# Patient Record
Sex: Male | Born: 1955 | Race: White | Hispanic: No | Marital: Married | Smoking: Never smoker
Health system: Southern US, Community
[De-identification: ages and names within clinical notes are randomized; demographics above are authoritative.]

## PROBLEM LIST (undated history)

## (undated) DIAGNOSIS — R413 Other amnesia: Secondary | ICD-10-CM

## (undated) DIAGNOSIS — K219 Gastro-esophageal reflux disease without esophagitis: Secondary | ICD-10-CM

## (undated) DIAGNOSIS — Z86010 Personal history of colonic polyps: Secondary | ICD-10-CM

## (undated) DIAGNOSIS — I2510 Atherosclerotic heart disease of native coronary artery without angina pectoris: Secondary | ICD-10-CM

## (undated) DIAGNOSIS — E785 Hyperlipidemia, unspecified: Secondary | ICD-10-CM

## (undated) DIAGNOSIS — I10 Essential (primary) hypertension: Secondary | ICD-10-CM

## (undated) DIAGNOSIS — K222 Esophageal obstruction: Secondary | ICD-10-CM

## (undated) DIAGNOSIS — E039 Hypothyroidism, unspecified: Secondary | ICD-10-CM

## (undated) DIAGNOSIS — K5732 Diverticulitis of large intestine without perforation or abscess without bleeding: Secondary | ICD-10-CM

## (undated) DIAGNOSIS — Z86718 Personal history of other venous thrombosis and embolism: Secondary | ICD-10-CM

## (undated) DIAGNOSIS — K4091 Unilateral inguinal hernia, without obstruction or gangrene, recurrent: Secondary | ICD-10-CM

## (undated) DIAGNOSIS — F411 Generalized anxiety disorder: Secondary | ICD-10-CM

## (undated) HISTORY — DX: Esophageal obstruction: K22.2

## (undated) HISTORY — PX: UPPER GASTROINTESTINAL ENDOSCOPY: SHX188

## (undated) HISTORY — PX: POLYPECTOMY: SHX149

## (undated) HISTORY — PX: COLONOSCOPY: SHX174

## (undated) HISTORY — DX: Personal history of other venous thrombosis and embolism: Z86.718

## (undated) HISTORY — PX: HERNIA REPAIR: SHX51

## (undated) HISTORY — DX: Other amnesia: R41.3

## (undated) HISTORY — DX: Hyperlipidemia, unspecified: E78.5

## (undated) HISTORY — DX: Gastro-esophageal reflux disease without esophagitis: K21.9

## (undated) HISTORY — DX: Hypothyroidism, unspecified: E03.9

## (undated) HISTORY — DX: Personal history of colonic polyps: Z86.010

## (undated) HISTORY — PX: PILONIDAL CYST EXCISION: SHX744

## (undated) HISTORY — DX: Diverticulitis of large intestine without perforation or abscess without bleeding: K57.32

## (undated) HISTORY — DX: Essential (primary) hypertension: I10

## (undated) HISTORY — PX: CORONARY ANGIOPLASTY WITH STENT PLACEMENT: SHX49

## (undated) HISTORY — DX: Generalized anxiety disorder: F41.1

## (undated) HISTORY — DX: Atherosclerotic heart disease of native coronary artery without angina pectoris: I25.10

---

## 2002-10-10 ENCOUNTER — Emergency Department (HOSPITAL_COMMUNITY): Admission: EM | Admit: 2002-10-10 | Discharge: 2002-10-10 | Payer: Self-pay | Admitting: Emergency Medicine

## 2003-02-08 HISTORY — PX: CORONARY ANGIOPLASTY WITH STENT PLACEMENT: SHX49

## 2003-10-03 ENCOUNTER — Emergency Department (HOSPITAL_COMMUNITY): Admission: EM | Admit: 2003-10-03 | Discharge: 2003-10-03 | Payer: Self-pay | Admitting: *Deleted

## 2003-10-21 ENCOUNTER — Inpatient Hospital Stay (HOSPITAL_COMMUNITY): Admission: EM | Admit: 2003-10-21 | Discharge: 2003-10-24 | Payer: Self-pay | Admitting: *Deleted

## 2003-10-26 ENCOUNTER — Observation Stay (HOSPITAL_COMMUNITY): Admission: EM | Admit: 2003-10-26 | Discharge: 2003-10-27 | Payer: Self-pay | Admitting: Emergency Medicine

## 2003-11-05 ENCOUNTER — Inpatient Hospital Stay (HOSPITAL_COMMUNITY): Admission: EM | Admit: 2003-11-05 | Discharge: 2003-11-08 | Payer: Self-pay | Admitting: Emergency Medicine

## 2003-11-27 ENCOUNTER — Encounter: Admission: RE | Admit: 2003-11-27 | Discharge: 2003-11-27 | Payer: Self-pay | Admitting: Internal Medicine

## 2003-11-28 ENCOUNTER — Ambulatory Visit (HOSPITAL_COMMUNITY): Admission: RE | Admit: 2003-11-28 | Discharge: 2003-11-28 | Payer: Self-pay | Admitting: Internal Medicine

## 2004-01-26 ENCOUNTER — Encounter: Admission: RE | Admit: 2004-01-26 | Discharge: 2004-01-26 | Payer: Self-pay | Admitting: Internal Medicine

## 2004-06-25 ENCOUNTER — Ambulatory Visit: Payer: Self-pay | Admitting: Internal Medicine

## 2004-07-02 ENCOUNTER — Ambulatory Visit: Payer: Self-pay

## 2004-09-23 ENCOUNTER — Ambulatory Visit: Payer: Self-pay | Admitting: Internal Medicine

## 2004-11-02 ENCOUNTER — Ambulatory Visit: Payer: Self-pay | Admitting: Internal Medicine

## 2004-11-09 ENCOUNTER — Ambulatory Visit: Payer: Self-pay | Admitting: Internal Medicine

## 2005-02-10 ENCOUNTER — Ambulatory Visit: Payer: Self-pay | Admitting: Internal Medicine

## 2005-06-08 ENCOUNTER — Ambulatory Visit: Payer: Self-pay | Admitting: Internal Medicine

## 2005-06-14 ENCOUNTER — Ambulatory Visit: Payer: Self-pay

## 2005-06-23 ENCOUNTER — Ambulatory Visit: Payer: Self-pay | Admitting: Internal Medicine

## 2005-07-25 ENCOUNTER — Ambulatory Visit: Payer: Self-pay | Admitting: Internal Medicine

## 2005-08-02 ENCOUNTER — Ambulatory Visit: Payer: Self-pay | Admitting: Internal Medicine

## 2005-09-07 ENCOUNTER — Ambulatory Visit: Payer: Self-pay | Admitting: Internal Medicine

## 2005-10-07 ENCOUNTER — Ambulatory Visit: Payer: Self-pay | Admitting: Internal Medicine

## 2005-10-13 ENCOUNTER — Ambulatory Visit: Payer: Self-pay | Admitting: Internal Medicine

## 2006-03-09 ENCOUNTER — Ambulatory Visit: Payer: Self-pay | Admitting: Internal Medicine

## 2006-06-14 ENCOUNTER — Ambulatory Visit: Payer: Self-pay | Admitting: Internal Medicine

## 2006-07-31 ENCOUNTER — Ambulatory Visit: Payer: Self-pay | Admitting: Internal Medicine

## 2006-08-02 ENCOUNTER — Encounter: Payer: Self-pay | Admitting: Internal Medicine

## 2006-08-02 DIAGNOSIS — K219 Gastro-esophageal reflux disease without esophagitis: Secondary | ICD-10-CM

## 2006-08-02 DIAGNOSIS — Z86718 Personal history of other venous thrombosis and embolism: Secondary | ICD-10-CM | POA: Insufficient documentation

## 2006-08-02 DIAGNOSIS — I2510 Atherosclerotic heart disease of native coronary artery without angina pectoris: Secondary | ICD-10-CM

## 2006-08-02 DIAGNOSIS — E785 Hyperlipidemia, unspecified: Secondary | ICD-10-CM

## 2006-08-02 DIAGNOSIS — E782 Mixed hyperlipidemia: Secondary | ICD-10-CM

## 2006-08-02 DIAGNOSIS — I25810 Atherosclerosis of coronary artery bypass graft(s) without angina pectoris: Secondary | ICD-10-CM

## 2006-08-02 HISTORY — DX: Gastro-esophageal reflux disease without esophagitis: K21.9

## 2006-08-02 HISTORY — DX: Personal history of other venous thrombosis and embolism: Z86.718

## 2006-08-02 HISTORY — DX: Atherosclerotic heart disease of native coronary artery without angina pectoris: I25.10

## 2006-08-02 HISTORY — DX: Hyperlipidemia, unspecified: E78.5

## 2006-09-11 ENCOUNTER — Encounter: Payer: Self-pay | Admitting: Internal Medicine

## 2006-11-01 ENCOUNTER — Ambulatory Visit: Payer: Self-pay | Admitting: Internal Medicine

## 2006-11-01 DIAGNOSIS — F411 Generalized anxiety disorder: Secondary | ICD-10-CM

## 2006-11-01 HISTORY — DX: Generalized anxiety disorder: F41.1

## 2006-12-01 ENCOUNTER — Ambulatory Visit: Payer: Self-pay | Admitting: Internal Medicine

## 2006-12-21 ENCOUNTER — Ambulatory Visit: Payer: Self-pay | Admitting: Cardiology

## 2006-12-21 ENCOUNTER — Observation Stay (HOSPITAL_COMMUNITY): Admission: EM | Admit: 2006-12-21 | Discharge: 2006-12-21 | Payer: Self-pay | Admitting: Emergency Medicine

## 2006-12-22 ENCOUNTER — Emergency Department (HOSPITAL_COMMUNITY): Admission: EM | Admit: 2006-12-22 | Discharge: 2006-12-23 | Payer: Self-pay | Admitting: Emergency Medicine

## 2006-12-27 ENCOUNTER — Ambulatory Visit: Payer: Self-pay

## 2007-01-05 ENCOUNTER — Ambulatory Visit: Payer: Self-pay | Admitting: Internal Medicine

## 2007-05-02 ENCOUNTER — Telehealth: Payer: Self-pay | Admitting: Internal Medicine

## 2007-05-03 ENCOUNTER — Telehealth: Payer: Self-pay | Admitting: Internal Medicine

## 2007-05-03 ENCOUNTER — Ambulatory Visit: Payer: Self-pay | Admitting: Internal Medicine

## 2007-12-03 ENCOUNTER — Telehealth: Payer: Self-pay | Admitting: Internal Medicine

## 2007-12-25 ENCOUNTER — Ambulatory Visit: Payer: Self-pay | Admitting: Internal Medicine

## 2007-12-25 DIAGNOSIS — F411 Generalized anxiety disorder: Secondary | ICD-10-CM

## 2007-12-25 HISTORY — DX: Generalized anxiety disorder: F41.1

## 2008-01-08 ENCOUNTER — Ambulatory Visit: Payer: Self-pay | Admitting: Internal Medicine

## 2008-08-05 ENCOUNTER — Ambulatory Visit: Payer: Self-pay | Admitting: Internal Medicine

## 2008-08-05 DIAGNOSIS — I10 Essential (primary) hypertension: Secondary | ICD-10-CM

## 2008-08-05 HISTORY — DX: Essential (primary) hypertension: I10

## 2009-03-04 ENCOUNTER — Ambulatory Visit: Payer: Self-pay | Admitting: Family Medicine

## 2009-03-11 ENCOUNTER — Observation Stay (HOSPITAL_COMMUNITY): Admission: EM | Admit: 2009-03-11 | Discharge: 2009-03-12 | Payer: Self-pay | Admitting: Emergency Medicine

## 2009-03-13 ENCOUNTER — Ambulatory Visit: Payer: Self-pay | Admitting: Internal Medicine

## 2009-03-13 DIAGNOSIS — M549 Dorsalgia, unspecified: Secondary | ICD-10-CM

## 2009-03-30 ENCOUNTER — Ambulatory Visit: Payer: Self-pay | Admitting: Internal Medicine

## 2009-05-12 ENCOUNTER — Telehealth: Payer: Self-pay | Admitting: Internal Medicine

## 2009-06-12 ENCOUNTER — Telehealth: Payer: Self-pay | Admitting: Internal Medicine

## 2009-07-13 ENCOUNTER — Telehealth: Payer: Self-pay | Admitting: Internal Medicine

## 2009-07-21 ENCOUNTER — Ambulatory Visit: Payer: Self-pay | Admitting: Family Medicine

## 2009-07-21 DIAGNOSIS — R413 Other amnesia: Secondary | ICD-10-CM

## 2009-07-21 HISTORY — DX: Other amnesia: R41.3

## 2009-07-21 LAB — CONVERTED CEMR LAB
ALT: 16 units/L (ref 0–53)
AST: 17 units/L (ref 0–37)
Albumin: 4.5 g/dL (ref 3.5–5.2)
Alkaline Phosphatase: 56 units/L (ref 39–117)
BUN: 22 mg/dL (ref 6–23)
Basophils Absolute: 0 10*3/uL (ref 0.0–0.1)
Basophils Relative: 0.2 % (ref 0.0–3.0)
Bilirubin, Direct: 0.1 mg/dL (ref 0.0–0.3)
CO2: 27 meq/L (ref 19–32)
Calcium: 9.8 mg/dL (ref 8.4–10.5)
Chloride: 107 meq/L (ref 96–112)
Creatinine, Ser: 1.4 mg/dL (ref 0.4–1.5)
Eosinophils Absolute: 0.1 10*3/uL (ref 0.0–0.7)
Eosinophils Relative: 0.9 % (ref 0.0–5.0)
GFR calc non Af Amer: 54.9 mL/min (ref >60)
Glucose, Bld: 76 mg/dL (ref 70–99)
HCT: 47.3 % (ref 39.0–52.0)
Hemoglobin: 16.3 g/dL (ref 13.0–17.0)
Lymphocytes Relative: 17.8 % (ref 12.0–46.0)
Lymphs Abs: 1.6 10*3/uL (ref 0.7–4.0)
MCHC: 34.5 g/dL (ref 30.0–36.0)
MCV: 93.1 fL (ref 78.0–100.0)
Monocytes Absolute: 0.6 10*3/uL (ref 0.1–1.0)
Monocytes Relative: 6.9 % (ref 3.0–12.0)
Neutro Abs: 6.8 10*3/uL (ref 1.4–7.7)
Neutrophils Relative %: 74.2 % (ref 43.0–77.0)
Phosphorus: 3.5 mg/dL (ref 2.3–4.6)
Platelets: 305 10*3/uL (ref 150.0–400.0)
Potassium: 4.6 meq/L (ref 3.5–5.1)
RBC: 5.08 M/uL (ref 4.22–5.81)
RDW: 13.8 % (ref 11.5–14.6)
Sed Rate: 10 mm/hr (ref 0–22)
Sodium: 145 meq/L (ref 135–145)
TSH: 1.18 microintl units/mL (ref 0.35–5.50)
Total Bilirubin: 0.4 mg/dL (ref 0.3–1.2)
Total Protein: 7.4 g/dL (ref 6.0–8.3)
WBC: 9.2 10*3/uL (ref 4.5–10.5)

## 2010-01-06 ENCOUNTER — Ambulatory Visit: Payer: Self-pay | Admitting: Family Medicine

## 2010-01-06 ENCOUNTER — Telehealth: Payer: Self-pay

## 2010-01-06 DIAGNOSIS — K5732 Diverticulitis of large intestine without perforation or abscess without bleeding: Secondary | ICD-10-CM

## 2010-01-06 HISTORY — DX: Diverticulitis of large intestine without perforation or abscess without bleeding: K57.32

## 2010-01-06 LAB — CONVERTED CEMR LAB
Bilirubin Urine: NEGATIVE
Blood in Urine, dipstick: NEGATIVE
Glucose, Urine, Semiquant: NEGATIVE
Nitrite: NEGATIVE
Specific Gravity, Urine: 1.015
Urobilinogen, UA: 0.2
WBC Urine, dipstick: NEGATIVE
pH: 6.5

## 2010-01-07 LAB — CONVERTED CEMR LAB
ALT: 20 units/L (ref 0–53)
AST: 18 units/L (ref 0–37)
Albumin: 4.3 g/dL (ref 3.5–5.2)
Alkaline Phosphatase: 52 units/L (ref 39–117)
Amylase: 21 units/L — ABNORMAL LOW (ref 27–131)
BUN: 18 mg/dL (ref 6–23)
Basophils Absolute: 0 10*3/uL (ref 0.0–0.1)
Basophils Relative: 0.3 % (ref 0.0–3.0)
Bilirubin, Direct: 0.1 mg/dL (ref 0.0–0.3)
CO2: 26 meq/L (ref 19–32)
Calcium: 9.7 mg/dL (ref 8.4–10.5)
Chloride: 106 meq/L (ref 96–112)
Creatinine, Ser: 1.3 mg/dL (ref 0.4–1.5)
Eosinophils Absolute: 0.1 10*3/uL (ref 0.0–0.7)
Eosinophils Relative: 0.7 % (ref 0.0–5.0)
GFR calc non Af Amer: 61.78 mL/min (ref >60)
Glucose, Bld: 75 mg/dL (ref 70–99)
HCT: 44.7 % (ref 39.0–52.0)
Hemoglobin: 15.7 g/dL (ref 13.0–17.0)
Lymphocytes Relative: 14.6 % (ref 12.0–46.0)
Lymphs Abs: 1.4 10*3/uL (ref 0.7–4.0)
MCHC: 35.1 g/dL (ref 30.0–36.0)
MCV: 92.1 fL (ref 78.0–100.0)
Monocytes Absolute: 0.7 10*3/uL (ref 0.1–1.0)
Monocytes Relative: 7.2 % (ref 3.0–12.0)
Neutro Abs: 7.3 10*3/uL (ref 1.4–7.7)
Neutrophils Relative %: 77.2 % — ABNORMAL HIGH (ref 43.0–77.0)
Platelets: 408 10*3/uL — ABNORMAL HIGH (ref 150.0–400.0)
Potassium: 5.3 meq/L — ABNORMAL HIGH (ref 3.5–5.1)
RBC: 4.86 M/uL (ref 4.22–5.81)
RDW: 13.7 % (ref 11.5–14.6)
Sodium: 143 meq/L (ref 135–145)
Total Bilirubin: 0.6 mg/dL (ref 0.3–1.2)
Total Protein: 7.5 g/dL (ref 6.0–8.3)
WBC: 9.4 10*3/uL (ref 4.5–10.5)

## 2010-02-09 ENCOUNTER — Encounter (INDEPENDENT_AMBULATORY_CARE_PROVIDER_SITE_OTHER): Payer: Self-pay | Admitting: *Deleted

## 2010-02-28 ENCOUNTER — Encounter: Payer: Self-pay | Admitting: Family Medicine

## 2010-03-09 NOTE — Assessment & Plan Note (Signed)
Summary: eph/per aft hrs sheet/jss  Medications Added SIMVASTATIN 40 MG  TABS (SIMVASTATIN) 1 tab once daily LISINOPRIL 10 MG TABS (LISINOPRIL) as needed pt monitors his own blood pressure ASPIRIN EC 325 MG TBEC (ASPIRIN) Take one tablet by mouth daily      Allergies Added:   Visit Type:  Follow-up Primary Provider:  Gordy Savers  MD  CC:  no complaints.  History of Present Illness: Patient is a 55 year old with a history of CAD (s/p catheterization in 2005 with DES to occluded RCA).  Myoview in 2008 without ischemia. The patient was last in clinic a couple years ago.  He was recently admitted to [facility] with chest pan. the patient said he had not been feeling well the day before admit.  He had helped his boss move a TV into his home.  He went to bed early after a light dinner.  About 1 AM  he was still fidgety.  Ge becam dizzy, shrot of breath.  Laid down on the floor.  Becam very dizzy  Developed L sided chest pressure.  His wife called EMS.  When they came he says his blood pressure was 60/. He was taken to the hospital.  ER records were not obtainable.  He was aditted.  Ruled out for MI. Pain in his chest was felt to be musculoskeletal.  He was d/cd home on lisinopril and on Zocor (he had stopped meds;  took lisinopril as needed for increased bp) Since d/c he denies chest pain.  Breathing is ok.  Current Medications (verified): 1)  Prilosec Otc 20 Mg  Tbec (Omeprazole Magnesium) .Marland Kitchen.. 1qd 2)  Lorazepam 0.5 Mg  Tabs (Lorazepam) .... One Every 6 Hours As Needed For Anxiety 3)  Simvastatin 40 Mg  Tabs (Simvastatin) .Marland Kitchen.. 1 Tab Once Daily 4)  Lisinopril 10 Mg Tabs (Lisinopril) .... As Needed Pt Monitors His Own Blood Pressure 5)  Aspirin Ec 325 Mg Tbec (Aspirin) .... Take One Tablet By Mouth Daily  Allergies (verified): 1)  Penicillin V Potassium (Penicillin V Potassium)  Past History:  Past Medical History: Last updated: 08/05/2008 Coronary artery disease DVT, hx  of Hyperlipidemia GERD Anxiety Hypertension  Family History: Last updated: November 04, 2006 father died age 87 colon cancer.  mother that her mid 20s of dementia.  Two half-brothers one sister in good health  Social History: Last updated: 12/25/2007 Married  Vital Signs:  Patient profile:   55 year old male Height:      70 inches Weight:      205 pounds BMI:     29.52 Pulse rate:   85 / minute BP sitting:   147 / 97  (left arm) Cuff size:   regular  Vitals Entered By: Burnett Kanaris, CNA (March 30, 2009 11:17 AM)  Physical Exam  Additional Exam:  patient is in NAD HEENT:  Normocephalic, atraumatic. EOMI, PERRLA.  Neck: JVP is normal. No thyromegaly. No bruits.  Lungs: clear to auscultation. No rales no wheezes.  Heart: Regular rate and rhythm. Normal S1, S2. No S3.   No significant murmurs. PMI not displaced.  Abdomen:  Supple, nontender. Normal bowel sounds. No masses. No hepatomegaly.  Extremities:   Good distal pulses throughout. No lower extremity edema.  Musculoskeletal :moving all extremities.  Neuro:   alert and oriented x3.    EKG  Procedure date:  03/30/2009  Findings:      NSR.  70 bpm.  Impression & Recommendations:  Problem # 1:  CORONARY ARTERY  DISEASE (ICD-414.00)  I do not think the recent episode of chest pain represents ischemia. I would continue on medical Rx.  Problem # 2:  HYPERLIPIDEMIA (ICD-272.4) Patient needs to be on a statin.  Continue simvistatin.  Check lipids in 8 wls. His updated medication list for this problem includes:    Simvastatin 40 Mg Tabs (Simvastatin) .Marland Kitchen... 1 tab once daily  Problem # 3:  HYPERTENSION (ICD-401.9) BP is labile   He takes lisinopril as needed.  Has had problems with hypotension at times. I would recomm low dose Toprol.  I have asked him to send in his bp readings in about one month.  Other Orders: EKG w/ Interpretation (93000)  Patient Instructions: 1)  Your physician recommends that you return for a  FASTING lipid profile: Schedule for Fasting Lipid and AST 272.0  in April 2011 2)  Your physician recommends that you schedule a follow-up appointment in: Nurse visit for BP check..same day as Lab work 3)  Your physician wants you to follow-up in:  December 2011 You will receive a reminder letter in the mail two months in advance. If you don't receive a letter, please call our office to schedule the follow-up appointment.

## 2010-03-09 NOTE — Assessment & Plan Note (Signed)
Summary: LOWER BACK PAIN/OUT OF HOSP THURS/RCD   Vital Signs:  Patient profile:   55 year old male Weight:      191 pounds Temp:     99.3 degrees F oral BP sitting:   120 / 80  (left arm) Cuff size:   regular CC: dc'd 2/3 from hosp. , lower back and (l) hip pain   CC:  dc'd 2/3 from hosp.  and lower back and (l) hip pain.  History of Present Illness: 55 year old patient who is seen today for follow-up after a hospital discharge.  Yesterday.  He was admitted for evaluation of atypical chest pain.  He is scheduled for cardiology follow-up next week.  He has had no recurrent chest pain.  He does have left lower back and buttock discomfort.  This has occurred following heavy lifting.  He is had no recurrent chest pain.  History of hypertension and dyslipidemia.  During the  hospitalization, his simvastatin was resumed.  he has a history of anxiety, depression, which has been stable.  hospital records reviewed  Allergies: 1)  Penicillin V Potassium (Penicillin V Potassium)  Past History:  Past Medical History: Reviewed history from 08/05/2008 and no changes required. Coronary artery disease DVT, hx of Hyperlipidemia GERD Anxiety Hypertension  Past Surgical History: Reviewed history from 08/05/2008 and no changes required. PTCA/stent 2007  Review of Systems       The patient complains of chest pain and difficulty walking.  The patient denies anorexia, fever, weight loss, weight gain, vision loss, decreased hearing, hoarseness, syncope, dyspnea on exertion, peripheral edema, prolonged cough, headaches, hemoptysis, abdominal pain, melena, hematochezia, severe indigestion/heartburn, hematuria, incontinence, genital sores, muscle weakness, suspicious skin lesions, transient blindness, depression, unusual weight change, abnormal bleeding, enlarged lymph nodes, angioedema, breast masses, and testicular masses.    Physical Exam  General:  Well-developed,well-nourished,in no acute  distress; alert,appropriate and cooperative throughout examination Head:  Normocephalic and atraumatic without obvious abnormalities. No apparent alopecia or balding. Eyes:  No corneal or conjunctival inflammation noted. EOMI. Perrla. Funduscopic exam benign, without hemorrhages, exudates or papilledema. Vision grossly normal. Mouth:  Oral mucosa and oropharynx without lesions or exudates.  Teeth in good repair. Neck:  No deformities, masses, or tenderness noted. Lungs:  Normal respiratory effort, chest expands symmetrically. Lungs are clear to auscultation, no crackles or wheezes. Heart:  Normal rate and regular rhythm. S1 and S2 normal without gallop, murmur, click, rub or other extra sounds. Abdomen:  Bowel sounds positive,abdomen soft and non-tender without masses, organomegaly or hernias noted. Msk:  straight leg testing negative no motor weakness reflexes intact Pulses:  R and L carotid,radial,femoral,dorsalis pedis and posterior tibial pulses are full and equal bilaterally Extremities:  No clubbing, cyanosis, edema, or deformity noted with normal full range of motion of all joints.     Impression & Recommendations:  Problem # 1:  BACK PAIN (ICD-724.5)  The following medications were removed from the medication list:    Aspirin 81 Mg Tbec (Aspirin) .Marland Kitchen... Take 1 tablet by mouth once a day His updated medication list for this problem includes:    Bufferin 325 Mg Tabs (Aspirin buf(cacarb-mgcarb-mgo)) ..... Qd  Problem # 2:  HYPERTENSION (ICD-401.9)  His updated medication list for this problem includes:    Lisinopril-hydrochlorothiazide 20-25 Mg Tabs (Lisinopril-hydrochlorothiazide) ..... One daily  Problem # 3:  HYPERLIPIDEMIA (ICD-272.4)  His updated medication list for this problem includes:    Simvastatin 40 Mg Tabs (Simvastatin) ..... One daily  Problem # 4:  CORONARY ARTERY  DISEASE (ICD-414.00)  The following medications were removed from the medication list:    Aspirin  81 Mg Tbec (Aspirin) .Marland Kitchen... Take 1 tablet by mouth once a day His updated medication list for this problem includes:    Lisinopril-hydrochlorothiazide 20-25 Mg Tabs (Lisinopril-hydrochlorothiazide) ..... One daily    Bufferin 325 Mg Tabs (Aspirin buf(cacarb-mgcarb-mgo)) ..... Qd follow-up cardiology as scheduled  Complete Medication List: 1)  Prilosec Otc 20 Mg Tbec (Omeprazole magnesium) .Marland Kitchen.. 1qd 2)  Lorazepam 0.5 Mg Tabs (Lorazepam) .... One every 6 hours as needed for anxiety 3)  Buspirone Hcl 15 Mg Tabs (Buspirone hcl) .... One twice daily 4)  Simvastatin 40 Mg Tabs (Simvastatin) .... One daily 5)  Lisinopril-hydrochlorothiazide 20-25 Mg Tabs (Lisinopril-hydrochlorothiazide) .... One daily 6)  Bufferin 325 Mg Tabs (Aspirin buf(cacarb-mgcarb-mgo)) .... Qd  Patient Instructions: 1)  You may move around but avoid painful motions. Apply  heat  to sore area for 20 minutes 3-4 times a day for 2-3 days. 2)  Please schedule a follow-up appointment in 3 months. 3)  Limit your Sodium (Salt).

## 2010-03-09 NOTE — Assessment & Plan Note (Signed)
Summary: n/v/d/ , chills , lower abd pain   KIK   Vital Signs:  Patient profile:   55 year old male Weight:      191 pounds BMI:     27.50 O2 Sat:      78 % Temp:     98.8 degrees F Pulse rate:   78 / minute BP sitting:   130 / 96  (left arm)  Vitals Entered By: Pura Spice, RN (January 06, 2010 10:37 AM) CC: no vomiting since monday c/o nausea headache achy neck and groin area constipation and flatus.    History of Present Illness: Here for 4 days of feeling bad with a variety of symptoms. He is very fatigued, and he has laid around his house and has been unable to work this week. He has been nauseated intermittently but has vomited only once.  He has felt chills but has not had a measurable fever. He has had mild generalized abdominal cramps but no pain per se. He has been mildly constipated, and has had no diarrhea at all. No urinary symtpoms. He had a couple of abcessed teeth and actually has 2 teeth extracted by Dr. Warren Danes a week ago. He took a 10 day course of Clindamycin, the last dose being 3 days ago.   Allergies: 1)  Penicillin V Potassium (Penicillin V Potassium)  Past History:  Past Medical History: Reviewed history from 08/05/2008 and no changes required. Coronary artery disease DVT, hx of Hyperlipidemia GERD Anxiety Hypertension  Past Surgical History: Reviewed history from 08/05/2008 and no changes required. PTCA/stent 2007  Review of Systems  The patient denies anorexia, fever, weight loss, weight gain, vision loss, decreased hearing, hoarseness, chest pain, syncope, dyspnea on exertion, peripheral edema, prolonged cough, headaches, hemoptysis, melena, hematochezia, severe indigestion/heartburn, hematuria, incontinence, genital sores, muscle weakness, suspicious skin lesions, transient blindness, difficulty walking, depression, unusual weight change, abnormal bleeding, enlarged lymph nodes, angioedema, breast masses, and testicular masses.    Physical  Exam  General:  Well-developed,well-nourished,in no acute distress; alert,appropriate and cooperative throughout examination Lungs:  Normal respiratory effort, chest expands symmetrically. Lungs are clear to auscultation, no crackles or wheezes. Heart:  Normal rate and regular rhythm. S1 and S2 normal without gallop, murmur, click, rub or other extra sounds. Abdomen:  soft, normal bowel sounds, no distention, no masses, no guarding, no rigidity, no rebound tenderness, no abdominal hernia, no inguinal hernia, no hepatomegaly, and no splenomegaly.  Mildly tender in the LLQ.    Impression & Recommendations:  Problem # 1:  DIVERTICULITIS OF COLON (ICD-562.11)  Orders: Venipuncture (10272) TLB-BMP (Basic Metabolic Panel-BMET) (80048-METABOL) TLB-CBC Platelet - w/Differential (85025-CBCD) TLB-Hepatic/Liver Function Pnl (80076-HEPATIC) TLB-Amylase (82150-AMYL) UA Dipstick w/o Micro (manual) (53664) Specimen Handling (99000) Gastroenterology Referral (GI)  Complete Medication List: 1)  Prilosec Otc 20 Mg Tbec (Omeprazole magnesium) .Marland Kitchen.. 1qd 2)  Lorazepam 0.5 Mg Tabs (Lorazepam) .... One every 6 hours as needed for anxiety 3)  Simvastatin 40 Mg Tabs (Simvastatin) .Marland Kitchen.. 1 tab once daily 4)  Lisinopril 10 Mg Tabs (Lisinopril) .... As needed pt monitors his own blood pressure 5)  Aspirin Ec 325 Mg Tbec (Aspirin) .... Take one tablet by mouth daily 6)  Ciprofloxacin Hcl 500 Mg Tabs (Ciprofloxacin hcl) .... Two times a day  Patient Instructions: 1)  This is consistent with either a viral gastroenteritis or a mild diverticulitis. He has never had a colonoscopy. We will cover him with Cipro and draw some labs. rest, drink fluids.  2)  Schedule a  colonoscopy/ sigmoidoscopy to help detect colon cancer.  Prescriptions: CIPROFLOXACIN HCL 500 MG TABS (CIPROFLOXACIN HCL) two times a day  #20 x 0   Entered and Authorized by:   Nelwyn Salisbury MD   Signed by:   Nelwyn Salisbury MD on 01/06/2010   Method used:    Electronically to        [facility]       [address]       [place], [place]  [address]       Ph: [phone]       Fax: [phone]   RxID:   [id number]    Orders Added: 1)  Venipuncture [84132] 2)  TLB-BMP (Basic Metabolic Panel-BMET) [80048-METABOL] 3)  TLB-CBC Platelet - w/Differential [85025-CBCD] 4)  TLB-Hepatic/Liver Function Pnl [80076-HEPATIC] 5)  TLB-Amylase [82150-AMYL] 6)  Est. Patient Level IV [44010] 7)  UA Dipstick w/o Micro (manual) [81002] 8)  Specimen Handling [99000] 9)  Gastroenterology Referral [GI]    Laboratory Results   Urine Tests  Date/Time Received: January 06, 2010 11:26 AM Date/Time Reported: 11:26 AM   Routine Urinalysis   Color: yellow Appearance: Clear Glucose: negative   (Normal Range: Negative) Bilirubin: negative   (Normal Range: Negative) Ketone: small (15)   (Normal Range: Negative) Spec. Gravity: 1.015   (Normal Range: 1.003-1.035) Blood: negative   (Normal Range: Negative) pH: 6.5   (Normal Range: 5.0-8.0) Protein: trace   (Normal Range: Negative) Urobilinogen: 0.2   (Normal Range: 0-1) Nitrite: negative   (Normal Range: Negative) Leukocyte Esterace: negative   (Normal Range: Negative)    Comments: Pura Spice, RN  January 06, 2010 11:27 AM
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
## 2010-03-09 NOTE — Assessment & Plan Note (Signed)
Summary: HIGH BP/RCD   Vital Signs:  Patient profile:   55 year old male Temp:     98.7 degrees F oral BP sitting:   144 / 98  (left arm) Cuff size:   regular  Vitals Entered By: Sid Falcon LPN (March 04, 2009 2:05 PM) CC: Walk-in, elevated BP   History of Present Illness: Acute visit. Patient has history of hypertension and CAD. Is on lisinopril HCTZ 20/25 but has not been taking very often recently. He has had concerns that current dose dropped his blood pressure too low with some dizziness in the past. Also not taking simvastatin because of concerns about adverse side effects. Denies any recent chest pains or dizziness. Has had intermittent mild headaches. Home blood pressures are ranging 160-170 systolic and 90-100 diastolic past few days.  Allergies: 1)  Penicillin V Potassium (Penicillin V Potassium)  Past History:  Past Medical History: Last updated: 08/05/2008 Coronary artery disease DVT, hx of Hyperlipidemia GERD Anxiety Hypertension  Past Surgical History: Last updated: 08/05/2008 PTCA/stent 2007  Review of Systems  The patient denies chest pain, syncope, dyspnea on exertion, peripheral edema, and prolonged cough.    Physical Exam  General:  Well-developed,well-nourished,in no acute distress; alert,appropriate and cooperative throughout examination Eyes:  No corneal or conjunctival inflammation noted. EOMI. Perrla. Funduscopic exam benign, without hemorrhages, exudates or papilledema. Vision grossly normal. Neck:  No deformities, masses, or tenderness noted. Lungs:  Normal respiratory effort, chest expands symmetrically. Lungs are clear to auscultation, no crackles or wheezes. Heart:  Normal rate and regular rhythm. S1 and S2 normal without gallop, murmur, click, rub or other extra sounds.   Impression & Recommendations:  Problem # 1:  HYPERTENSION (ICD-401.9) we have suggested that patient start back lisinopril HCTZ at one half tablet daily and take  this on regular daily basis with close followup with his primary physician within the next few weeks to reassess His updated medication list for this problem includes:    Lisinopril-hydrochlorothiazide 20-25 Mg Tabs (Lisinopril-hydrochlorothiazide) ..... One daily  Complete Medication List: 1)  Aspirin 81 Mg Tbec (Aspirin) .... Take 1 tablet by mouth once a day 2)  Prilosec Otc 20 Mg Tbec (Omeprazole magnesium) .Marland Kitchen.. 1qd 3)  Lorazepam 0.5 Mg Tabs (Lorazepam) .... One every 6 hours as needed for anxiety 4)  Buspirone Hcl 15 Mg Tabs (Buspirone hcl) .... One twice daily 5)  Simvastatin 40 Mg Tabs (Simvastatin) .... One daily 6)  Lisinopril-hydrochlorothiazide 20-25 Mg Tabs (Lisinopril-hydrochlorothiazide) .... One daily  Patient Instructions: 1)  Take lisinopril HCTZ 20/12.5 one half tablet daily and record home blood pressures. 2)  Schedule followup with your primary physician within the next 2-3 weeks 3)  Limit your Sodium(salt) .

## 2010-03-09 NOTE — Progress Notes (Signed)
Summary:  refill of Lorazepam   Phone Note Call from Patient Call back at Work Phone [phone]   Caller: Patient Summary of Call: Pt called and said that [facility] on [address] sent a refill req for Lorazepam 0.5mg . Please call in asap. Pt is completely out of med. Initial call taken by: Lucy Antigua,  May 12, 2009 10:17 AM    Prescriptions: LORAZEPAM 0.5 MG  TABS (LORAZEPAM) one every 6 hours as needed for anxiety  #120 x 1   Entered by:   Duard Brady LPN   Authorized by:   Gordy Savers  MD   Signed by:   Duard Brady LPN on 34/74/2595   Method used:   Historical   RxID:   [id number]  [facility]

## 2010-03-09 NOTE — Progress Notes (Signed)
Summary: refill lorazopam  Phone Note Refill Request Message from:  Fax from Pharmacy on July 13, 2009 12:22 PM  Refills Requested: Medication #1:  LORAZEPAM 0.5 MG  TABS one every 6 hours as needed for anxiety   Last Refilled: 06/10/2009 [facility] [phone]   Method Requested: Telephone to Pharmacy Initial call taken by: Duard Brady LPN,  July 13, 1608 12:22 PM    Prescriptions: LORAZEPAM 0.5 MG  TABS (LORAZEPAM) one every 6 hours as needed for anxiety  #120 x 1   Entered by:   Duard Brady LPN   Authorized by:   Gordy Savers  MD   Signed by:   Duard Brady LPN on 96/05/5407   Method used:   Historical   RxID:   [id number]  [facility] to [facility] KIK

## 2010-03-09 NOTE — Progress Notes (Signed)
Summary: no call no show 3 mos rov  no call no show  for 3 mos rov - called pt - he forgot he had appt - wants to r/s   - tranfer to scheduling KIK

## 2010-03-09 NOTE — Progress Notes (Signed)
Summary: lower abd pain  Phone Note Call from Patient Call back at [phone]   Caller: Patient Call For: Gordy Savers  MD Reason for Call: Talk to Nurse, Talk to Doctor Summary of Call: patient is calling complaining of NVD, chills, lower abdominal pain.  On set Sunday.  Patient also had dental work done before Thanksgiving.  Patient would like to be worked in today if possible. Initial call taken by: Kern Reap CMA Duncan Dull),  January 06, 2010 8:46 AM  Follow-up for Phone Call        spoke with pt after speaking with Dr. Frederica Kuster - needs to be seen - will have to be seen by another dr. - pt ok with that. appt made with Dr. Gracelyn Nurse Follow-up by: Duard Brady LPN,  January 06, 2010 8:59 AM

## 2010-03-11 NOTE — Letter (Signed)
Summary: Referral - not able to see patient  [facility]  [address] [place], [place] [address]   Phone: [phone]  Fax: [phone]    February 09, 2010   Tera Mater. Clent Ridges, M.D. [address] [place], [place] [address]    Re:   [name] DOB:  [birth year] MRN:   [id number]    Dear Dr. Clent Ridges:  Thank you for your kind referral of the above patient.  We have attempted to schedule the recommended procedure Screening Colonoscopy but have not been able to schedule because:   X  The patient was not available by phone and/or has not returned our calls.  ___ The patient declined to schedule the procedure at this time.  We appreciate the referral and hope that we will have the opportunity to treat this patient in the future.    Sincerely,    [facility] [phone]

## 2010-04-28 LAB — BASIC METABOLIC PANEL
BUN: 25 mg/dL — ABNORMAL HIGH (ref 6–23)
CO2: 27 mEq/L (ref 19–32)
Calcium: 8.8 mg/dL (ref 8.4–10.5)
Chloride: 98 mEq/L (ref 96–112)
Creatinine, Ser: 1.29 mg/dL (ref 0.4–1.5)
GFR calc Af Amer: >60 mL/min (ref >60)
GFR calc non Af Amer: 58 mL/min — ABNORMAL LOW (ref >60)
Glucose, Bld: 111 mg/dL — ABNORMAL HIGH (ref 70–99)
Potassium: 4.8 mEq/L (ref 3.5–5.1)
Sodium: 130 mEq/L — ABNORMAL LOW (ref 135–145)

## 2010-04-28 LAB — URINALYSIS, ROUTINE W REFLEX MICROSCOPIC
Bilirubin Urine: NEGATIVE
Glucose, UA: NEGATIVE mg/dL
Hgb urine dipstick: NEGATIVE
Ketones, ur: NEGATIVE mg/dL
Nitrite: NEGATIVE
Protein, ur: NEGATIVE mg/dL
Specific Gravity, Urine: 1.031 — ABNORMAL HIGH (ref 1.005–1.030)
Urobilinogen, UA: 0.2 mg/dL (ref 0.0–1.0)
pH: 5.5 (ref 5.0–8.0)

## 2010-04-28 LAB — DIFFERENTIAL
Basophils Absolute: 0 10*3/uL (ref 0.0–0.1)
Basophils Relative: 0 % (ref 0–1)
Eosinophils Absolute: 0.1 10*3/uL (ref 0.0–0.7)
Eosinophils Relative: 1 % (ref 0–5)
Lymphocytes Relative: 15 % (ref 12–46)
Lymphs Abs: 1.6 10*3/uL (ref 0.7–4.0)
Monocytes Absolute: 0.8 10*3/uL (ref 0.1–1.0)
Monocytes Relative: 8 % (ref 3–12)
Neutro Abs: 8.2 10*3/uL — ABNORMAL HIGH (ref 1.7–7.7)
Neutrophils Relative %: 75 % (ref 43–77)

## 2010-04-28 LAB — COMPREHENSIVE METABOLIC PANEL
ALT: 26 U/L (ref 0–53)
AST: 21 U/L (ref 0–37)
Albumin: 4.2 g/dL (ref 3.5–5.2)
Alkaline Phosphatase: 45 U/L (ref 39–117)
BUN: 30 mg/dL — ABNORMAL HIGH (ref 6–23)
CO2: 22 mEq/L (ref 19–32)
Calcium: 9 mg/dL (ref 8.4–10.5)
Chloride: 102 mEq/L (ref 96–112)
Creatinine, Ser: 1.26 mg/dL (ref 0.4–1.5)
GFR calc Af Amer: >60 mL/min (ref >60)
GFR calc non Af Amer: 59 mL/min — ABNORMAL LOW (ref >60)
Glucose, Bld: 110 mg/dL — ABNORMAL HIGH (ref 70–99)
Potassium: 3.6 mEq/L (ref 3.5–5.1)
Sodium: 133 mEq/L — ABNORMAL LOW (ref 135–145)
Total Bilirubin: 0.9 mg/dL (ref 0.3–1.2)
Total Protein: 6.8 g/dL (ref 6.0–8.3)

## 2010-04-28 LAB — CARDIAC PANEL(CRET KIN+CKTOT+MB+TROPI)
CK, MB: 1.5 ng/mL (ref 0.3–4.0)
CK, MB: 1.5 ng/mL (ref 0.3–4.0)
Relative Index: 0.9 (ref 0.0–2.5)
Relative Index: 1.5 (ref 0.0–2.5)
Total CK: 102 U/L (ref 7–232)
Total CK: 167 U/L (ref 7–232)
Troponin I: 0.04 ng/mL (ref 0.00–0.06)
Troponin I: 0.04 ng/mL (ref 0.00–0.06)

## 2010-04-28 LAB — LIPID PANEL
Cholesterol: 246 mg/dL — ABNORMAL HIGH (ref 0–200)
HDL: 40 mg/dL (ref >39)
LDL Cholesterol: 172 mg/dL — ABNORMAL HIGH (ref 0–99)
Total CHOL/HDL Ratio: 6.2 RATIO
Triglycerides: 172 mg/dL — ABNORMAL HIGH (ref <150)
VLDL: 34 mg/dL (ref 0–40)

## 2010-04-28 LAB — RAPID URINE DRUG SCREEN, HOSP PERFORMED
Amphetamines: NOT DETECTED
Barbiturates: NOT DETECTED
Benzodiazepines: NOT DETECTED
Cocaine: NOT DETECTED
Opiates: NOT DETECTED
Tetrahydrocannabinol: POSITIVE — AB

## 2010-04-28 LAB — CBC
HCT: 43.1 % (ref 39.0–52.0)
HCT: 43.8 % (ref 39.0–52.0)
Hemoglobin: 14.6 g/dL (ref 13.0–17.0)
Hemoglobin: 14.8 g/dL (ref 13.0–17.0)
MCHC: 33.4 g/dL (ref 30.0–36.0)
MCHC: 34.4 g/dL (ref 30.0–36.0)
MCV: 93.2 fL (ref 78.0–100.0)
MCV: 94.2 fL (ref 78.0–100.0)
Platelets: 238 10*3/uL (ref 150–400)
RBC: 4.58 MIL/uL (ref 4.22–5.81)
RBC: 4.7 MIL/uL (ref 4.22–5.81)
RDW: 13 % (ref 11.5–15.5)
WBC: 10.8 10*3/uL — ABNORMAL HIGH (ref 4.0–10.5)

## 2010-04-28 LAB — TSH: TSH: 0.96 u[IU]/mL (ref 0.350–4.500)

## 2010-04-28 LAB — CK TOTAL AND CKMB (NOT AT ARMC)
CK, MB: 1.5 ng/mL (ref 0.3–4.0)
Relative Index: 1.3 (ref 0.0–2.5)
Total CK: 117 U/L (ref 7–232)

## 2010-04-28 LAB — TROPONIN I: Troponin I: 0.02 ng/mL (ref 0.00–0.06)

## 2010-04-28 LAB — ETHANOL: Alcohol, Ethyl (B): <5 mg/dL (ref 0–10)

## 2010-04-29 ENCOUNTER — Ambulatory Visit (INDEPENDENT_AMBULATORY_CARE_PROVIDER_SITE_OTHER): Payer: BC Managed Care – PPO | Admitting: Internal Medicine

## 2010-04-29 ENCOUNTER — Encounter: Payer: Self-pay | Admitting: Internal Medicine

## 2010-04-29 DIAGNOSIS — E785 Hyperlipidemia, unspecified: Secondary | ICD-10-CM

## 2010-04-29 DIAGNOSIS — K625 Hemorrhage of anus and rectum: Secondary | ICD-10-CM

## 2010-04-29 DIAGNOSIS — I2510 Atherosclerotic heart disease of native coronary artery without angina pectoris: Secondary | ICD-10-CM

## 2010-04-29 DIAGNOSIS — I10 Essential (primary) hypertension: Secondary | ICD-10-CM

## 2010-04-29 MED ORDER — SIMVASTATIN 40 MG PO TABS: 40.0000 mg | ORAL_TABLET | Freq: Every day | ORAL | Status: DC

## 2010-04-29 MED ORDER — OMEPRAZOLE 20 MG PO CPDR: 20.0000 mg | DELAYED_RELEASE_CAPSULE | Freq: Every day | ORAL | Status: DC

## 2010-04-29 MED ORDER — LISINOPRIL 10 MG PO TABS: 10.0000 mg | ORAL_TABLET | Freq: Every day | ORAL | Status: DC

## 2010-04-29 NOTE — Progress Notes (Signed)
  Subjective:    Patient ID: [name], male    DOB: Feb 12, 1955, 55 y.o.   MRN: [id number]  HPI   55 year old patient presents with a three-day history of small volume bright red rectal bleeding. He has had no prior colonoscopy although his father did die of colon cancer in his 67s. He has treated hypertension as well as coronary artery disease and dyslipidemia area in apparently he has been off his simvastatin. He has not been seen for an office visit in some time. Denies any abdominal pain weight loss or change in his bowel habits   Review of Systems  Constitutional: Negative for fever, chills, appetite change and fatigue.  HENT: Negative for hearing loss, ear pain, congestion, sore throat, trouble swallowing, neck stiffness, dental problem, voice change and tinnitus.   Eyes: Negative for pain, discharge and visual disturbance.  Respiratory: Negative for cough, chest tightness, wheezing and stridor.   Cardiovascular: Negative for chest pain, palpitations and leg swelling.  Gastrointestinal: Positive for blood in stool and anal bleeding. Negative for nausea, vomiting, abdominal pain, diarrhea, constipation, abdominal distention and rectal pain.  Genitourinary: Negative for urgency, hematuria, flank pain, discharge, difficulty urinating and genital sores.  Musculoskeletal: Negative for myalgias, back pain, joint swelling, arthralgias and gait problem.  Skin: Negative for rash.  Neurological: Negative for dizziness, syncope, speech difficulty, weakness, numbness and headaches.  Hematological: Negative for adenopathy. Does not bruise/bleed easily.  Psychiatric/Behavioral: Negative for behavioral problems and dysphoric mood. The patient is not nervous/anxious.        Objective:   Physical Exam  Constitutional: He is oriented to person, place, and time. He appears well-developed and well-nourished. No distress.        Blood pressure 134/90  HENT:  Head: Normocephalic.  Right Ear:  External ear normal.  Left Ear: External ear normal.  Eyes: Conjunctivae and EOM are normal.  Neck: Normal range of motion.  Cardiovascular: Normal rate and normal heart sounds.   Pulmonary/Chest: Breath sounds normal.  Abdominal: Bowel sounds are normal.  Genitourinary:        Patient has some firm nodular lesions in the distal rectal and perianal area especially involving the posterior wall. Unclear whether these represented  Hemorrhoids polyps or other pathology  Musculoskeletal: Normal range of motion. He exhibits no edema and no tenderness.  Neurological: He is alert and oriented to person, place, and time.  Psychiatric: He has a normal mood and affect. His behavior is normal.          Assessment & Plan:   rectal bleeding perirectal nodular lesions unclear whether these represent hemorrhoids or other pathology. These seem more numerous and firmer than typical hemorrhoidal disease. We'll set up for diagnostic colonoscopy  Coronary disease and dyslipidemia. We'll refill all medications including simvastatin. We'll set up for a complete physical  Hypertension unclear control we'll continue present regimen and recheck in 3 months May need up titration of his medications

## 2010-04-29 NOTE — Patient Instructions (Signed)
GI consultation as scheduled   Annual  exam in 3 months Limit your sodium (Salt) intake    It is important that you exercise regularly, at least 20 minutes 3 to 4 times per week.  If you develop chest pain or shortness of breath seek  medical attention.

## 2010-04-30 ENCOUNTER — Encounter (INDEPENDENT_AMBULATORY_CARE_PROVIDER_SITE_OTHER): Payer: Self-pay | Admitting: *Deleted

## 2010-05-06 NOTE — Letter (Signed)
Summary: New Patient letter  [facility]  [address]   [place], [place] [address]   Phone: [phone]  Fax: [phone]       04/30/2010 MRN: [id number]  [facility] [address] [place], [place]  [address]  [place]  Dear [name],  Welcome to the Gastroenterology Division at [facility].    You are scheduled to see Dr.  Stan Head on Jun 14, 2010 at 10:30am on the 3rd floor at [facility], [address]  We ask that you try to arrive at our office 15 minutes prior to your appointment time to allow for check-in.  We would like you to complete the enclosed self-administered evaluation form prior to your visit and bring it with you on the day of your appointment.  We will review it with you.  Also, please bring a complete list of all your medications or, if you prefer, bring the medication bottles and we will list them.  Please bring your insurance card so that we may make a copy of it.  If your insurance requires a referral to see a specialist, please bring your referral form from your primary care physician.  Co-payments are due at the time of your visit and may be paid by cash, check or credit card.     Your office visit will consist of a consult with your physician (includes a physical exam), any laboratory testing he/she may order, scheduling of any necessary diagnostic testing (e.g. x-ray, ultrasound, CT-scan), and scheduling of a procedure (e.g. Endoscopy, Colonoscopy) if required.  Please allow enough time on your schedule to allow for any/all of these possibilities.    If you cannot keep your appointment, please call [phone] to cancel or reschedule prior to your appointment date.  This allows [place] the opportunity to schedule an appointment for another patient in need of care.  If you do not cancel or reschedule by 5 p.m. the business day prior to your appointment date, you will be charged a $50.00 late cancellation/no-show fee.    Thank you  for choosing [facility] for your medical needs.  We appreciate the opportunity to care for you.  Please visit [place] at our website  to learn more about our practice.                     Sincerely,                                                             The Gastroenterology Division

## 2010-06-09 DIAGNOSIS — Z86010 Personal history of colonic polyps: Secondary | ICD-10-CM

## 2010-06-09 DIAGNOSIS — Z860101 Personal history of adenomatous and serrated colon polyps: Secondary | ICD-10-CM

## 2010-06-09 HISTORY — DX: Personal history of colonic polyps: Z86.010

## 2010-06-09 HISTORY — DX: Personal history of adenomatous and serrated colon polyps: Z86.0101

## 2010-06-14 ENCOUNTER — Encounter: Payer: Self-pay | Admitting: Internal Medicine

## 2010-06-14 ENCOUNTER — Ambulatory Visit (INDEPENDENT_AMBULATORY_CARE_PROVIDER_SITE_OTHER): Payer: BC Managed Care – PPO | Admitting: Internal Medicine

## 2010-06-14 VITALS — BP 138/90 | HR 88 | Ht 70.0 in | Wt 196.0 lb

## 2010-06-14 DIAGNOSIS — Z800 Family history of malignant neoplasm of digestive organs: Secondary | ICD-10-CM

## 2010-06-14 DIAGNOSIS — R198 Other specified symptoms and signs involving the digestive system and abdomen: Secondary | ICD-10-CM

## 2010-06-14 DIAGNOSIS — R1319 Other dysphagia: Secondary | ICD-10-CM

## 2010-06-14 DIAGNOSIS — K6289 Other specified diseases of anus and rectum: Secondary | ICD-10-CM

## 2010-06-14 MED ORDER — PEG-KCL-NACL-NASULF-NA ASC-C 100 G PO SOLR: 1.0000 | Freq: Once | ORAL | Status: AC

## 2010-06-14 MED ORDER — HYDROCODONE-ACETAMINOPHEN 5-500 MG PO TABS: ORAL_TABLET | ORAL | Status: DC

## 2010-06-14 NOTE — Assessment & Plan Note (Signed)
Solid food dysphagia in the setting of what sounds like GERD. Upper GI endoscopy and possible esophageal dilation are indicated. He should ultimately be on a PPI. I will wait for the EGD results prior to represcribing that. Per his request we'll use propofol deep sedation for his procedure as he indicated that he does not want to feel a thing or remember anything.  Risks benefits and indications of the procedure are explained and he understands and agrees to proceed.

## 2010-06-14 NOTE — Progress Notes (Signed)
  Subjective:    Patient ID: [name], male    DOB: July 27, 1955, 55 y.o.   MRN: [id number]  HPI 55 year old white man who is about a week or so a burning rectal pain he describes is fairly deep inside. His buttocks will bother him a little bit as well maybe. He saw his PCP and a rectal exam reveals some hemorrhoids and some nodularity which is felt to be scarring from prior pilonidal cyst repair. There is a persistent pressure and burning pain there. It does not really hurt to defecate. He said some difficulty with bowel movements without inability to produce them in the size may have changed. The previous history indicates rectal bleeding and his PCP visit but he says that never happened. He tried some over-the-counter medications like Preparation H. He had been seen in 2007 recommend that a screening colonoscopy, at that time he was on Plavix. He never followed through with that. He is admittedly not taking his medications as prescribed. He has chronic heartburn problems and intermittent solid food dysphagia at least once a month as well. I had also recommended upper endoscopy in 2007. He also reported rectal bleeding or hematochezia then. His father had colon cancer and died at age 66, exact age of diagnosis not clear. He stopped his proton pump inhibitor because of concerns about osteoporosis and is using cimetidine instead.  Past medical, surgical, social and family history is reviewed and updated in the EMR. Medications and allergies also.  Review of Systems Chronic back pain. He also has a weak urinary stream and nocturia. He is requesting a PSA test. All other systems negative or as per the history of present illness.    Objective:   Physical Exam General: Well-developed, well-nourished and in no acute distress Vitals: Reviewed and listed above Eyes:anicteric. Mouth and posterior pharynx: normal.  Neck: supple w/o thyromegaly or mass.  Lungs: clear. Heart: S1S2, no rubs, murmurs,  gallops. Abdomen: soft, non-tender, no hepatosplenomegaly, hernia, or mass and BS+.  Rectal: some hemorrhoids visible but no other perianal changes. Mild stenosis of anus, nontender. No mass. Brown stool. Prostate normal. Lymphatics: no cervical, [place] or inguinal nodes. Extremities:  no edema Neuro: nonfocal. A&O x 3.  Psych: appropriate mood and  affect.         Assessment & Plan:

## 2010-06-14 NOTE — Assessment & Plan Note (Signed)
Cause of this is not clear to me. Does not sound like a fissure. He couldn't hemorrhoids. There is an differing history about the rectal bleeding between last month and now. Not sure he is a completely reliable historian. A colonoscopy will be performed given the family history of colon cancer the change in bowels and this rectal pain altogether. I have prescribed #20 Vicodin 5 mg strength to be used intermittently for pain do not plan on chronic refills of that.  Risks benefits and indications of colonoscopy explained he understands and agrees to proceed. He is going to have propofol sedation as I explained in the dysphagia assessment and plan, due to his wishes to have the maximum sedation possible and to reduce the risk of an adequate result of moderate sedation. He is a somewhat anxious person so he could be difficult to sedate in the typical fashion starting the propofol will be useful.

## 2010-06-14 NOTE — Assessment & Plan Note (Signed)
He has had some change in bowels with difficulty in defecation. This in the family history of colon cancer indications for colonoscopy as explained in the rectal pain assessment.

## 2010-06-14 NOTE — Patient Instructions (Signed)
Your EGD/Colonoscopy is scheduled on 06/16/2010 at 3pm You MoviPrep will be sent to your pharmacy today   Colonoscopy A colonoscopy is an exam to evaluate your entire colon. In this exam, your colon is cleansed. A long fiberoptic tube is inserted through your rectum and into your colon. The fiberoptic scope (endoscope) is a long bundle of enclosed and very flexible fibers. These fibers transmit light to the area examined and send images from that area to your caregiver. Discomfort is usually minimal. You may be given a drug to help you sleep (sedative) during or prior to the procedure. This exam helps to detect lumps (tumors), polyps, inflammation, and areas of bleeding. Your caregiver may also take a small piece of tissue (biopsy) that will be examined under a microscope. BEFORE THE PROCEDURE  A clear liquid diet may be required for 2 days before the exam.   Liquid injections (enemas) or laxatives may be required.   A large amount of electrolyte solution may be given to you to drink over a short period of time. This solution is used to clean out your colon.   You should be present 1 prior to your procedure or as directed by your caregiver.   Check in at the admissions desk to fill out necessary forms if not preregistered. There will be consent forms to sign prior to the procedure. If accompanied by friends or family, there is a waiting area for them while you are having your procedure.  LET YOUR CAREGIVER KNOW ABOUT:  Allergies to food or medicine.  Medicines taken, including vitamins, herbs, eyedrops, over-the-counter medicines, and creams.   Use of steroids (by mouth or creams).   Previous problems with anesthetics or numbing medicines.   History of bleeding problems or blood clots.  Previous surgery.   Other health problems, including diabetes and kidney problems.   Possibility of pregnancy, if this applies.   AFTER THE PROCEDURE  If you received a sedative and/or pain medicine,  you will need to arrange for someone to drive you home.   Occasionally, there is a little blood passed with the first bowel movement. DO NOT be concerned.  HOME CARE INSTRUCTIONS  It is not unusual to pass moderate amounts of gas and experience mild abdominal cramping following the procedure. This is due to air being used to inflate your colon during the exam. Walking or a warm pack on your belly (abdomen) may help.   You may resume all normal meals and activities after sedatives and medicines have worn off.   Only take over-the-counter or prescription medicines for pain, discomfort, or fever as directed by your caregiver. DO NOT use aspirin or blood thinners if a biopsy was taken. Consult your caregiver for medicine usage if biopsies were taken.  FINDING OUT THE RESULTS OF YOUR TEST Not all test results are available during your visit. If your test results are not back during the visit, make an appointment with your caregiver to find out the results. Do not assume everything is normal if you have not heard from your caregiver or the medical facility. It is important for you to follow up on all of your test results. SEEK IMMEDIATE MEDICAL CARE IF:  You pass large blood clots or fill a toilet with blood following the procedure. This may also occur 10 to 14 days following the procedure. This is more likely if a biopsy was taken.   You develop abdominal pain that keeps getting worse and cannot be relieved with medicine.  Document Released: 01/22/2000 Document Re-Released: 04/20/2009 [facility] Patient Information 2011 [place], [place].

## 2010-06-15 ENCOUNTER — Encounter: Payer: Self-pay | Admitting: Internal Medicine

## 2010-06-16 ENCOUNTER — Ambulatory Visit (AMBULATORY_SURGERY_CENTER): Payer: BC Managed Care – PPO | Admitting: Internal Medicine

## 2010-06-16 ENCOUNTER — Encounter: Payer: Self-pay | Admitting: Internal Medicine

## 2010-06-16 VITALS — BP 108/76 | HR 72 | Temp 98.4°F | Resp 18 | Ht 70.0 in | Wt 196.0 lb

## 2010-06-16 DIAGNOSIS — K449 Diaphragmatic hernia without obstruction or gangrene: Secondary | ICD-10-CM

## 2010-06-16 DIAGNOSIS — K259 Gastric ulcer, unspecified as acute or chronic, without hemorrhage or perforation: Secondary | ICD-10-CM

## 2010-06-16 DIAGNOSIS — K222 Esophageal obstruction: Secondary | ICD-10-CM

## 2010-06-16 DIAGNOSIS — K648 Other hemorrhoids: Secondary | ICD-10-CM

## 2010-06-16 DIAGNOSIS — D131 Benign neoplasm of stomach: Secondary | ICD-10-CM

## 2010-06-16 DIAGNOSIS — K5730 Diverticulosis of large intestine without perforation or abscess without bleeding: Secondary | ICD-10-CM

## 2010-06-16 DIAGNOSIS — K2970 Gastritis, unspecified, without bleeding: Secondary | ICD-10-CM

## 2010-06-16 DIAGNOSIS — R1310 Dysphagia, unspecified: Secondary | ICD-10-CM

## 2010-06-16 DIAGNOSIS — D126 Benign neoplasm of colon, unspecified: Secondary | ICD-10-CM

## 2010-06-16 DIAGNOSIS — R198 Other specified symptoms and signs involving the digestive system and abdomen: Secondary | ICD-10-CM

## 2010-06-16 DIAGNOSIS — K2940 Chronic atrophic gastritis without bleeding: Secondary | ICD-10-CM

## 2010-06-16 HISTORY — PX: COLONOSCOPY W/ POLYPECTOMY: SHX1380

## 2010-06-16 HISTORY — PX: ESOPHAGOGASTRODUODENOSCOPY: SHX1529

## 2010-06-16 MED ORDER — SODIUM CHLORIDE 0.9 % IV SOLN: 500.0000 mL | INTRAVENOUS | Status: DC

## 2010-06-16 MED ORDER — ESOMEPRAZOLE MAGNESIUM 40 MG PO CPDR: 40.0000 mg | DELAYED_RELEASE_CAPSULE | Freq: Every day | ORAL | Status: DC

## 2010-06-16 NOTE — Patient Instructions (Signed)
Stay on a clear liquid diet until 530 PM today then soft foods. Normal consistency foods tomorrow. You had multiple findings - see endoscopy reports for details. Start Nexium for acid reflux, ulcers and gastritis. [facility] No clear cause of your rectal pain was seen. Two small polyps removed, there were hemorrhoids and diverticulosis. Let's see what happens in next few days, and if not better by next week, call us back or we will review with you when pathology results are in (we will call).  Iva Boop, MD, [facility]

## 2010-06-16 NOTE — Progress Notes (Signed)
1 bottle of Nexium samples 40 mg Lot # [id number] exp 10/2012 provided from office. He told me he was using up to 10 aspirin at a time for unclear reasons, in recovery. Advised no NSAID's, ASA.

## 2010-06-17 ENCOUNTER — Telehealth: Payer: Self-pay | Admitting: *Deleted

## 2010-06-17 NOTE — Telephone Encounter (Signed)
Caller id therefore left mess.

## 2010-06-22 NOTE — H&P (Signed)
[name], [name]               ACCOUNT NO.:  [id number]   MEDICAL RECORD NO.:  [id number]          PATIENT TYPE:  INP   LOCATION:  4707                         FACILITY:  [facility]   PHYSICIAN:  Vernice Jefferson, MD          DATE OF BIRTH:  May 21, 1955   DATE OF ADMISSION:  12/21/2006  DATE OF DISCHARGE:                              HISTORY & PHYSICAL   PRIMARY CARE PHYSICIAN:  Pricilla Riffle, MD, [facility]   HISTORY OF PRESENT ILLNESS:  The patient is a 55 year old white male  with history of coronary artery disease.  He is status post PCI with a  Taxus stent to a right coronary artery lesion 2005, who comes in with  complaints of chest pain.  The patient reports this chest pain is  somewhat similar to his prior presentation in 2005.  States his chest  pain is sharp and radiates to his back and later today it was associated  with some diaphoresis.  The patient report the pain woke him from sleep  last night at 2 a.m. and was persistent throughout the day.  He reports  that there was some change in intensity over the course of the evening,  which prompted his visit to the ED.  The patient does report that he has  also had to deal with a recent illness with his wife who has had an  ischemic stroke and points out that this may be related to anxiety  associated with this as well.  Currently the patient is chest pain free  and has no complaints.   PAST MEDICAL HISTORY:  1. Coronary artery disease, as detailed above.  2. Hyperlipidemia.  3. Gastroesophageal reflux disease.   SOCIAL HISTORY:  Nonsmoker, nondrinker, non-IV drug user.  Married with  2 children.   FAMILY HISTORY:  Reviewed and noncontributory to patient's current  medical condition.   MEDICATIONS:  Include aspirin, Plavix, Vytorin, and Nexium.   ALLERGIES:  NO KNOWN DRUG ALLERGIES.   REVIEW OF SYSTEMS:  Negative 11-point review of systems, except for  otherwise dictated in the above HPI.   PHYSICAL EXAMINATION:  VITAL SIGNS:   His blood pressure is 98/64, heart  rate 72, afebrile, saturation 98% on room air.  GENERAL:  Well-developed, well-nourished white male in no acute  distress.  HEENT:  Moist mucous membranes.  Anicteric sclera.  Conjunctiva pallor.  NECK:  Supple.  Full range of motion.  No jugular venous distention.  CARDIOVASCULAR:  Regular rate and rhythm.  No rubs, murmurs, or gallops.  CHEST:  Clear to auscultation bilaterally.  No wheezes, rales, or  rhonchi.  ABDOMEN:  Soft, nontender, nondistended.  Normoactive bowel sounds.  EXTREMITIES:  No peripheral edema.  Pulses 2+ bilaterally.   MEDICAL DECISION MAKING:  EKG personally reviewed by me demonstrates no  acute infiltrates, a normal sinus rhythm, an old inferior infarct, and  repolarization abnormality anteriorly, which is old.   LABORATORY DATA:  Negative biomarkers x1 set, normal creatinine and  hemoglobin within normal limits.   IMPRESSION:  1. Acute coronary syndrome, unstable angina.  2. History  of coronary artery disease.  3. Hyperlipidemia.  4. Gastroesophageal reflux disease.  5. History anxiety disorder.   PLAN:  We will admit the patient to telemetry for Dr. Tenny Craw.  Rule out  for myocardial infarction by serial enzymes.  Given that he has had  negative enzymes with chest pain that has been going on for than 24  hours, we will hold heparin right now as I think it is a low likelihood  that he is having an unstable plaque and we will continue his home  medications with the aspirin, Plavix, and Vytorin.  Given he would  likely go for a noninvasive risk stratification procedure in the  morning, we will hold his beta blocker dosing.  Initially his heart rate  is well-controlled.  His pain is well-controlled.  We will followup labs  in the morning as well as get an a.m. EKG.      Vernice Jefferson, MD  Electronically Signed     JT/MEDQ  D:  12/21/2006  T:  12/21/2006  Job:  [id number]

## 2010-06-22 NOTE — Discharge Summary (Signed)
[name], [name]               ACCOUNT NO.:  [id number]   MEDICAL RECORD NO.:  [id number]          PATIENT TYPE:  INP   LOCATION:  4707                         FACILITY:  [facility]   PHYSICIAN:  Pricilla Riffle, MD, FACCDATE OF BIRTH:  January 08, 1956   DATE OF ADMISSION:  12/21/2006  DATE OF DISCHARGE:  12/21/2006                               DISCHARGE SUMMARY   PRIMARY CARDIOLOGIST:  Dr. Dietrich Pates.   PRIMARY CARE PHYSICIAN:  Dr. Amador Cunas.   DISCHARGE DIAGNOSIS:  Chest pain.   SECONDARY DIAGNOSES:  1. Coronary artery disease status post PCI and stenting of a chronic      total occlusion in the right coronary artery with Taxus drug-      eluting stent 10/2003.  2. Hyperlipidemia.  3. Gastroesophageal reflux disease.  4. Borderline hypertension.   ALLERGIES:  NO KNOWN DRUG ALLERGIES.   PROCEDURES:  None.   HISTORY OF PRESENT ILLNESS:  This is a 55 year old married Caucasian  male with prior history of CAD status post acute coronary syndrome with  PCI and stenting of a chronic total occlusion of the right coronary  artery with Taxus drug-eluting stent in 10/2003.  He was in his usual  state of health until approximately 2:30 a.m. on the morning of  12/21/2006 when he began to experience chest discomfort with radiation  to his back.  Discomfort persisted throughout the day and worsened in  intensity with some mild diaphoresis.  He subsequently presented to the  [facility] Emergency Department where his ECG showed old inferior  infarct with otherwise no acute changes.  His point of care markers were  negative as was his D-dimer.  He was admitted for further evaluation and  rule out.   HOSPITAL COURSE:  The patient subsequently ruled out for MI with  resolution of chest pain.  He will be discharged home today with plans  for outpatient Myoview next week.   DISCHARGE LABS:  Hemoglobin 13.1, hematocrit 39.1, WBC 6.0, platelets  265.  Sodium 140, potassium 4.1, chloride 110, CO2  28, BUN 14,  creatinine 1.18, glucose 116.  Total bilirubin 0.6, alkaline phosphatase  47, AST 16, ALT 24, total protein 6.2, albumin 3.7.  Calcium 9.1,  magnesium 2.1, amylase 61.  CK 102, MB of 0.1, troponin I 0.01.   DISPOSITION:  Patient is being discharged home today in good condition.   FOLLOW-UP APPOINTMENTS:  He is scheduled for a follow-up exercise  Myoview on 12/27/2006 at 8:45 a.m.  He will follow up with Dr. Tenny Craw  01/05/2007 at 4:00 p.m.   DISCHARGE MEDICATIONS:  1. Aspirin 81 mg daily.  2. Plavix 75 mg daily.  3. Vytorin 10/40 mg nightly.  4. Nexium 40 mg daily.  5. Xanax as previously prescribed.  6. Nitroglycerin 0.4 mg sublingual p.r.n. chest pain.   OUTSTANDING LABS AND STUDIES:  None.   DURATION OF DISCHARGE ENCOUNTER:  45 minutes including physician time.      Nicolasa Ducking, ANP      Pricilla Riffle, MD, [facility]  Electronically Signed    CB/MEDQ  D:  12/21/2006  T:  12/22/2006  Job:  [id number]

## 2010-06-22 NOTE — Assessment & Plan Note (Signed)
[facility]                            CARDIOLOGY OFFICE NOTE   DAXTEN, KOVALENKO                      MRN:          [id number]  DATE:01/05/2007                            DOB:          1955/09/04    Mr. [name] is a 55 year old gentleman who I followed in cardiology  clinic.  He has a history of coronary artery disease.  He was last seen  in clinic back in August of 2007.   He has a drug-eluting stent to the RCA (subtotally occluded RCA).  Post  discharge complicated by a DVT, again back in September of 2005.   The patient was admitted to [facility] actually earlier this month on  the 13th, ruled out for MI.  He had a episode of chest pain at 2:30 in  the morning radiating to his back, persisted all day and worsened,  presented to the [facility] emergency room.  Again ruled out.  Set up for an  outpatient evaluation.   The patient re-presented to the emergency room on the 14th and again  with an episode of pain that began in the morning, resolved within 30  minutes.   Note, the patient had been under increased stress with his wife recently  being hospitalized around the same time for a stroke.   Since seeing the patient he has done okay, and since the ER visit he has  done okay.  He has been active.  He is actually getting Christmas  decorations down from the attic, denies chest pressure, shortness of  breath with this.   On the 19th of this month, he underwent Myoview testing.  He walked for  11 minutes, stopped because of fatigue, exercising for a total of 13.7  mets.  There is evidence of an inferior infarct with trivial peri-  infarct ischemia.  LVF was 61%.  Note this was unchanged from previous  study in 2007.   CURRENT MEDICATIONS:  1. Plavix 75  2. Aciphex 20  3. Aspirin 81  4. Vytorin 10/40   PHYSICAL EXAMINATION:  On exam, the patient currently is in no distress.  Blood pressure 131/87, pulse is 75, weight 196.  LUNGS:   Clear.  NECK:  JVP is normal.  No bruits.  CARDIAC:  Regular rate and rhythm, S1, S2, no S3, no murmurs.  ABDOMEN:  Benign.  EXTREMITIES:  No edema.   IMPRESSION:  1. CAD as noted above by catheterization in 2005.  Question of how      this region, may have collateral filling at present some.  Still he      is asymptomatic.  I would not change his therapy.  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  Would      continue medical therapy.  He is now over 3 years out from the      stenting.  I think it is okay to back down to just aspirin.  2. Dyslipidemia.  We will check fasting lipid panel and AST at his  convenience.  Otherwise I will set followup for Spring, sooner if      problems develop.     Pricilla Riffle, MD, [facility]  Electronically Signed    PVR/MedQ  DD: 01/05/2007  DT: 01/05/2007  Job #: [id number]   cc:   Gordy Savers, MD

## 2010-06-24 NOTE — Progress Notes (Signed)
Quick Note:  [facility]: small tubular adenoma only on colonoscopy so colonoscopy recall 7 years place recall - NO LETTER - no EGD recall  Office - call patient and let him know all biopsies benign - stomach ulcers from aspirin - stop that and other NSAIDS and stay on Nexium  Colon polyps benign - repeat colonoscopy in 7 years  REV me in 6-8 weeks ______

## 2010-06-25 ENCOUNTER — Encounter: Payer: Self-pay | Admitting: *Deleted

## 2010-06-25 ENCOUNTER — Telehealth: Payer: Self-pay

## 2010-06-25 NOTE — H&P (Signed)
Francis, Ibarra               ACCOUNT NO.:  [id number]   MEDICAL RECORD NO.:  [id number]          PATIENT TYPE:  INP   LOCATION:  1830                         FACILITY:  [facility]   PHYSICIAN:  Darden Palmer., M.D.DATE OF BIRTH:  [birth year]   DATE OF ADMISSION:  10/26/2003  DATE OF DISCHARGE:                                HISTORY & PHYSICAL   REASON FOR ADMISSION:  Chest pain.   HISTORY:  This 55 year old male is admitted to the hospital for treatment of  chest pain following stent placement last week.  The patient has a previous  history of chest discomfort that started on August 27 with anterior  substernal chest pressure, seen in the emergency room, and sent home.  He  was seen later for Cardiolite scan done by Dr. Mayford Knife on October 16, 2003,  showing an inferior defect with a small area of reversibility.  LV function  was normal.  On the day of admission, he had sharp pain between his shoulder  blades with some anterior chest pain and saw his primary care physician.  He  was noted to have inferior Q waves and sent to the hospital.  His pain had  resolved by the time that he arrived.   He was taken to the emergency room and saw Dr. 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  He had stent placement by Dr. Amil Amen with a 3.0 x 32 mm Taxus  drug-eluting stent with a good result.  His enzymes following this were  negative.  He had some slight chest discomfort following the procedure and  was transferred to telemetry.  He had some slightly low blood pressure and  as thought to have had inferolateral hypokinesis on ventriculogram.  He was  begun on Zocor and initially ACE inhibitors but this was discontinued  because of hypotension.   On the night prior to discharge, he had an  episode of chest discomfort that  resolved and was somewhat atypical.  He was discharged on September 16,  which was two days ago.  The day of discharge, he had recurrence of chest  discomfort which was right sided and more achy and called Dr. Norris Cross  office and was advised to take ibuprofen.  He had recurrence of pain  Saturday and then today had the onset of right-sided chest discomfort and  took 2 whiffs of a nitroglycerin spray.  He became somewhat sweaty and weak  following that and felt that he might pass out.  His neighbors, who are  nurses, were called.  They came and saw him, and then EMS was called, and he  was brought here.  Initial enzymes were negative, and EKG was unremarkable.  He was quite anxious.  I should note that prior to having the episode of  pain this afternoon, he had gone to visit his mother-in-law, and evidently  there has been significant situational stress associated with  this, and his  wife became upset which upset the patient somewhat also.   PAST MEDICAL HISTORY:  Remarkable for some depression and anxiety for which  he has been receiving some counseling.  There is a history of mild  hyperlipidemia and also reflux.   PAST SURGICAL HISTORY:  He has had back surgery previously for repair of a  pilonidal cyst.   ALLERGIES:  PENICILLIN.   CURRENT MEDICATIONS:  1.  Nexium daily.  2.  Aspirin 81 mg daily.  3.  Plavix 75 mg daily.  4.  Zocor 20 mg daily.   FAMILY HISTORY:  Father died of colon cancer.  Mother has dementia.  One  brother and sister alive and well.   SOCIAL HISTORY:  He is married and has two children.  He does not smoke,  drinks an occasional alcohol.  Works in [employer], [employer] .   REVIEW OF SYSTEMS:  Significant situational stress and anxiety.  He has  slept poorly since going home and in the hospital.  He does have a history  of reflux esophagitis.  The chest pain that he had is definitely different   from the previous two episodes.  Other than noted above, the remainder the  Review of Systems is unremarkable.   PHYSICAL EXAMINATION:  GENERAL:  He is an anxious male with currently no  acute distress.  VITAL SIGNS:  Blood pressure 110/70, pulse currently 80 and regular.  SKIN:  Warm and dry.  ENT:  EOMI.  PERRLA.  Conjunctivae and sclerae clear.  Pharynx negative.  NECK:  Supple without masses.  No JVD, thyromegaly, or bruits.  LUNGS:  Clear to auscultation and percussion.  CARDIAC:  Normal S1 and S2, no S3.  ABDOMEN:  Soft, nontender.  Large ecchymoses present in the previous  catheterization site.  EXTREMITIES:  Femoral distal pulses 2.  No edema noted.   LABORATORY AND X-RAY DATA:  A 12-lead EKG was nonacute and shows previous  inferior T wave changes and Q waves.   Initial cardiac enzymes were negative.   IMPRESSION:  1.  Right-sided chest discomfort and left-sided chest pain which is atypical      for myocardial ischemia but with previous stenting of the right coronary      artery for subtotal occlusion several days ago.  2.  Coronary artery disease with:      1.  Subtotal occlusion of right coronary artery recently stented.      2.  Residual disease involving the septal perforator, minimal disease in          the other vessels.  3.  Anxiety, situational stress.  4.  Gastroesophageal reflux disease.  5.  Syncope without loss of consciousness today which likely was due to      nitroglycerin.   RECOMMENDATIONS:  1.  The patient will be admitted to a telemetry bed for observation status.  2.  Continue Plavix, aspirin, nitroglycerin, heparin.  3.  Further workup per Dr. Armanda Magic.       WST/MEDQ  D:  10/26/2003  T:  10/26/2003  Job:  [id number]   cc:   Armanda Magic, M.D.  [address], [address]  [place], [place] [address]  Fax: [phone]   Marcene Duos, M.D.  [phone]  [place]  [place] [address]  Fax: [phone]

## 2010-06-25 NOTE — Telephone Encounter (Signed)
Patient advised or results.  He verbalized understanding to avoid NSAIDS and to stay on Nexium.  Follow up visit scheduled for 08/25/10 8:30

## 2010-06-25 NOTE — Telephone Encounter (Signed)
Created in error

## 2010-06-25 NOTE — Telephone Encounter (Signed)
Message copied by Darcey Nora on Fri Jun 25, 2010 10:46 AM ------      Message from: Stan Head      Created: Thu Jun 24, 2010  9:52 PM       LEC: small tubular adenoma only on colonoscopy so colonoscopy recall 7 years place recall - NO LETTER - no EGD recall            Office - call patient and let him know all biopsies benign - stomach ulcers from aspirin - stop that and other NSAIDS and stay on Nexium                   Colon polyps benign - repeat colonoscopy in 7 years                 REV me in 6-8 weeks

## 2010-06-25 NOTE — Discharge Summary (Signed)
[name], [name]               ACCOUNT NO.:  [id number]   MEDICAL RECORD NO.:  [id number]          PATIENT TYPE:  INP   LOCATION:  3729                         FACILITY:  [facility]   PHYSICIAN:  Armanda Magic, M.D.     DATE OF BIRTH:  1955-07-17   DATE OF ADMISSION:  11/05/2003  DATE OF DISCHARGE:  11/08/2003                                 DISCHARGE SUMMARY   ADMISSION DIAGNOSES:  1.  Nonocclusive deep venous thrombosis right lower extremity.  2.  Coronary artery disease, stable.  3.  Hyperlipidemia, continue therapy.   DISCHARGE DIAGNOSES:  1.  Right lower extremity deep venous thrombosis, stable.  2.  Coronary artery disease, stable without angina.  3.  Hyperlipidemia on statin therapy.   PROCEDURE:  None.   DISCHARGE STATUS:  Stable.  Improved.   ADMISSION HISTORY:  Please see completed H&P for details, but in short, this  is a 55 year old male with known coronary artery disease who underwent Taxus  stent placement to an occluded RCA on October 21, 2003.  He had been  trying to walk on a daily basis post discharge.  However, began complaining  of swelling and pain to his right thigh.  Venous Doppler showed nonocclusive  DVT in the distal femoral vein.  He was admitted for anticoagulation.   PHYSICAL EXAM ON ADMISSION:  Please see complete H&P for details.  In short,  vital signs were stable.  He was afebrile.  Physical exam essentially  unremarkable with the exception of some ecchymosis noted to the right knee.  Distal pulses +2 bilaterally.   Admission labs showed mild anemia with hemoglobin 12.8.  INR 1.0.  BMET  normal.   HOSPITAL COURSE:  He was admitted for initiation of Coumadin therapy with  Lovenox bridging.  All of his outpatient medications will be continued per  his usual period.  His Plavix will also need to be continued.   Essentially, the rest of his hospitalization was uneventful.  Coumadin  loading continued with Lovenox.  By November 07, 2003, his  INR was 1.7, and  by November 08, 2003 his INR was therapeutic at 3.6.  Lovenox was  discontinued.  He was up and ambulating without difficulties.  He was  discharged home.   DISCHARGE MEDICATIONS:  1.  Plavix 75 mg daily.  2.  Aspirin 81 mg daily.  3.  Coumadin 5 mg daily with the exception of the day of discharge November 08, 2003 he is not to take any.  He is instructed to take it at supper      time.  4.  Nexium 40 mg daily.  5.  Zocor 20 mg daily.  6.  Lexapro 10 mg daily.  7.  Ativan 0.5 mg at h.s. p.r.n.   He was instructed essentially to maintain bed rest and no activity for the  next two days.  Then, he will be on very restricted limited activities after  that until followup with Dr. Mayford Knife.   He is to maintain a low fat, low cholesterol diet.   He will have lab  work for his Coumadin at Dr. Norris Cross office on Monday,  November 10, 2003.  He will see Dr. Mayford Knife for followup Friday, November 21, 2003 at 1 p.m.      HB/MEDQ  D:  02/26/2004  T:  02/26/2004  Job:  [id number]

## 2010-06-25 NOTE — Discharge Summary (Signed)
NAME:  [name], [name]                         ACCOUNT NO.:  [id number]   MEDICAL RECORD NO.:  [id number]                   PATIENT TYPE:  INP   LOCATION:  2027                                 FACILITY:  [facility]   PHYSICIAN:  Armanda Magic, M.D.                  DATE OF BIRTH:  Jun 24, 1955   DATE OF ADMISSION:  10/21/2003  DATE OF DISCHARGE:  10/24/2003                                 DISCHARGE SUMMARY   ADDENDUM TO DISCHARGE SUMMARY:  After further review of the chart, beta-  blocker had been held secondary to continued mild hypotension and mild  bradycardia.  Therefore he will not go home on Toprol XL.  These aspects  will be reevaluated upon his followup with Dr. Mayford Knife and their restart at  her discretion.       HB/MEDQ  D:  10/24/2003  T:  10/26/2003  Job:  [id number]

## 2010-06-25 NOTE — Assessment & Plan Note (Signed)
[facility]                              CARDIOLOGY OFFICE NOTE   YEIDEN, FRENKEL                      MRN:          [id number]  DATE:10/07/2005                            DOB:          March 05, 1955    IDENTIFICATION:  Mr. [name] is a 56 year old gentleman with a history of CAD.  I last saw him back in June.  Refer to this for full details.  He is status  post acute coronary syndrome back in September, 2005.  Stent at that time  was a drug-eluting stent to the right coronary artery to a subtotally  occluded right coronary artery.  Post discharge complicated by DVT.   When I saw him last, he was complaining of some chest pain which appeared  atypical, some back pain.  He also said he felt a little more anemic.   I reviewed his cath films with [name].  His RCA was felt to be a  significant vessel with some distal disease.  May represent progression in  this area, but the plan was to optimize medical therapy if he was still  having symptoms of repeat left heart catheterization.   SCANS:  The patient's Myoview scan back in May:  He walked in 10 minutes  with a possibility of mild ischemia.  Since then, the patient has actually  done much better.  He has not had anymore chest pain or back pain.  He  thinks it was more muscular.  He said he is getting his energy back again.   He was seen in [name] office recently.  Blood pressure, lower  number was up a little bit.  Toprol was prescribed, but he said when he took  the prescription home, he realized he had been on Toprol in the hospital and  may have had an allergic reaction to it.   CURRENT MEDICATIONS:  1. Plavix 75 daily.  2. AcipHex 20 daily.  3. Aspirin 81 mg daily.  4. Vytorin 10/40 daily.  5. One-a-Day multivitamin.   PHYSICAL EXAMINATION:  VITAL SIGNS:  Blood pressure on arrival 132/94.  On  my check, 110/78.  Pulse is 62.  Weight 201.  GENERAL:  Patient is in no  distress.  NECK:  No bruits.  LUNGS:  Clear.  CARDIAC:  Regular rate and rhythm.  S1 and S2.  No S3.  No murmurs.  ABDOMEN:  Benign.  EXTREMITIES:  Good distal pulses.  No edema.   IMPRESSION:  1. Coronary artery disease:  Patient with some atypical symptoms of chest      tightness.  I was actually more concerned with his anemic feeling      that he may have been giving out as an anginal equivalent.  He says,      though, this has gotten better.  He really says he has been active and      has not had a problem.  I have reviewed his films.  Again, the Myoview      scan, the fact that he walked 10 minutes, again puts him in a category  of medical therapy equivalent to intervention.  Again on review of the      records, he did have a difficult time after the last intervention.  I      would recommend continuing for now on the current regimen.  I think      Plavix should probably be continued indefinitely.  He can come off      colonoscopy and go back on it.  Continue on aspirin.  I would not add      any other agents for now with his blood pressure as I check it.  2. Hypertension:  Borderline.  I think it has gone up and down but will      need to follow again.  He is concerned about taking Toprol, given a      past experience.  I think an ACE inhibitor would be good for him.  3. Dyslipidemia:  We will need to follow up on fasting lipids.   Otherwise, all set to see him back in the winter, February, sooner if  problems develop.   A 12-lead EKG, normal sinus rhythm, 62 beats per minute.                                Pricilla Riffle, MD, [facility]    PVR/MedQ  DD:  10/07/2005  DT:  10/07/2005  Job #:  [id number]   cc:   Gordy Savers, MD  Iva Boop, MD, [facility]

## 2010-06-25 NOTE — Cardiovascular Report (Signed)
NAME:  BRODEY, BONN                         ACCOUNT NO.:  [id number]   MEDICAL RECORD NO.:  [id number]                   PATIENT TYPE:  INP   LOCATION:  2920                                 FACILITY:  [facility]   PHYSICIAN:  Armanda Magic, M.D.                  DATE OF BIRTH:  09/06/1955   DATE OF PROCEDURE:  10/21/2003  DATE OF DISCHARGE:                              CARDIAC CATHETERIZATION   REFERRING PHYSICIAN:  Marcene Duos, M.D.   PROCEDURE:  Left heart catheterization, coronary angiography, left  ventriculography.   OPERATOR:  Armanda Magic, M.D.   INDICATIONS FOR PROCEDURE:  Myocardial infarction.   COMPLICATIONS:  None.   IV ACCESS:  Via right femoral artery 6 French sheath.   This is a very pleasant 55 year old white male with no previous cardiac  history, just a history of anxiety, depression, and reflux, who presented to  the emergency room  with chest and back pain which started last night around  3 a.m. in the morning.  He woke up with sharp severe chest pain in his back  between his shoulder blades with chest pressure, no nausea and vomiting or  diaphoresis.  He presented to [facility] office today and was noted to  have new T wave inversions in the inferior leads and now presents to the  emergency room still having pain.  He now presents to the for cardiac  catheterization laboratory.   The patient presented to the cardiac catheterization laboratory and informed  consent was obtained.  The patient was connected to continuous heart rate  and pulse oximetry monitoring and intermittent blood pressure monitoring.  The right groin was prepped and draped in a sterile fashion.  1% Xylocaine  was used for local anesthesia.  Using a modified Seldinger technique, a 6  French sheath was placed in the right femoral artery.  Under fluoroscopic  guidance, a 6 [place] JL4 catheter was placed in the left coronary artery.  Multiple cine films were taken in the 30  degree RAO and 40 degree LAO views.  This catheter was then exchanged out over a guide-wire for 6 [place] JR4  catheter which was placed under fluoroscopic guidance in the right coronary  artery.  Multiple cine films were taken in 30 degree RAO and 40 degree LAO  views.  This catheter was then exchanged out over a guide-wire for 6 French  angled pigtail catheter which was placed under fluoroscopic guidance in the  left ventricular cavity.  Left ventriculography was performed in a 30 degree  RAO views using a total of 30 mL of contrast at 15 mL per second.  The  catheter was then pulled back across the aortic valve with no significant  gradient.  At the end of the procedure, the catheter was removed over a  guide-wire, the patient went on to PCI of the RCA.   RESULTS:  Left main coronary artery  is widely patent and bifurcates into the  left anterior descending  and left circumflex artery.  The left anterior  descending is widely patent throughout its course to the apex and gives rise  to a large septal perforator which has a 90% proximal stenosis.  It also  gives rise to a diagonal branch which is widely patent.  The left circumflex  is widely patent throughout its course in the mid and proximal portion  giving rise to two obtuse marginal branches, both of which have 20-30%  narrowing in the proximal portion.  The distal circumflex ends in a third  obtuse marginal branch which has a 30% narrowing.  There is evidence of left  to right collaterals filling the distal RCA.   The right coronary artery is diffusely diseased proximally and subtotal to  the mid portion with TIMI I flow to the distal portion of the RCA.   Left ventriculography shows low normal LV systolic function, EF 50-55% with  inferolateral akinesis, aortic pressure 107/77 mmHg, left ventricular  pressure 122/12 mmHg, at pull back, aortic pressure 113/75 mmHg, LV pressure  113/13 mmHg, LVEDP 25 mmHg, aortic valve gradient at  pull back was none.   ASSESSMENT:  1.  Two vessel obstructive coronary artery disease, subtotal right coronary      artery, 90% septal perforator.  2.  Low normal left ventricular function with ejection fraction 50% with      anterolateral akinesis.   PLAN:  PCI per Dr. Amil Amen, aspirin, Plavix, Lopressor 12.5 mg b.i.d.                                               Armanda Magic, M.D.    TT/MEDQ  D:  10/21/2003  T:  10/21/2003  Job:  [id number]   cc:   Marcene Duos, M.D.  [address]  [place]  [place] [address]  Fax: [phone]

## 2010-06-25 NOTE — Discharge Summary (Signed)
NAME:  [name], [name]                         ACCOUNT NO.:  [id number]   MEDICAL RECORD NO.:  [id number]                   PATIENT TYPE:  INP   LOCATION:  2027                                 FACILITY:  [facility]   PHYSICIAN:  Armanda Magic, M.D.                  DATE OF BIRTH:  19-Jul-1955   DATE OF ADMISSION:  10/21/2003  DATE OF DISCHARGE:  10/24/2003                                 DISCHARGE SUMMARY   ADMISSION DIAGNOSES:  1.  Acute coronary syndrome.  2.  Questionable inferior myocardial infarction.  3.  Gastroesophageal reflux disease.   DISCHARGE DIAGNOSES:  1.  Unstable angina with negative enzymes.  2.  Coronary artery disease status post PCI and Taxus stent placement to the      right coronary artery October 21, 2003.  3.  Hypotension, resolved.  4.  Hyperlipidemia.  5.  Gastroesophageal reflux disease.   PROCEDURE:  Left heart catheterization October 21, 2003.   COMPLICATIONS:  None.   DISCHARGE STATUS:  Stable.  Improved.   HISTORY OF PRESENT ILLNESS:  Please see complete H&P for details, but in  short this is a 55 year old male with only a history of GERD, went to his  PCP with complaint of intermittent episodes of chest discomfort.  He  apparently went to the Emergency Room at [facility] on October 04, 2003 with the same complaint.  EKG, cardiac enzymes, and labs were done, all  with no acute findings.  He was discharged home.  He was seen at [facility] for a stress Cardiolite that was read by Dr. Mayford Knife on October 16, 2003.  Results showed an inferior defect with a small area of  reversibility and questionable peri-infarction ischemia.  LV function was  normal.  Apparently on the day of this admission he had a significant  episode of generalized anterior chest pain that awakened him from sleep.  He  also had pain between his shoulder blades.  He went to his PCP.  Q waves  were noted in his inferior leads and he was sent to the emergency room  for  further treatment and evaluation.   Physical exam on admission -- Please see complete H&P for details, but in  short his vital signs were stable, he was afebrile.  EKG showed a normal  sinus rhythm with a ventricular rate of 68.  Q waves were noted in his  inferior leads that were present on his stress Cardiolite, however he now  had new T wave inversion in lead III.  A chest x-ray was normal.  Admission  labs including CBC, CM PPT/PTT were all normal.  Initial cardiac enzymes  were normal with a total CK of 59, 1.0 MB and a troponin of 0.03.  He was  prepped for the cardiac cath lab and taken there for further evaluation.   Results of the cardiac cath by Dr.  Turner showed a two-vessel obstructive  CAD with a subtotaled RCA and a 90% septal perforator.  LV function was low  normal with an EF calculated at 50% and there were some inferolateral  akinesis noted.  At this point Dr. Amil Amen was consulted and PCI was planned  for the RCA.  There was no amenable PCI to the septal perforator.  Dr.  Amil Amen placed a Taxus stent to the RCI with good results.  The patient  tolerated the procedure well.  Perclose was performed.  There were no  further complications.   Repeat cardiac enzymes were all normal.  For the rest of his hospital  admission he essentially remained stable without complaints.  Vital signs  were normal.  He remained afebrile.  No further EKG changes.  He was up and  ambulating the day post cath without problems.  No further complaints of  chest discomfort.  He was noted to be mildly hypotensive after low-dose beta-  blocker and ACE inhibitor were added.  Blood pressure mildly hypotensive in  the 110's.  His Lisinopril was placed on hold.  He was continued on very low-  dose beta-blocker.  Results of lipid profile showed a total of 222,  triglycerides 174, HDL low at 38, LDL high at 149.  He was started on Zocor.   By October 24, 2003 he had no problems.  Blood pressure  still somewhat  low.  100/68.  Heart rate mildly bradycardic at 59.  The rest of his vital  signs were unremarkable.  His physical exam was normal.  He was ambulating  without any complaints of dizziness or lightheadedness, excessive fatigue or  shortness of breath.  He was discharged home without further incident.   DISCHARGE MEDICATIONS:  1.  Nexium 40 mg daily.  2.  Aspirin 81 mg daily.  3.  Plavix 75 mg daily.  4.  Toprol XL 25 mg 1/2 tablet daily.   He was instructed not to engage in any excessively strenuous physical  activity for the next 1-2 weeks.  Then  activity as tolerated.  He is to  maintain a low-fat/low-cholesterol diet.   He has an appointment to see Dr. Mayford Knife for followup on Friday November 14, 2003 at 12:45 p.m.       HB/MEDQ  D:  10/24/2003  T:  10/25/2003  Job:  [id number]   cc:   Marcene Duos, M.D.  [address]  [place]  [place] [address]  Fax: [phone]

## 2010-07-23 ENCOUNTER — Other Ambulatory Visit: Payer: BC Managed Care – PPO

## 2010-07-26 ENCOUNTER — Other Ambulatory Visit: Payer: BC Managed Care – PPO

## 2010-07-30 ENCOUNTER — Encounter: Payer: Self-pay | Admitting: Internal Medicine

## 2010-07-30 ENCOUNTER — Ambulatory Visit (INDEPENDENT_AMBULATORY_CARE_PROVIDER_SITE_OTHER): Payer: BC Managed Care – PPO | Admitting: Internal Medicine

## 2010-07-30 VITALS — BP 130/96 | HR 64 | Temp 98.7°F | Ht 70.0 in | Wt 200.0 lb

## 2010-07-30 DIAGNOSIS — Z23 Encounter for immunization: Secondary | ICD-10-CM

## 2010-07-30 DIAGNOSIS — I10 Essential (primary) hypertension: Secondary | ICD-10-CM

## 2010-07-30 DIAGNOSIS — Z0000 Encounter for general adult medical examination without abnormal findings: Secondary | ICD-10-CM

## 2010-07-30 DIAGNOSIS — I2510 Atherosclerotic heart disease of native coronary artery without angina pectoris: Secondary | ICD-10-CM

## 2010-07-30 DIAGNOSIS — E785 Hyperlipidemia, unspecified: Secondary | ICD-10-CM

## 2010-07-30 LAB — BASIC METABOLIC PANEL
BUN: 24 mg/dL — ABNORMAL HIGH (ref 6–23)
Calcium: 9.8 mg/dL (ref 8.4–10.5)
Creatinine, Ser: 1.1 mg/dL (ref 0.4–1.5)
GFR: 74.22 mL/min (ref >60.00)
Glucose, Bld: 87 mg/dL (ref 70–99)

## 2010-07-30 LAB — CBC WITH DIFFERENTIAL/PLATELET
Basophils Absolute: 0 10*3/uL (ref 0.0–0.1)
HCT: 43.8 % (ref 39.0–52.0)
Lymphs Abs: 1.5 10*3/uL (ref 0.7–4.0)
Monocytes Relative: 6.6 % (ref 3.0–12.0)
Platelets: 268 10*3/uL (ref 150.0–400.0)
RDW: 14.3 % (ref 11.5–14.6)

## 2010-07-30 LAB — HEPATIC FUNCTION PANEL
ALT: 18 U/L (ref 0–53)
AST: 17 U/L (ref 0–37)
Bilirubin, Direct: 0.1 mg/dL (ref 0.0–0.3)
Total Bilirubin: 0.3 mg/dL (ref 0.3–1.2)

## 2010-07-30 LAB — LDL CHOLESTEROL, DIRECT: Direct LDL: 190.6 mg/dL

## 2010-07-30 LAB — TSH: TSH: 1.64 u[IU]/mL (ref 0.35–5.50)

## 2010-07-30 LAB — LIPID PANEL: Triglycerides: 83 mg/dL (ref 0.0–149.0)

## 2010-07-30 MED ORDER — ESOMEPRAZOLE MAGNESIUM 40 MG PO CPDR: 40.0000 mg | DELAYED_RELEASE_CAPSULE | Freq: Every day | ORAL | Status: DC

## 2010-07-30 MED ORDER — LISINOPRIL 10 MG PO TABS: 10.0000 mg | ORAL_TABLET | Freq: Every day | ORAL | Status: DC

## 2010-07-30 MED ORDER — SIMVASTATIN 40 MG PO TABS: 40.0000 mg | ORAL_TABLET | Freq: Every day | ORAL | Status: DC

## 2010-07-30 NOTE — Patient Instructions (Signed)
It is important that you exercise regularly, at least 20 minutes 3 to 4 times per week.  If you develop chest pain or shortness of breath seek  medical attention.  Return in 6 months for follow-up  

## 2010-07-30 NOTE — Progress Notes (Signed)
Quick Note:  Spoke with pt - informed of lab results and instructed to take cholesterol med as instructed by dr. Amador Cunas at appt. KIK ______

## 2010-07-30 NOTE — Progress Notes (Signed)
Subjective:    Patient ID: [name], male    DOB: [birth year], 55 y.o.   MRN: [id number]  HPI  55 year old patient who is seen today for an annual exam. He has a history of coronary artery disease and is seeing quite infrequently. He has a history of mild hypertension and dyslipidemia. Unfortunately he is taking no medications on a regular basis. Compliance issues addressed. 6 weeks ago he underwent upper and lower endoscopy and apparently had a small ulcer related to excessive salicylate use. Presently he is on Nexium and aspirin therapy has been held. He has no cardiopulmonary or GI complaints.  Past Medical History  Diagnosis Date  . ANXIETY STATE NEC 11/01/2006  . ANXIETY 12/25/2007  . CORONARY ARTERY DISEASE 08/02/2006    off Plavix x 3 years  . DIVERTICULITIS OF COLON 01/06/2010  . DVT, HX OF 08/02/2006  . GERD 08/02/2006  . HYPERLIPIDEMIA 08/02/2006  . HYPERTENSION 08/05/2008  . Memory loss 07/21/2009  . Pilonidal cyst     low back pain   Past Surgical History  Procedure Date  . Coronary angioplasty with stent placement   . Pilonidal cyst excision     teenager    reports that he has never smoked. He has never used smokeless tobacco. He reports that he drinks alcohol. He reports that he uses illicit drugs. family history includes Colon cancer in his father and Dementia in his mother. Allergies  Allergen Reactions  . Penicillins     REACTION: unspecified       Review of Systems  Constitutional: Negative for fever, chills, activity change, appetite change and fatigue.  HENT: Negative for hearing loss, ear pain, congestion, rhinorrhea, sneezing, mouth sores, trouble swallowing, neck pain, neck stiffness, dental problem, voice change, sinus pressure and tinnitus.   Eyes: Negative for photophobia, pain, redness and visual disturbance.  Respiratory: Negative for apnea, cough, choking, chest tightness, shortness of breath and wheezing.   Cardiovascular: Negative for chest  pain, palpitations and leg swelling.  Gastrointestinal: Negative for nausea, vomiting, abdominal pain, diarrhea, constipation, blood in stool, abdominal distention, anal bleeding and rectal pain.  Genitourinary: Negative for dysuria, urgency, frequency, hematuria, flank pain, decreased urine volume, discharge, penile swelling, scrotal swelling, difficulty urinating, genital sores and testicular pain.  Musculoskeletal: Negative for myalgias, back pain, joint swelling, arthralgias and gait problem.  Skin: Negative for color change, rash and wound.  Neurological: Negative for dizziness, tremors, seizures, syncope, facial asymmetry, speech difficulty, weakness, light-headedness, numbness and headaches.  Hematological: Negative for adenopathy. Does not bruise/bleed easily.  Psychiatric/Behavioral: Negative for suicidal ideas, hallucinations, behavioral problems, confusion, sleep disturbance, self-injury, dysphoric mood, decreased concentration and agitation. The patient is not nervous/anxious.        Objective:   Physical Exam  Constitutional: He appears well-developed and well-nourished.  HENT:  Head: Normocephalic and atraumatic.  Right Ear: External ear normal.  Left Ear: External ear normal.  Nose: Nose normal.  Mouth/Throat: Oropharynx is clear and moist.  Eyes: Conjunctivae and EOM are normal. Pupils are equal, round, and reactive to light. No scleral icterus.  Neck: Normal range of motion. Neck supple. No JVD present. No thyromegaly present.  Cardiovascular: Regular rhythm and normal heart sounds.  Exam reveals no gallop and no friction rub.   No murmur heard.      Pedal pulses intact except for an absent left dorsalis pedis pulse  Pulmonary/Chest: Effort normal and breath sounds normal. He exhibits no tenderness.  Abdominal: Soft. Bowel sounds are normal. He exhibits  no distension and no mass. There is no tenderness.  Genitourinary:       Retrogram performed 6 weeks ago    Musculoskeletal: Normal range of motion. He exhibits no edema and no tenderness.  Lymphadenopathy:    He has no cervical adenopathy.  Neurological: He is alert. He has normal reflexes. No cranial nerve deficit. Coordination normal.  Skin: Skin is warm and dry. No rash noted.  Psychiatric: He has a normal mood and affect. His behavior is normal.          Assessment & Plan:   Annual clinical exam Dyslipidemia. We'll resume simvastatin and check a lipid profile Hypertension. We'll resume lisinopril. Compliance issues addressed  Followup cardiology Recheck here 6 months  Exercise regimen encouraged

## 2010-08-25 ENCOUNTER — Encounter: Payer: Self-pay | Admitting: Internal Medicine

## 2010-08-25 ENCOUNTER — Ambulatory Visit: Payer: BC Managed Care – PPO | Admitting: Internal Medicine

## 2010-10-07 ENCOUNTER — Telehealth: Payer: Self-pay | Admitting: Internal Medicine

## 2010-10-07 MED ORDER — LORAZEPAM 0.5 MG PO TABS: 0.5000 mg | ORAL_TABLET | Freq: Four times a day (QID) | ORAL | Status: DC | PRN

## 2010-10-07 NOTE — Telephone Encounter (Signed)
Pt Called yesterday and left a voicemail message about getting a prescription of LORazepam (ATIVAN) 0.5 MG tablet. He is not currently on it but would like to get back on it. Please contact pt.  [facility]

## 2010-10-07 NOTE — Telephone Encounter (Signed)
Was on lorazepam 0.5mg  q6h prn anxiety in the past . Last seen 07/30/2010 , this was not on med list at that time. Please advise

## 2010-10-07 NOTE — Telephone Encounter (Signed)
#  60 RF 2

## 2010-10-07 NOTE — Telephone Encounter (Signed)
Pt aware.

## 2010-10-07 NOTE — Telephone Encounter (Signed)
Addended by: Duard Brady I on: 10/07/2010 05:30 PM   Modules accepted: Orders

## 2010-10-07 NOTE — Telephone Encounter (Signed)
New order place and called to pharmacy

## 2010-11-16 LAB — I-STAT 8, (EC8 V) (CONVERTED LAB)
Acid-base deficit: 1
Chloride: 108
HCT: 41
Operator id: 161631
Potassium: 3.7
pCO2, Ven: 40.3 — ABNORMAL LOW

## 2010-11-16 LAB — POCT CARDIAC MARKERS
CKMB, poc: <1 — ABNORMAL LOW
Myoglobin, poc: 52
Myoglobin, poc: 60.6
Operator id: 192351
Troponin i, poc: <0.05

## 2010-11-16 LAB — CBC
HCT: 37.6 — ABNORMAL LOW
HCT: 39.1
Hemoglobin: 12.7 — ABNORMAL LOW
Hemoglobin: 13.1
MCHC: 33.7
MCV: 86.2
Platelets: 264
RBC: 4.36
RDW: 16.5 — ABNORMAL HIGH
WBC: 6
WBC: 7.2

## 2010-11-16 LAB — AMYLASE: Amylase: 61

## 2010-11-16 LAB — COMPREHENSIVE METABOLIC PANEL
Alkaline Phosphatase: 47
BUN: 14
Chloride: 110
GFR calc non Af Amer: >60
Glucose, Bld: 116 — ABNORMAL HIGH
Potassium: 4.1
Total Bilirubin: 0.6

## 2010-11-16 LAB — BASIC METABOLIC PANEL WITH GFR
Calcium: 9
GFR calc Af Amer: >60
GFR calc non Af Amer: 58 — ABNORMAL LOW
Potassium: 4
Sodium: 138

## 2010-11-16 LAB — BASIC METABOLIC PANEL
BUN: 16
CO2: 27
Chloride: 103
Creatinine, Ser: 1.29
Glucose, Bld: 135 — ABNORMAL HIGH

## 2010-11-16 LAB — CARDIAC PANEL(CRET KIN+CKTOT+MB+TROPI)
Relative Index: 1.1
Troponin I: 0.01

## 2010-11-16 LAB — DIFFERENTIAL
Basophils Absolute: 0
Basophils Relative: 1
Eosinophils Absolute: 0.2
Eosinophils Relative: 3
Lymphocytes Relative: 25
Lymphs Abs: 1.8
Monocytes Absolute: 0.6
Monocytes Relative: 8
Neutro Abs: 4.6
Neutrophils Relative %: 63

## 2010-11-16 LAB — POCT I-STAT CREATININE: Operator id: 161631

## 2010-11-16 LAB — MAGNESIUM: Magnesium: 2.1

## 2010-11-16 LAB — D-DIMER, QUANTITATIVE: D-Dimer, Quant: <0.22

## 2011-03-14 ENCOUNTER — Other Ambulatory Visit: Payer: Self-pay | Admitting: Internal Medicine

## 2011-03-14 NOTE — Telephone Encounter (Signed)
Pt son was dx with lymphoma dad is requesting something stronger than lorazepam. [facility]

## 2011-03-14 NOTE — Telephone Encounter (Signed)
Suggest the patient double his current dose of lorazepam. Okay to call in a prescription if needed for the 1 mg dose.

## 2011-03-14 NOTE — Telephone Encounter (Signed)
Suggest to the patient double current lorazepam dose to take a total of 1 mg every 6 hours as needed. Okay to call in a prescription if needed

## 2011-03-15 MED ORDER — LORAZEPAM 0.5 MG PO TABS: 1.0000 mg | ORAL_TABLET | Freq: Four times a day (QID) | ORAL | Status: DC | PRN

## 2011-03-15 NOTE — Telephone Encounter (Signed)
Left a message for pt to return call 

## 2011-03-15 NOTE — Telephone Encounter (Signed)
Pt returned call. Pls call back asap.    °

## 2011-03-15 NOTE — Telephone Encounter (Signed)
Pt aware and rx called into pharmacy. 

## 2011-08-05 ENCOUNTER — Other Ambulatory Visit: Payer: Self-pay | Admitting: Internal Medicine

## 2011-09-10 ENCOUNTER — Other Ambulatory Visit: Payer: Self-pay | Admitting: Internal Medicine

## 2011-09-12 ENCOUNTER — Telehealth: Payer: Self-pay | Admitting: Internal Medicine

## 2011-09-12 MED ORDER — ESOMEPRAZOLE MAGNESIUM 40 MG PO CPDR: 40.0000 mg | DELAYED_RELEASE_CAPSULE | Freq: Every day | ORAL | Status: DC

## 2011-09-12 NOTE — Telephone Encounter (Signed)
I will do a 30day - but he needs to be seen - last seen 07/2010 - please make rov med follow up

## 2011-09-12 NOTE — Telephone Encounter (Signed)
Patient called stating that he is out of refills of his Nexium and his pharmacy, [facility] told him to call and request it. Please assist.

## 2011-09-26 ENCOUNTER — Ambulatory Visit (INDEPENDENT_AMBULATORY_CARE_PROVIDER_SITE_OTHER): Payer: BC Managed Care – PPO | Admitting: Internal Medicine

## 2011-09-26 ENCOUNTER — Encounter: Payer: Self-pay | Admitting: Internal Medicine

## 2011-09-26 VITALS — BP 118/70 | Temp 98.3°F | Wt 200.0 lb

## 2011-09-26 DIAGNOSIS — E785 Hyperlipidemia, unspecified: Secondary | ICD-10-CM

## 2011-09-26 DIAGNOSIS — I10 Essential (primary) hypertension: Secondary | ICD-10-CM

## 2011-09-26 DIAGNOSIS — I2510 Atherosclerotic heart disease of native coronary artery without angina pectoris: Secondary | ICD-10-CM

## 2011-09-26 MED ORDER — ESOMEPRAZOLE MAGNESIUM 40 MG PO CPDR: 40.0000 mg | DELAYED_RELEASE_CAPSULE | Freq: Every day | ORAL | Status: DC

## 2011-09-26 MED ORDER — LORAZEPAM 0.5 MG PO TABS: 1.0000 mg | ORAL_TABLET | Freq: Four times a day (QID) | ORAL | Status: DC | PRN

## 2011-09-26 MED ORDER — HYOSCYAMINE SULFATE 0.125 MG SL SUBL: 0.1250 mg | SUBLINGUAL_TABLET | SUBLINGUAL | Status: DC | PRN

## 2011-09-26 MED ORDER — LISINOPRIL 10 MG PO TABS: 10.0000 mg | ORAL_TABLET | Freq: Every day | ORAL | Status: DC

## 2011-09-26 MED ORDER — SIMVASTATIN 40 MG PO TABS: 40.0000 mg | ORAL_TABLET | Freq: Every day | ORAL | Status: DC

## 2011-09-26 NOTE — Progress Notes (Signed)
  Subjective:    Patient ID: [name], male    DOB: 1955-10-01, 56 y.o.   MRN: [id number]  HPI  56 year old patient who is seen today for followup. He has a history of hypertension and dyslipidemia. Has not been seen here in over one year. Been quite well. Only complaint is decreased auditory acuity from the right ear    Review of Systems  Constitutional: Negative for fever, chills, appetite change and fatigue.  HENT: Positive for hearing loss. Negative for ear pain, congestion, sore throat, trouble swallowing, neck stiffness, dental problem, voice change and tinnitus.   Eyes: Negative for pain, discharge and visual disturbance.  Respiratory: Negative for cough, chest tightness, wheezing and stridor.   Cardiovascular: Negative for chest pain, palpitations and leg swelling.  Gastrointestinal: Negative for nausea, vomiting, abdominal pain, diarrhea, constipation, blood in stool and abdominal distention.  Genitourinary: Negative for urgency, hematuria, flank pain, discharge, difficulty urinating and genital sores.  Musculoskeletal: Negative for myalgias, back pain, joint swelling, arthralgias and gait problem.  Skin: Negative for rash.  Neurological: Negative for dizziness, syncope, speech difficulty, weakness, numbness and headaches.  Hematological: Negative for adenopathy. Does not bruise/bleed easily.  Psychiatric/Behavioral: Negative for behavioral problems and dysphoric mood. The patient is not nervous/anxious.        Objective:   Physical Exam  Constitutional: He is oriented to person, place, and time. He appears well-developed.  HENT:  Head: Normocephalic.  Right Ear: External ear normal.  Left Ear: External ear normal.  Mouth/Throat: Oropharynx is clear and moist.       Bilateral cerumen impactions  Eyes: Conjunctivae and EOM are normal.  Neck: Normal range of motion.  Cardiovascular: Normal rate and normal heart sounds.   Pulmonary/Chest: Breath sounds normal.    Abdominal: Bowel sounds are normal.  Musculoskeletal: Normal range of motion. He exhibits no edema and no tenderness.  Neurological: He is alert and oriented to person, place, and time.  Psychiatric: He has a normal mood and affect. His behavior is normal.          Assessment & Plan:   Hypertension controlled Dyslipidemia Bilateral cerumen impactions. Both canals irrigated until clear  Schedule CPX

## 2011-09-26 NOTE — Patient Instructions (Addendum)
Limit your sodium (Salt) intake    It is important that you exercise regularly, at least 20 minutes 3 to 4 times per week.  If you develop chest pain or shortness of breath seek  medical attention. 

## 2011-09-28 ENCOUNTER — Telehealth: Payer: Self-pay | Admitting: Internal Medicine

## 2011-09-28 NOTE — Telephone Encounter (Signed)
Pt need refill on lorazepam [facility]. [facility] received other rxs except this one

## 2011-09-29 NOTE — Telephone Encounter (Signed)
Called pharmacy and spoke with pharmicist.  Pt's medication was not called in on 09/26/2011,  Rx called in today.

## 2011-10-03 ENCOUNTER — Other Ambulatory Visit: Payer: Self-pay | Admitting: Internal Medicine

## 2011-11-15 ENCOUNTER — Telehealth: Payer: Self-pay | Admitting: Internal Medicine

## 2011-11-15 MED ORDER — ESOMEPRAZOLE MAGNESIUM 40 MG PO CPDR: 40.0000 mg | DELAYED_RELEASE_CAPSULE | Freq: Every day | ORAL | Status: DC

## 2011-11-15 NOTE — Telephone Encounter (Signed)
done

## 2011-11-15 NOTE — Telephone Encounter (Signed)
Patient's spouse called stating that he need a refill of his nexium sent to [facility]. Please assist.

## 2011-12-20 ENCOUNTER — Other Ambulatory Visit (INDEPENDENT_AMBULATORY_CARE_PROVIDER_SITE_OTHER): Payer: BC Managed Care – PPO

## 2011-12-20 DIAGNOSIS — Z0000 Encounter for general adult medical examination without abnormal findings: Secondary | ICD-10-CM

## 2011-12-20 LAB — CBC WITH DIFFERENTIAL/PLATELET
Basophils Relative: 0.7 % (ref 0.0–3.0)
Eosinophils Relative: 4.5 % (ref 0.0–5.0)
HCT: 42.3 % (ref 39.0–52.0)
Hemoglobin: 13.9 g/dL (ref 13.0–17.0)
Lymphs Abs: 1.3 10*3/uL (ref 0.7–4.0)
Monocytes Relative: 8 % (ref 3.0–12.0)
Neutro Abs: 3.8 10*3/uL (ref 1.4–7.7)
RBC: 4.53 Mil/uL (ref 4.22–5.81)
RDW: 14 % (ref 11.5–14.6)

## 2011-12-20 LAB — HEPATIC FUNCTION PANEL
ALT: 16 U/L (ref 0–53)
AST: 15 U/L (ref 0–37)
Albumin: 3.9 g/dL (ref 3.5–5.2)
Total Protein: 6.5 g/dL (ref 6.0–8.3)

## 2011-12-20 LAB — POCT URINALYSIS DIPSTICK
Blood, UA: NEGATIVE
Protein, UA: NEGATIVE
Spec Grav, UA: 1.025
Urobilinogen, UA: 0.2

## 2011-12-20 LAB — TSH: TSH: 2.2 u[IU]/mL (ref 0.35–5.50)

## 2011-12-20 LAB — BASIC METABOLIC PANEL
GFR: 72.32 mL/min (ref >60.00)
Glucose, Bld: 92 mg/dL (ref 70–99)
Potassium: 4.8 mEq/L (ref 3.5–5.1)
Sodium: 139 mEq/L (ref 135–145)

## 2011-12-20 LAB — LIPID PANEL
LDL Cholesterol: 129 mg/dL — ABNORMAL HIGH (ref 0–99)
Total CHOL/HDL Ratio: 5
VLDL: 18.4 mg/dL (ref 0.0–40.0)

## 2011-12-27 ENCOUNTER — Ambulatory Visit (INDEPENDENT_AMBULATORY_CARE_PROVIDER_SITE_OTHER): Payer: BC Managed Care – PPO | Admitting: Internal Medicine

## 2011-12-27 ENCOUNTER — Encounter: Payer: Self-pay | Admitting: Internal Medicine

## 2011-12-27 VITALS — BP 112/74 | HR 76 | Temp 98.2°F | Resp 18 | Ht 70.0 in | Wt 204.0 lb

## 2011-12-27 DIAGNOSIS — I10 Essential (primary) hypertension: Secondary | ICD-10-CM

## 2011-12-27 DIAGNOSIS — I2510 Atherosclerotic heart disease of native coronary artery without angina pectoris: Secondary | ICD-10-CM

## 2011-12-27 DIAGNOSIS — Z0000 Encounter for general adult medical examination without abnormal findings: Secondary | ICD-10-CM

## 2011-12-27 DIAGNOSIS — E785 Hyperlipidemia, unspecified: Secondary | ICD-10-CM

## 2011-12-27 DIAGNOSIS — K219 Gastro-esophageal reflux disease without esophagitis: Secondary | ICD-10-CM

## 2011-12-27 MED ORDER — ASPIRIN 81 MG PO TBEC: 81.0000 mg | DELAYED_RELEASE_TABLET | Freq: Every day | ORAL | Status: DC

## 2011-12-27 NOTE — Progress Notes (Signed)
Subjective:    Patient ID: [name], male    DOB: 12-Oct-1955, 56 y.o.   MRN: [id number]  HPI   56 year-old patient who is seen today for an annual exam. He has a history of coronary artery disease. He has quite well. And 2012 he underwent upper and lower endoscopy. Earlier this year he was resumed on statin therapy which he continues to tolerate. He is now on chronic PPI therapy due 2 esophageal stricture. His CAD has been stable. He has treated hypertension and dyslipidemia. He no longer is taking baby aspirin. This will be resumed   Past Medical History  Diagnosis Date  . ANXIETY STATE NEC 11/01/2006  . ANXIETY 12/25/2007  . CORONARY ARTERY DISEASE 08/02/2006    off Plavix x 3 years  . DIVERTICULITIS OF COLON 01/06/2010  . DVT, HX OF 08/02/2006  . 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 HYPERLIPIDEMIA 08/02/2006  . HYPERTENSION 08/05/2008  . Memory loss 07/21/2009  . Pilonidal cyst     low back pain   Past Surgical History  Procedure Date  . Coronary angioplasty with stent placement   . Pilonidal cyst excision     teenager  . Esophagogastroduodenoscopy 06/16/2010    antral ulcers, esophageal stricture - dilated, duodenitis, hiatal hernia  . Colonoscopy w/ polypectomy 06/16/2010    diminutive adenoma, diverticulosis, hemorrhoids    reports that he has never smoked. He has never used smokeless tobacco. He reports that he drinks alcohol. He reports that he uses illicit drugs. family history includes Colon cancer in his father and Dementia in his mother. Allergies  Allergen Reactions  . Penicillins     REACTION: unspecified       Review of Systems  Constitutional: Negative for fever, chills, activity change, appetite change and fatigue.  HENT: Negative for hearing loss, ear pain, congestion, rhinorrhea, sneezing, mouth sores, trouble swallowing, neck pain, neck stiffness, dental problem, voice change, sinus pressure and tinnitus.   Eyes: Negative for photophobia, pain, redness and  visual disturbance.  Respiratory: Negative for apnea, cough, choking, chest tightness, shortness of breath and wheezing.   Cardiovascular: Negative for chest pain, palpitations and leg swelling.  Gastrointestinal: Negative for nausea, vomiting, abdominal pain, diarrhea, constipation, blood in stool, abdominal distention, anal bleeding and rectal pain.  Genitourinary: Negative for dysuria, urgency, frequency, hematuria, flank pain, decreased urine volume, discharge, penile swelling, scrotal swelling, difficulty urinating, genital sores and testicular pain.  Musculoskeletal: Negative for myalgias, back pain, joint swelling, arthralgias and gait problem.  Skin: Negative for color change, rash and wound.  Neurological: Negative for dizziness, tremors, seizures, syncope, facial asymmetry, speech difficulty, weakness, light-headedness, numbness and headaches.  Hematological: Negative for adenopathy. Does not bruise/bleed easily.  Psychiatric/Behavioral: Negative for suicidal ideas, hallucinations, behavioral problems, confusion, sleep disturbance, self-injury, dysphoric mood, decreased concentration and agitation. The patient is not nervous/anxious.        Objective:   Physical Exam  Constitutional: He appears well-developed and well-nourished.       Blood pressure 110/72  HENT:  Head: Normocephalic and atraumatic.  Right Ear: External ear normal.  Left Ear: External ear normal.  Nose: Nose normal.  Mouth/Throat: Oropharynx is clear and moist.  Eyes: Conjunctivae normal and EOM are normal. Pupils are equal, round, and reactive to light. No scleral icterus.  Neck: Normal range of motion. Neck supple. No JVD present. No thyromegaly present.  Cardiovascular: Regular rhythm and normal heart sounds.  Exam reveals no gallop and no friction rub.   No murmur  heard. Pulmonary/Chest: Effort normal and breath sounds normal. He exhibits no tenderness.  Abdominal: Soft. Bowel sounds are normal. He exhibits  no distension and no mass. There is no tenderness.  Genitourinary: Prostate normal and penis normal. Guaiac negative stool.       Retrogram performed 6 weeks ago  Musculoskeletal: Normal range of motion. He exhibits no edema and no tenderness.  Lymphadenopathy:    He has no cervical adenopathy.  Neurological: He is alert. He has normal reflexes. No cranial nerve deficit. Coordination normal.  Skin: Skin is warm and dry. No rash noted.  Psychiatric: He has a normal mood and affect. His behavior is normal.          Assessment & Plan:   Annual clinical exam Dyslipidemia. We'll   Continue simvastatin Hypertension. We'll resume lisinopril. Compliance issues addressed Coronary artery disease. Will resume aspirin 81 mg daily  Followup cardiology Recheck here 12  months  Exercise regimen encouraged

## 2011-12-27 NOTE — Patient Instructions (Signed)
Limit your sodium (Salt) intake    It is important that you exercise regularly, at least 20 minutes 3 to 4 times per week.  If you develop chest pain or shortness of breath seek  medical attention.  Please check your blood pressure on a regular basis.  If it is consistently greater than 150/90, please make an office appointment.  Return in one year for follow-up  

## 2012-01-31 ENCOUNTER — Telehealth: Payer: Self-pay | Admitting: Internal Medicine

## 2012-01-31 MED ORDER — LORAZEPAM 0.5 MG PO TABS: 1.0000 mg | ORAL_TABLET | Freq: Four times a day (QID) | ORAL | Status: DC | PRN

## 2012-01-31 NOTE — Telephone Encounter (Signed)
Pt notified Rx refill for Lorazepam was called into pharmacy.

## 2012-01-31 NOTE — Telephone Encounter (Signed)
Patient came in stating that he has no more refills left of his lorazepam 0.5mg  take 2 tabs po q6hrs prn for anxiety sent to [facility] pharmacy on [address]. Please assist and inform patient when done.

## 2012-02-03 ENCOUNTER — Emergency Department (HOSPITAL_COMMUNITY)
Admission: EM | Admit: 2012-02-03 | Discharge: 2012-02-03 | Discharge status: Against Medical Advice | Payer: BC Managed Care – PPO | Attending: Emergency Medicine | Admitting: Emergency Medicine

## 2012-02-03 ENCOUNTER — Encounter (HOSPITAL_COMMUNITY): Payer: Self-pay | Admitting: Nurse Practitioner

## 2012-02-03 DIAGNOSIS — F919 Conduct disorder, unspecified: Secondary | ICD-10-CM | POA: Insufficient documentation

## 2012-02-03 DIAGNOSIS — F489 Nonpsychotic mental disorder, unspecified: Secondary | ICD-10-CM | POA: Insufficient documentation

## 2012-02-03 NOTE — ED Notes (Addendum)
Pt was asked to change into paper scrubs, security at bedside to wand. Pt states "this is ridiculous I dont need to change to see a doctor." states he wants to leave. Dr Ranae Palms made aware of situation and states there is no reason to hold pt at this time. Pt was allowed to leave. Charge RN made aware of situation

## 2012-02-03 NOTE — ED Notes (Signed)
Pt reports he is "mad at the world" since he lost his job. States he is mad at the country and mad at the [employer]. Wife states pt has been taking his ativan too much, he states "that's why they gave it to me." Denies HI. STates he thought of hurting himself but "im too chicken to do it." wife states he gets violent at times and she is afraid. Pt is alert, aggressive in triage.

## 2012-02-06 ENCOUNTER — Ambulatory Visit (INDEPENDENT_AMBULATORY_CARE_PROVIDER_SITE_OTHER): Payer: BC Managed Care – PPO | Admitting: Internal Medicine

## 2012-02-06 ENCOUNTER — Encounter: Payer: Self-pay | Admitting: Internal Medicine

## 2012-02-06 VITALS — BP 140/80 | HR 80 | Temp 98.3°F | Resp 18 | Wt 202.0 lb

## 2012-02-06 DIAGNOSIS — F411 Generalized anxiety disorder: Secondary | ICD-10-CM

## 2012-02-06 DIAGNOSIS — I10 Essential (primary) hypertension: Secondary | ICD-10-CM

## 2012-02-06 DIAGNOSIS — F329 Major depressive disorder, single episode, unspecified: Secondary | ICD-10-CM

## 2012-02-06 NOTE — Patient Instructions (Signed)
Psychiatric evaluation as discussed  Please call there is any worsening depression

## 2012-02-06 NOTE — Progress Notes (Signed)
Subjective:    Patient ID: [name], male    DOB: [birth year], 56 y.o.   MRN: [id number]  HPI  56 year old patient who has a long history of anxiety disorder. 3 days ago he was seen at the ED with suicidal ideation but left before evaluation. He is quite anxious and depressed but states he is not really suicidal because of family ramifications.  He states that he had serious side effects with Wellbutrin as well as SSRIs (Paxil) in the past. He is sleeping poorly. Stressors include the poor health of his wife who is disabled and a son who also has serious health issues. He recently has been notified of impending job loss.  Past Medical History  Diagnosis Date  . ANXIETY STATE NEC 11/01/2006  . ANXIETY 12/25/2007  . CORONARY ARTERY DISEASE 08/02/2006    off Plavix x 3 years  . DIVERTICULITIS OF COLON 01/06/2010  . DVT, HX OF 08/02/2006  . 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 HYPERLIPIDEMIA 08/02/2006  . HYPERTENSION 08/05/2008  . Memory loss 07/21/2009  . Pilonidal cyst     low back pain    History   Social History  . Marital Status: Married    Spouse Name: N/A    Number of Children: 2  . Years of Education: N/A   Occupational History  . Investment banker, corporate    Social History Main Topics  . Smoking status: Never Smoker   . Smokeless tobacco: Never Used  . Alcohol Use: Yes     Comment: rarely  . Drug Use: Yes     Comment: marjuana as per 2011 Echart H&P denies any at this time (06/2010  . Sexually Active: Not on file   Other Topics Concern  . Not on file   Social History Narrative   Daily caffeine    Past Surgical History  Procedure Date  . Coronary angioplasty with stent placement   . Pilonidal cyst excision     teenager  . Esophagogastroduodenoscopy 06/16/2010    antral ulcers, esophageal stricture - dilated, duodenitis, hiatal hernia  . Colonoscopy w/ polypectomy 06/16/2010    diminutive adenoma, diverticulosis, hemorrhoids    Family History  Problem Relation Age of  Onset  . Colon cancer Father     deceased age 61  . Dementia Mother     Allergies  Allergen Reactions  . Penicillins     REACTION: unspecified    Current Outpatient Prescriptions on File Prior to Visit  Medication Sig Dispense Refill  . esomeprazole (NEXIUM) 40 MG capsule Take 1 capsule (40 mg total) by mouth daily before breakfast.  90 capsule  3  . lisinopril (PRINIVIL,ZESTRIL) 10 MG tablet Take 1 tablet (10 mg total) by mouth daily. Patient only taking when BP is high  90 tablet  6  . LORazepam (ATIVAN) 0.5 MG tablet Take 2 tablets (1 mg total) by mouth every 6 (six) hours as needed for anxiety.  120 tablet  3  . simvastatin (ZOCOR) 40 MG tablet Take 1 tablet (40 mg total) by mouth daily.  90 tablet  6  . aspirin 81 MG EC tablet Take 1 tablet (81 mg total) by mouth daily. Swallow whole.  30 tablet  12  . hyoscyamine (LEVSIN/SL) 0.125 MG SL tablet Place 1 tablet (0.125 mg total) under the tongue every 4 (four) hours as needed for cramping.  30 tablet  0    BP 140/80  Pulse 80  Temp 98.3 F (36.8 C) (Oral)  Resp 18  Wt 202 lb (91.627 kg)       Review of Systems  Constitutional: Negative for fever, chills, appetite change and fatigue.  HENT: Negative for hearing loss, ear pain, congestion, sore throat, trouble swallowing, neck stiffness, dental problem, voice change and tinnitus.   Eyes: Negative for pain, discharge and visual disturbance.  Respiratory: Negative for cough, chest tightness, wheezing and stridor.   Cardiovascular: Negative for chest pain, palpitations and leg swelling.  Gastrointestinal: Negative for nausea, vomiting, abdominal pain, diarrhea, constipation, blood in stool and abdominal distention.  Genitourinary: Negative for urgency, hematuria, flank pain, discharge, difficulty urinating and genital sores.  Musculoskeletal: Negative for myalgias, back pain, joint swelling, arthralgias and gait problem.  Skin: Negative for rash.  Neurological: Negative for  dizziness, syncope, speech difficulty, weakness, numbness and headaches.  Hematological: Negative for adenopathy. Does not bruise/bleed easily.  Psychiatric/Behavioral: Positive for suicidal ideas, behavioral problems, sleep disturbance and dysphoric mood. The patient is nervous/anxious.        Objective:   Physical Exam  Constitutional: He appears well-developed and well-nourished. No distress.  Psychiatric:       Depressed affect          Assessment & Plan:   Depression Anxiety disorder with considerable situational stressors  We'll set up a prompt psychiatric evaluation. Continue when necessary lorazepam

## 2012-02-13 ENCOUNTER — Other Ambulatory Visit: Payer: Self-pay | Admitting: Internal Medicine

## 2012-02-13 NOTE — Telephone Encounter (Signed)
Pt states he is out of meds and needs new script for LORazepam (ATIVAN) 0.5 MG tablet.  Pt states he told MD when he was in that he was taking way more than he was supposed to. Pt states MD offered to write him another script. Pt script filled 12/24 (120 Tablets )Pt states he cannot wait until 03/02/12 to get refill. Pharm: [facility]

## 2012-02-13 NOTE — Telephone Encounter (Signed)
Ok #60; Has psychiatry appt been made??

## 2012-02-13 NOTE — Telephone Encounter (Signed)
Called pharmacy no need for new order pt has refills available. Told pharmacist okay.

## 2012-02-13 NOTE — Telephone Encounter (Signed)
Spoke to pt told him Rx is ready at pharmacy for [facility] and asked him if Psychiatry appt was made. Pt stated no someone called him on Friday and they are suppose to call back. Told pt okay if do not hear from anyone by Wed to call me back. Pt verbalized understanding.

## 2012-02-22 ENCOUNTER — Ambulatory Visit (INDEPENDENT_AMBULATORY_CARE_PROVIDER_SITE_OTHER): Payer: BC Managed Care – PPO | Admitting: Licensed Clinical Social Worker

## 2012-02-22 DIAGNOSIS — F411 Generalized anxiety disorder: Secondary | ICD-10-CM

## 2012-03-07 ENCOUNTER — Ambulatory Visit: Payer: BC Managed Care – PPO | Admitting: Licensed Clinical Social Worker

## 2012-03-24 ENCOUNTER — Other Ambulatory Visit: Payer: Self-pay

## 2012-04-06 ENCOUNTER — Telehealth: Payer: Self-pay | Admitting: Internal Medicine

## 2012-04-06 NOTE — Telephone Encounter (Signed)
Called pharmacy spoke to pharmacist pt has refills left. Tried to contact pt number not working.

## 2012-04-06 NOTE — Telephone Encounter (Signed)
Pt needs refill of: LORazepam (ATIVAN) 0.5 MG tablet Pharm: [facility] [address]

## 2012-04-08 ENCOUNTER — Encounter: Payer: Self-pay | Admitting: Internal Medicine

## 2012-04-09 NOTE — Telephone Encounter (Signed)
Pharm told pt it was too soon to refill. Pt states the quantity is wrong on the bottle.  Pharm told him it was refilled on 03/28/12. (12 days ago)  Pls advise. New number put in.

## 2012-04-10 NOTE — Telephone Encounter (Signed)
Spoke to pt told him can not refill Lorazepam and the most Dr. Amador Cunas wrote for was 120. Told pt he is taking too much and needs to see someone psychiatrist. Pt stated he saw Judithe Modest but she charges too much. Told pt needs to make an appt with Dr. Amador Cunas to discuss. Pt verbalized understanding and will call back to schedule appt.

## 2012-05-22 ENCOUNTER — Ambulatory Visit (INDEPENDENT_AMBULATORY_CARE_PROVIDER_SITE_OTHER): Payer: BC Managed Care – PPO | Admitting: Family Medicine

## 2012-05-22 VITALS — BP 120/78 | HR 88 | Temp 98.5°F | Wt 208.0 lb

## 2012-05-22 DIAGNOSIS — K529 Noninfective gastroenteritis and colitis, unspecified: Secondary | ICD-10-CM

## 2012-05-22 DIAGNOSIS — K5289 Other specified noninfective gastroenteritis and colitis: Secondary | ICD-10-CM

## 2012-05-22 MED ORDER — HYOSCYAMINE SULFATE 0.125 MG SL SUBL: 0.1250 mg | SUBLINGUAL_TABLET | SUBLINGUAL | Status: AC | PRN

## 2012-05-22 MED ORDER — ONDANSETRON HCL 4 MG PO TABS: 4.0000 mg | ORAL_TABLET | Freq: Three times a day (TID) | ORAL | Status: DC | PRN

## 2012-05-22 MED ORDER — HYOSCYAMINE SULFATE 0.125 MG SL SUBL: 0.1250 mg | SUBLINGUAL_TABLET | SUBLINGUAL | Status: DC | PRN

## 2012-05-22 NOTE — Patient Instructions (Addendum)
-  plenty of fluids  -zofran and levsin prn for cramping and nausea  -loperamide (imodium) for diarrhea  -no dairy  -follow up in 2 days  -if worsening, severe symptoms, can not tolerate fluids go to emergency room

## 2012-05-22 NOTE — Progress Notes (Addendum)
No chief complaint on file.   HPI:  Diarrhea, abd cramping, nausea: -difuse intermittent waves of cramps, feels like low grade temp, few episodes of watery diarrhea -no known sick contacts, no recent travel or abx -has some ? IBS and use hycosamine for this and wonders if could have some of this -denies: blood or mucus in stools, inability to tolerate fluids -hx diverticulitis? On chart, but reports this doesn't feel lie that  ROS: See pertinent positives and negatives per HPI.  Past Medical History  Diagnosis Date  . ANXIETY STATE NEC 11/01/2006  . ANXIETY 12/25/2007  . CORONARY ARTERY DISEASE 08/02/2006    off Plavix x 3 years  . DIVERTICULITIS OF COLON 01/06/2010  . DVT, HX OF 08/02/2006  . 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 HYPERLIPIDEMIA 08/02/2006  . HYPERTENSION 08/05/2008  . Memory loss 07/21/2009  . Pilonidal cyst     low back pain    Family History  Problem Relation Age of Onset  . Colon cancer Father     deceased age 49  . Dementia Mother     History   Social History  . Marital Status: Married    Spouse Name: N/A    Number of Children: 2  . Years of Education: N/A   Occupational History  . Investment banker, corporate    Social History Main Topics  . Smoking status: Never Smoker   . Smokeless tobacco: Never Used  . Alcohol Use: Yes     Comment: rarely  . Drug Use: Yes     Comment: marjuana as per 2011 Echart H&P denies any at this time (06/2010  . Sexually Active: Not on file   Other Topics Concern  . Not on file   Social History Narrative   Daily caffeine    Current outpatient prescriptions:aspirin 81 MG EC tablet, Take 1 tablet (81 mg total) by mouth daily. Swallow whole., Disp: 30 tablet, Rfl: 12;  esomeprazole (NEXIUM) 40 MG capsule, Take 1 capsule (40 mg total) by mouth daily before breakfast., Disp: 90 capsule, Rfl: 3;  hyoscyamine (LEVSIN/SL) 0.125 MG SL tablet, Place 1 tablet (0.125 mg total) under the tongue every 4 (four) hours as needed for cramping.,  Disp: 15 tablet, Rfl: 0 lisinopril (PRINIVIL,ZESTRIL) 10 MG tablet, Take 1 tablet (10 mg total) by mouth daily. Patient only taking when BP is high, Disp: 90 tablet, Rfl: 6;  LORazepam (ATIVAN) 0.5 MG tablet, Take 2 tablets (1 mg total) by mouth every 6 (six) hours as needed for anxiety., Disp: 120 tablet, Rfl: 3;  ondansetron (ZOFRAN) 4 MG tablet, Take 1 tablet (4 mg total) by mouth every 8 (eight) hours as needed for nausea., Disp: 15 tablet, Rfl: 0 simvastatin (ZOCOR) 40 MG tablet, Take 1 tablet (40 mg total) by mouth daily., Disp: 90 tablet, Rfl: 6  EXAM:  Filed Vitals:   05/22/12 1645  BP: 120/78  Pulse: 88  Temp: 98.5 F (36.9 C)    Body mass index is 29.84 kg/(m^2).  GENERAL: vitals reviewed and listed above, alert, oriented, appears well hydrated and in no acute distress  HEENT: atraumatic, conjunttiva clear, no obvious abnormalities on inspection of external nose and ears  NECK: no obvious masses on inspection  LUNGS: clear to auscultation bilaterally, no wheezes, rales or rhonchi, good air movement  CV: HRRR, no peripheral edema  ABD: BS+, soft, NTTP, no rebound or guarding  MS: moves all extremities without noticeable abnormality  PSYCH: pleasant and cooperative, no obvious depression or anxiety  ASSESSMENT AND  PLAN:  Discussed the following assessment and plan:  Gastroenteritis - Plan: hyoscyamine (LEVSIN/SL) 0.125 MG SL tablet, ondansetron (ZOFRAN) 4 MG tablet, DISCONTINUED: hyoscyamine (LEVSIN/SL) 0.125 MG SL tablet, DISCONTINUED: ondansetron (ZOFRAN) 4 MG tablet  -hx and exam c/w with likely gastroenteritis, benign exam, had another episode of diarrhea when here, tolerating POs, oral rehydration and supportive care, return and emergency precuations. Offered CT and labs given hx diverticulitis but he feels this is unlikely and I agree given exam.  -Patient advised to return or notify a doctor immediately if symptoms worsen or persist or new concerns  arise.  Patient Instructions  -plenty of fluids  -zofran and levsin prn for cramping and nausea  -loperamide (imodium) for diarrhea  -no dairy  -follow up in 2 days  -if worsening, severe symptoms, can not tolerate fluids go to emergency room     KIM, Dahlia Client R.

## 2012-05-22 NOTE — Addendum Note (Signed)
Addended by: Terressa Koyanagi on: 05/22/2012 05:12 PM   Modules accepted: Orders

## 2012-10-23 ENCOUNTER — Other Ambulatory Visit (HOSPITAL_COMMUNITY): Payer: Self-pay | Admitting: Internal Medicine

## 2012-10-23 ENCOUNTER — Ambulatory Visit (HOSPITAL_COMMUNITY)
Admission: RE | Admit: 2012-10-23 | Discharge: 2012-10-23 | Disposition: A | Discharge status: Home / Self Care | Payer: 59 | Source: Ambulatory Visit | Attending: Internal Medicine | Admitting: Internal Medicine

## 2012-10-23 DIAGNOSIS — I10 Essential (primary) hypertension: Secondary | ICD-10-CM | POA: Insufficient documentation

## 2012-12-13 ENCOUNTER — Other Ambulatory Visit: Payer: Self-pay

## 2013-01-25 ENCOUNTER — Ambulatory Visit: Payer: Self-pay | Admitting: Internal Medicine

## 2013-01-29 ENCOUNTER — Encounter: Payer: Self-pay | Admitting: Internal Medicine

## 2013-01-30 ENCOUNTER — Ambulatory Visit (INDEPENDENT_AMBULATORY_CARE_PROVIDER_SITE_OTHER): Payer: 59 | Admitting: Internal Medicine

## 2013-01-30 ENCOUNTER — Encounter: Payer: Self-pay | Admitting: Internal Medicine

## 2013-01-30 VITALS — BP 124/82 | HR 88 | Temp 97.9°F | Resp 16 | Wt 215.8 lb

## 2013-01-30 DIAGNOSIS — I10 Essential (primary) hypertension: Secondary | ICD-10-CM

## 2013-01-30 DIAGNOSIS — Z79899 Other long term (current) drug therapy: Secondary | ICD-10-CM

## 2013-01-30 DIAGNOSIS — E559 Vitamin D deficiency, unspecified: Secondary | ICD-10-CM

## 2013-01-30 DIAGNOSIS — E782 Mixed hyperlipidemia: Secondary | ICD-10-CM

## 2013-01-30 DIAGNOSIS — R7309 Other abnormal glucose: Secondary | ICD-10-CM

## 2013-01-30 LAB — HEPATIC FUNCTION PANEL
Bilirubin, Direct: 0.1 mg/dL (ref 0.0–0.3)
Indirect Bilirubin: 0.3 mg/dL (ref 0.0–0.9)
Total Bilirubin: 0.4 mg/dL (ref 0.3–1.2)
Total Protein: 6.9 g/dL (ref 6.0–8.3)

## 2013-01-30 LAB — CBC WITH DIFFERENTIAL/PLATELET
Basophils Absolute: 0 10*3/uL (ref 0.0–0.1)
Basophils Relative: 0 % (ref 0–1)
MCHC: 35.8 g/dL (ref 30.0–36.0)
Neutro Abs: 4.4 10*3/uL (ref 1.7–7.7)
Neutrophils Relative %: 65 % (ref 43–77)
Platelets: 316 10*3/uL (ref 150–400)
RDW: 14.3 % (ref 11.5–15.5)
WBC: 6.7 10*3/uL (ref 4.0–10.5)

## 2013-01-30 LAB — HEMOGLOBIN A1C: Mean Plasma Glucose: 128 mg/dL — ABNORMAL HIGH (ref <117)

## 2013-01-30 LAB — BASIC METABOLIC PANEL WITH GFR
BUN: 19 mg/dL (ref 6–23)
GFR, Est African American: 72 mL/min
GFR, Est Non African American: 62 mL/min
Potassium: 4.8 mEq/L (ref 3.5–5.3)
Sodium: 137 mEq/L (ref 135–145)

## 2013-01-30 LAB — LIPID PANEL
Cholesterol: 231 mg/dL — ABNORMAL HIGH (ref 0–200)
Triglycerides: 151 mg/dL — ABNORMAL HIGH (ref <150)
VLDL: 30 mg/dL (ref 0–40)

## 2013-01-30 MED ORDER — MELOXICAM 15 MG PO TABS: ORAL_TABLET | ORAL | Status: DC

## 2013-01-30 NOTE — Progress Notes (Signed)
Patient ID: [name], male   DOB: 05/12/1955, 57 y.o.   MRN: [id number]   This very nice 57 y.o. MWM presents for 3 month follow up with Hypertension, ASCAD-S/P PTCA(2005), GERD-Hx/o Stricture, Hyperlipidemia, Pre-Diabetes and Vitamin D Deficiency.    BP has been controlled at home. Today's BP is 124/82. Patient denies any cardiac type chest pain, palpitations, dyspnea/orthopnea/PND, dizziness, claudication, or dependent edema.   Hyperlipidemia is controlled with diet & meds. Last Cholesterol was 191, Triglycerides were 89, HDL 53, and LDL 120 and his Pravastatin was changed to Atorvastatin. Patient denies myalgias or other med SE's.    Also, the patient is being monitored expectantly for PreDiabetes/insulin resistance with last A1c of 5.6% in June. Patient denies any symptoms of reactive hypoglycemia, diabetic polys, paresthesias or visual blurring.   Further, Patient has history of Vitamin D Deficiency with last vitamin D of 48 in June. Patient supplements vitamin D without any suspected side-effects.   Long discussion today about his general "Don't feel Good" attitude and he was reminded that he canceled his appointment for his sleep study. He reports he still sleeps poorly - awakening several times nightly, snores and awakens exhausted and always feels tired and "bad". 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    Medication List       aspirin 81 MG EC tablet  Take 1 tablet (81 mg total) by mouth daily. Swallow whole.     atorvastatin 80 MG tablet  Commonly known as:  LIPITOR  Takes 1/2 tab daily     esomeprazole 20 MG capsule  Commonly known as:  NEXIUM  Take 20 mg by mouth daily at 12 noon. Takes OTC 20 mg     lisinopril 10 MG tablet  Commonly known as:  PRINIVIL,ZESTRIL  Take 1 tablet (10 mg total) by mouth daily. Patient only taking when BP is high     meloxicam 15 MG tablet  Commonly  known as:  MOBIC  1/2 to 1 tablet daily with food (after supper) for neck pain and inflammation          Allergies  Allergen Reactions  . Penicillins     REACTION: unspecified    PMHx:   Past Medical History  Diagnosis Date  . ANXIETY STATE NEC 11/01/2006  . ANXIETY 12/25/2007  . CORONARY ARTERY DISEASE 08/02/2006  . DIVERTICULITIS OF COLON 01/06/2010  . DVT, HX OF 08/02/2006  . 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 HYPERLIPIDEMIA 08/02/2006  . HYPERTENSION 08/05/2008  . Memory loss 07/21/2009  . Pilonidal cyst     low back pain    FHx:    Reviewed / unchanged  SHx:    Reviewed / unchanged  Systems Review: Constitutional: Denies fever, chills, wt changes, headaches, insomnia, fatigue, night sweats, change in appetite. Eyes: Denies redness, blurred vision, diplopia, discharge, itchy, watery eyes.  ENT: Denies discharge, congestion, post nasal drip, epistaxis, sore throat, earache, hearing loss, dental pain, tinnitus, vertigo, sinus pain, snoring.  CV: Denies chest pain, palpitations, irregular heartbeat, syncope, dyspnea, diaphoresis, orthopnea, PND, claudication, edema. Respiratory: denies cough, dyspnea, DOE, pleurisy, hoarseness, laryngitis, wheezing.  Gastrointestinal: Denies dysphagia, odynophagia, heartburn, reflux, water brash, abdominal pain or cramps, nausea, vomiting, bloating, diarrhea, constipation, hematemesis, melena, hematochezia,  or hemorrhoids. Genitourinary: Denies dysuria, frequency, urgency, nocturia, hesitancy, discharge, hematuria, flank pain. Musculoskeletal: Denies arthralgias, myalgias, stiffness, jt. swelling, pain, limp, strain/sprain.  Skin: Denies pruritus,  rash, hives, warts, acne, eczema, change in skin lesion(s). Neuro: No weakness, tremor, incoordination, spasms, paresthesia, or pain. Psychiatric: Denies confusion, memory loss, or sensory loss. Endo: Denies change in weight, skin, hair change.  Heme/Lymph: No excessive bleeding, bruising, orenlarged  lymph nodes.  Filed Vitals:   01/30/13 0921  BP: 124/82  Pulse: 88  Temp: 97.9 F (36.6 C)  Resp: 16    Estimated body mass index is 30.96 kg/(m^2) as calculated from the following:   Height as of 12/27/11: 5\' 10"  (1.778 m).   Weight as of this encounter: 215 lb 12.8 oz (97.886 kg).  On Exam: Appears well nourished - in no distress. Eyes: PERRLA, EOMs, conjunctiva no swelling or erythema. Sinuses: No frontal/maxillary tenderness ENT/Mouth: EAC's clear, TM's nl w/o erythema, bulging. Nares clear w/o erythema, swelling, exudates. Oropharynx clear without erythema or exudates. Oral hygiene is good. Tongue normal, non obstructing. Hearing intact.  Neck: Supple. Thyroid nl. Car 2+/2+ without bruits, nodes or JVD. Chest: Respirations nl with BS clear & equal w/o rales, rhonchi, wheezing or stridor.  Cor: Heart sounds normal w/ regular rate and rhythm without sig. murmurs, gallops, clicks, or rubs. Peripheral pulses normal and equal  without edema.  Abdomen: Soft & bowel sounds normal. Non-tender w/o guarding, rebound, hernias, masses, or organomegaly.  Lymphatics: Unremarkable.  Musculoskeletal: Full ROM all peripheral extremities, joint stability, 5/5 strength, and normal gait.  Skin: Warm, dry without exposed rashes, lesions, ecchymosis apparent.  Neuro: Cranial nerves intact, reflexes equal bilaterally. Sensory-motor testing grossly intact. Tendon reflexes grossly intact.  Pysch: Alert & oriented x 3. Insight and judgement self- serving. Concrete Ideations.  Assessment and Plan:  1. Hypertension & ASCAD-S/P Stents - Continue monitor blood pressure at home. Continue diet/meds same.  2. Hyperlipidemia - Continue diet/meds, exercise,& lifestyle modifications. Continue monitor periodic cholesterol/liver & renal functions   3. Pre-diabetes/Insulin Resistance - Monitoring expectantly. Continue diet, exercise, lifestyle modifications. Monitor appropriate labs.  4. Vitamin D Deficiency -  Continue supplementation.  5. 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 Recommended labs to assess and monitor clinical status. Further disposition pending results of labs.

## 2013-01-30 NOTE — Patient Instructions (Signed)
Continue diet & medications same as discussed.   Further disposition pending lab results.     Cholesterol Cholesterol is a white, waxy, fat-like protein needed by your body in small amounts. The liver makes all the cholesterol you need. It is carried from the liver by the blood through the blood vessels. Deposits (plaque) may build up on blood vessel walls. This makes the arteries narrower and stiffer. Plaque increases the risk for heart attack and stroke. You cannot feel your cholesterol level even if it is very high. The only way to know is by a blood test to check your lipid (fats) levels. Once you know your cholesterol levels, you should keep a record of the test results. Work with your caregiver to to keep your levels in the desired range. WHAT THE RESULTS MEAN:  Total cholesterol is a rough measure of all the cholesterol in your blood.  LDL is the so-called bad cholesterol. This is the type that deposits cholesterol in the walls of the arteries. You want this level to be low.  HDL is the good cholesterol because it cleans the arteries and carries the LDL away. You want this level to be high.  Triglycerides are fat that the body can either burn for energy or store. High levels are closely linked to heart disease. DESIRED LEVELS:  Total cholesterol below 200.  LDL below 100 for people at risk, below 70 for very high risk.  HDL above 50 is good, above 60 is best.  Triglycerides below 150. HOW TO LOWER YOUR CHOLESTEROL:  Diet.  Choose fish or white meat chicken and [place], roasted or baked. Limit fatty cuts of red meat, fried foods, and processed meats, such as sausage and lunch meat.  Eat lots of fresh fruits and vegetables. Choose whole grains, beans, pasta, potatoes and cereals.  Use only small amounts of olive, corn or canola oils. Avoid butter, mayonnaise, shortening or palm kernel oils. Avoid foods with trans-fats.  Use skim/nonfat milk and low-fat/nonfat yogurt and  cheeses. Avoid whole milk, cream, ice cream, egg yolks and cheeses. Healthy desserts include angel food cake, ginger snaps, animal crackers, hard candy, popsicles, and low-fat/nonfat frozen yogurt. Avoid pastries, cakes, pies and cookies.  Exercise.  A regular program helps decrease LDL and raises HDL.  Helps with weight control.  Do things that increase your activity level like gardening, walking, or taking the stairs.  Medication.  May be prescribed by your caregiver to help lowering cholesterol and the risk for heart disease.  You may need medicine even if your levels are normal if you have several risk factors. HOME CARE INSTRUCTIONS   Follow your diet and exercise programs as suggested by your caregiver.  Take medications as directed.  Have blood work done when your caregiver feels it is necessary. MAKE SURE YOU:   Understand these instructions.  Will watch your condition.  Will get help right away if you are not doing well or get worse. Document Released: 10/19/2000 Document Revised: 04/18/2011 Document Reviewed: 04/11/2007 [facility] Patient Information 2014 [place], [place]. Gastroesophageal Reflux Disease, Adult Gastroesophageal reflux disease (GERD) happens when acid from your stomach flows up into the esophagus. When acid comes in contact with the esophagus, the acid causes soreness (inflammation) in the esophagus. Over time, GERD may create small holes (ulcers) in the lining of the esophagus. CAUSES   Increased body weight. This puts pressure on the stomach, making acid rise from the stomach into the esophagus.  Smoking. This increases acid production in  the stomach.  Drinking alcohol. This causes decreased pressure in the lower esophageal sphincter (valve or ring of muscle between the esophagus and stomach), allowing acid from the stomach into the esophagus.  Late evening meals and a full stomach. This increases pressure and acid production in the stomach.  A  malformed lower esophageal sphincter. Sometimes, no cause is found. SYMPTOMS   Burning pain in the lower part of the mid-chest behind the breastbone and in the mid-stomach area. This may occur twice a week or more often.  Trouble swallowing.  Sore throat.  Dry cough.  Asthma-like symptoms including chest tightness, shortness of breath, or wheezing. DIAGNOSIS  Your caregiver may be able to diagnose GERD based on your symptoms. In some cases, X-rays and other tests may be done to check for complications or to check the condition of your stomach and esophagus. TREATMENT  Your caregiver may recommend over-the-counter or prescription medicines to help decrease acid production. Ask your caregiver before starting or adding any new medicines.  HOME CARE INSTRUCTIONS   Change the factors that you can control. Ask your caregiver for guidance concerning weight loss, quitting smoking, and alcohol consumption.  Avoid foods and drinks that make your symptoms worse, such as:  Caffeine or alcoholic drinks.  Chocolate.  Peppermint or mint flavorings.  Garlic and onions.  Spicy foods.  Citrus fruits, such as oranges, lemons, or limes.  Tomato-based foods such as sauce, chili, salsa, and pizza.  Fried and fatty foods.  Avoid lying down for the 3 hours prior to your bedtime or prior to taking a nap.  Eat small, frequent meals instead of large meals.  Wear loose-fitting clothing. Do not wear anything tight around your waist that causes pressure on your stomach.  Raise the head of your bed 6 to 8 inches with wood blocks to help you sleep. Extra pillows will not help.  Only take over-the-counter or prescription medicines for pain, discomfort, or fever as directed by your caregiver.  Do not take aspirin, ibuprofen, or other nonsteroidal anti-inflammatory drugs (NSAIDs). SEEK IMMEDIATE MEDICAL CARE IF:   You have pain in your arms, neck, jaw, teeth, or back.  Your pain increases or  changes in intensity or duration.  You develop nausea, vomiting, or sweating (diaphoresis).  You develop shortness of breath, or you faint.  Your vomit is green, yellow, black, or looks like coffee grounds or blood.  Your stool is red, bloody, or black. These symptoms could be signs of other problems, such as heart disease, gastric bleeding, or esophageal bleeding. MAKE SURE YOU:   Understand these instructions.  Will watch your condition.  Will get help right away if you are not doing well or get worse. Document Released: 11/03/2004 Document Revised: 04/18/2011 Document Reviewed: 08/13/2010 [facility] Patient Information 2014 [place], [place]. Hypertension As your heart beats, it forces blood through your arteries. This force is your blood pressure. If the pressure is too high, it is called hypertension (HTN) or high blood pressure. HTN is dangerous because you may have it and not know it. High blood pressure may mean that your heart has to work harder to pump blood. Your arteries may be narrow or stiff. The extra work puts you at risk for heart disease, stroke, and other problems.  Blood pressure consists of two numbers, a higher number over a lower, 110/72, for example. It is stated as "110 over 72." The ideal is below 120 for the top number (systolic) and under 80 for the bottom (diastolic). Write  down your blood pressure today. You should pay close attention to your blood pressure if you have certain conditions such as:  Heart failure.  Prior heart attack.  Diabetes  Chronic kidney disease.  Prior stroke.  Multiple risk factors for heart disease. To see if you have HTN, your blood pressure should be measured while you are seated with your arm held at the level of the heart. It should be measured at least twice. A one-time elevated blood pressure reading (especially in the Emergency Department) does not mean that you need treatment. There may be conditions in which the blood  pressure is different between your right and left arms. It is important to see your caregiver soon for a recheck. Most people have essential hypertension which means that there is not a specific cause. This type of high blood pressure may be lowered by changing lifestyle factors such as:  Stress.  Smoking.  Lack of exercise.  Excessive weight.  Drug/tobacco/alcohol use.  Eating less salt. Most people do not have symptoms from high blood pressure until it has caused damage to the body. Effective treatment can often prevent, delay or reduce that damage. TREATMENT  When a cause has been identified, treatment for high blood pressure is directed at the cause. There are a large number of medications to treat HTN. These fall into several categories, and your caregiver will help you select the medicines that are best for you. Medications may have side effects. You should review side effects with your caregiver. If your blood pressure stays high after you have made lifestyle changes or started on medicines,   Your medication(s) may need to be changed.  Other problems may need to be addressed.  Be certain you understand your prescriptions, and know how and when to take your medicine.  Be sure to follow up with your caregiver within the time frame advised (usually within two weeks) to have your blood pressure rechecked and to review your medications.  If you are taking more than one medicine to lower your blood pressure, make sure you know how and at what times they should be taken. Taking two medicines at the same time can result in blood pressure that is too low. SEEK IMMEDIATE MEDICAL CARE IF:  You develop a severe headache, blurred or changing vision, or confusion.  You have unusual weakness or numbness, or a faint feeling.  You have severe chest or abdominal pain, vomiting, or breathing problems. MAKE SURE YOU:   Understand these instructions.  Will watch your condition.  Will get  help right away if you are not doing well or get worse. Document Released: 01/24/2005 Document Revised: 04/18/2011 Document Reviewed: 09/14/2007 [facility] Patient Information 2014 [place], [place]. Diabetes and Exercise Exercising regularly is important. It is not just about losing weight. It has many health benefits, such as:  Improving your overall fitness, flexibility, and endurance.  Increasing your bone density.  Helping with weight control.  Decreasing your body fat.  Increasing your muscle strength.  Reducing stress and tension.  Improving your overall health. People with diabetes who exercise gain additional benefits because exercise:  Reduces appetite.  Improves the body's use of blood sugar (glucose).  Helps lower or control blood glucose.  Decreases blood pressure.  Helps control blood lipids (such as cholesterol and triglycerides).  Improves the body's use of the hormone insulin by:  Increasing the body's insulin sensitivity.  Reducing the body's insulin needs.  Decreases the risk for heart disease because exercising:  Lowers cholesterol  and triglycerides levels.  Increases the levels of good cholesterol (such as high-density lipoproteins [HDL]) in the body.  Lowers blood glucose levels. YOUR ACTIVITY PLAN  Choose an activity that you enjoy and set realistic goals. Your health care provider or diabetes educator can help you make an activity plan that works for you. You can break activities into 2 or 3 sessions throughout the day. Doing so is as good as one long session. Exercise ideas include:  Taking the dog for a walk.  Taking the stairs instead of the elevator.  Dancing to your favorite song.  Doing your favorite exercise with a friend. RECOMMENDATIONS FOR EXERCISING WITH TYPE 1 OR TYPE 2 DIABETES   Check your blood glucose before exercising. If blood glucose levels are greater than 240 mg/dL, check for urine ketones. Do not exercise if ketones are  present.  Avoid injecting insulin into areas of the body that are going to be exercised. For example, avoid injecting insulin into:  The arms when playing tennis.  The legs when jogging.  Keep a record of:  Food intake before and after you exercise.  Expected peak times of insulin action.  Blood glucose levels before and after you exercise.  The type and amount of exercise you have done.  Review your records with your health care provider. Your health care provider will help you to develop guidelines for adjusting food intake and insulin amounts before and after exercising.  If you take insulin or oral hypoglycemic agents, watch for signs and symptoms of hypoglycemia. They include:  Dizziness.  Shaking.  Sweating.  Chills.  Confusion.  Drink plenty of water while you exercise to prevent dehydration or heat stroke. Body water is lost during exercise and must be replaced.  Talk to your health care provider before starting an exercise program to make sure it is safe for you. Remember, almost any type of activity is better than none. Document Released: 04/16/2003 Document Revised: 09/26/2012 Document Reviewed: 07/03/2012 [facility] Patient Information 2014 [place], [place]. Cholesterol Cholesterol is a white, waxy, fat-like protein needed by your body in small amounts. The liver makes all the cholesterol you need. It is carried from the liver by the blood through the blood vessels. Deposits (plaque) may build up on blood vessel walls. This makes the arteries narrower and stiffer. Plaque increases the risk for heart attack and stroke. You cannot feel your cholesterol level even if it is very high. The only way to know is by a blood test to check your lipid (fats) levels. Once you know your cholesterol levels, you should keep a record of the test results. Work with your caregiver to to keep your levels in the desired range. WHAT THE RESULTS MEAN:  Total cholesterol is a rough measure  of all the cholesterol in your blood.  LDL is the so-called bad cholesterol. This is the type that deposits cholesterol in the walls of the arteries. You want this level to be low.  HDL is the good cholesterol because it cleans the arteries and carries the LDL away. You want this level to be high.  Triglycerides are fat that the body can either burn for energy or store. High levels are closely linked to heart disease. DESIRED LEVELS:  Total cholesterol below 200.  LDL below 100 for people at risk, below 70 for very high risk.  HDL above 50 is good, above 60 is best.  Triglycerides below 150. HOW TO LOWER YOUR CHOLESTEROL:  Diet.  Choose fish or white  meat chicken and [place], roasted or baked. Limit fatty cuts of red meat, fried foods, and processed meats, such as sausage and lunch meat.  Eat lots of fresh fruits and vegetables. Choose whole grains, beans, pasta, potatoes and cereals.  Use only small amounts of olive, corn or canola oils. Avoid butter, mayonnaise, shortening or palm kernel oils. Avoid foods with trans-fats.  Use skim/nonfat milk and low-fat/nonfat yogurt and cheeses. Avoid whole milk, cream, ice cream, egg yolks and cheeses. Healthy desserts include angel food cake, ginger snaps, animal crackers, hard candy, popsicles, and low-fat/nonfat frozen yogurt. Avoid pastries, cakes, pies and cookies.  Exercise.  A regular program helps decrease LDL and raises HDL.  Helps with weight control.  Do things that increase your activity level like gardening, walking, or taking the stairs.  Medication.  May be prescribed by your caregiver to help lowering cholesterol and the risk for heart disease.  You may need medicine even if your levels are normal if you have several risk factors. HOME CARE INSTRUCTIONS   Follow your diet and exercise programs as suggested by your caregiver.  Take medications as directed.  Have blood work done when your caregiver feels it is  necessary. MAKE SURE YOU:   Understand these instructions.  Will watch your condition.  Will get help right away if you are not doing well or get worse. Document Released: 10/19/2000 Document Revised: 04/18/2011 Document Reviewed: 04/11/2007 [facility] Patient Information 2014 [place], [place]. Vitamin D Deficiency Vitamin D is an important vitamin that your body needs. Having too little of it in your body is called a deficiency. A very bad deficiency can make your bones soft and can cause a condition called rickets.  Vitamin D is important to your body for different reasons, such as:   It helps your body absorb 2 minerals called calcium and phosphorus.  It helps make your bones healthy.  It may prevent some diseases, such as diabetes and multiple sclerosis.  It helps your muscles and heart. You can get vitamin D in several ways. It is a natural part of some foods. The vitamin is also added to some dairy products and cereals. Some people take vitamin D supplements. Also, your body makes vitamin D when you are in the sun. It changes the sun's rays into a form of the vitamin that your body can use. CAUSES   Not eating enough foods that contain vitamin D.  Not getting enough sunlight.  Having certain digestive system diseases that make it hard to absorb vitamin D. These diseases include Crohn's disease, chronic pancreatitis, and cystic fibrosis.  Having a surgery in which part of the stomach or small intestine is removed.  Being obese. Fat cells pull vitamin D out of your blood. That means that obese people may not have enough vitamin D left in their blood and in other body tissues.  Having chronic kidney or liver disease. RISK FACTORS Risk factors are things that make you more likely to develop a vitamin D deficiency. They include:  Being older.  Not being able to get outside very much.  Living in a nursing home.  Having had broken bones.  Having weak or thin bones  (osteoporosis).  Having a disease or condition that changes how your body absorbs vitamin D.  Having dark skin.  Some medicines such as seizure medicines or steroids.  Being overweight or obese. SYMPTOMS Mild cases of vitamin D deficiency may not have any symptoms. If you have a very bad case, symptoms  may include:  Bone pain.  Muscle pain.  Falling often.  Broken bones caused by a minor injury, due to osteoporosis. DIAGNOSIS A blood test is the best way to tell if you have a vitamin D deficiency. TREATMENT Vitamin D deficiency can be treated in different ways. Treatment for vitamin D deficiency depends on what is causing it. Options include:  Taking vitamin D supplements.  Taking a calcium supplement. Your caregiver will suggest what dose is best for you. HOME CARE INSTRUCTIONS  Take any supplements that your caregiver prescribes. Follow the directions carefully. Take only the suggested amount.  Have your blood tested 2 months after you start taking supplements.  Eat foods that contain vitamin D. Healthy choices include:  Fortified dairy products, cereals, or juices. Fortified means vitamin D has been added to the food. Check the label on the package to be sure.  Fatty fish like salmon or trout.  Eggs.  Oysters.  Do not use a tanning bed.  Keep your weight at a healthy level. Lose weight if you need to.  Keep all follow-up appointments. Your caregiver will need to perform blood tests to make sure your vitamin D deficiency is going away. SEEK MEDICAL CARE IF:  You have any questions about your treatment.  You continue to have symptoms of vitamin D deficiency.  You have nausea or vomiting.  You are constipated.  You feel confused.  You have severe abdominal or back pain. MAKE SURE YOU:  Understand these instructions.  Will watch your condition.  Will get help right away if you are not doing well or get worse. Document Released: 04/18/2011 Document  Revised: 05/21/2012 Document Reviewed: 04/18/2011 [facility] Patient Information 2014 [place], [place].

## 2013-02-06 ENCOUNTER — Other Ambulatory Visit: Payer: Self-pay | Admitting: Internal Medicine

## 2013-03-01 ENCOUNTER — Other Ambulatory Visit: Payer: Self-pay | Admitting: Internal Medicine

## 2013-03-08 ENCOUNTER — Emergency Department (HOSPITAL_COMMUNITY): Payer: 59

## 2013-03-08 ENCOUNTER — Encounter (HOSPITAL_COMMUNITY): Payer: Self-pay | Admitting: Emergency Medicine

## 2013-03-08 ENCOUNTER — Observation Stay (HOSPITAL_COMMUNITY)
Admission: EM | Admit: 2013-03-08 | Discharge: 2013-03-09 | Disposition: A | Discharge status: Home / Self Care | Payer: 59 | Attending: Internal Medicine | Admitting: Internal Medicine

## 2013-03-08 DIAGNOSIS — E785 Hyperlipidemia, unspecified: Secondary | ICD-10-CM | POA: Insufficient documentation

## 2013-03-08 DIAGNOSIS — I2510 Atherosclerotic heart disease of native coronary artery without angina pectoris: Secondary | ICD-10-CM | POA: Insufficient documentation

## 2013-03-08 DIAGNOSIS — R209 Unspecified disturbances of skin sensation: Secondary | ICD-10-CM | POA: Insufficient documentation

## 2013-03-08 DIAGNOSIS — Z86718 Personal history of other venous thrombosis and embolism: Secondary | ICD-10-CM | POA: Insufficient documentation

## 2013-03-08 DIAGNOSIS — R0789 Other chest pain: Secondary | ICD-10-CM | POA: Insufficient documentation

## 2013-03-08 DIAGNOSIS — Z7982 Long term (current) use of aspirin: Secondary | ICD-10-CM | POA: Insufficient documentation

## 2013-03-08 DIAGNOSIS — F411 Generalized anxiety disorder: Secondary | ICD-10-CM

## 2013-03-08 DIAGNOSIS — M7918 Myalgia, other site: Secondary | ICD-10-CM

## 2013-03-08 DIAGNOSIS — IMO0001 Reserved for inherently not codable concepts without codable children: Principal | ICD-10-CM | POA: Insufficient documentation

## 2013-03-08 DIAGNOSIS — I25810 Atherosclerosis of coronary artery bypass graft(s) without angina pectoris: Secondary | ICD-10-CM | POA: Diagnosis present

## 2013-03-08 DIAGNOSIS — K222 Esophageal obstruction: Secondary | ICD-10-CM

## 2013-03-08 DIAGNOSIS — R9431 Abnormal electrocardiogram [ECG] [EKG]: Secondary | ICD-10-CM | POA: Insufficient documentation

## 2013-03-08 DIAGNOSIS — R079 Chest pain, unspecified: Secondary | ICD-10-CM

## 2013-03-08 DIAGNOSIS — Z9861 Coronary angioplasty status: Secondary | ICD-10-CM | POA: Insufficient documentation

## 2013-03-08 DIAGNOSIS — K219 Gastro-esophageal reflux disease without esophagitis: Secondary | ICD-10-CM | POA: Insufficient documentation

## 2013-03-08 DIAGNOSIS — Z79899 Other long term (current) drug therapy: Secondary | ICD-10-CM | POA: Insufficient documentation

## 2013-03-08 DIAGNOSIS — E782 Mixed hyperlipidemia: Secondary | ICD-10-CM | POA: Diagnosis present

## 2013-03-08 DIAGNOSIS — M549 Dorsalgia, unspecified: Secondary | ICD-10-CM

## 2013-03-08 DIAGNOSIS — K6289 Other specified diseases of anus and rectum: Secondary | ICD-10-CM

## 2013-03-08 DIAGNOSIS — I10 Essential (primary) hypertension: Secondary | ICD-10-CM | POA: Diagnosis present

## 2013-03-08 LAB — COMPREHENSIVE METABOLIC PANEL
ALBUMIN: 3.8 g/dL (ref 3.5–5.2)
ALT: 41 U/L (ref 0–53)
AST: 28 U/L (ref 0–37)
Alkaline Phosphatase: 62 U/L (ref 39–117)
BILIRUBIN TOTAL: 0.2 mg/dL — AB (ref 0.3–1.2)
BUN: 20 mg/dL (ref 6–23)
CALCIUM: 9.1 mg/dL (ref 8.4–10.5)
CO2: 23 mEq/L (ref 19–32)
CREATININE: 1.26 mg/dL (ref 0.50–1.35)
Chloride: 103 mEq/L (ref 96–112)
GFR calc Af Amer: 70 mL/min — ABNORMAL LOW (ref >90)
GFR calc non Af Amer: 61 mL/min — ABNORMAL LOW (ref >90)
Glucose, Bld: 161 mg/dL — ABNORMAL HIGH (ref 70–99)
Potassium: 4.2 mEq/L (ref 3.7–5.3)
Sodium: 139 mEq/L (ref 137–147)
TOTAL PROTEIN: 6.8 g/dL (ref 6.0–8.3)

## 2013-03-08 LAB — CBC WITH DIFFERENTIAL/PLATELET
BASOS ABS: 0 10*3/uL (ref 0.0–0.1)
BASOS PCT: 0 % (ref 0–1)
EOS ABS: 0.2 10*3/uL (ref 0.0–0.7)
EOS PCT: 2 % (ref 0–5)
HEMATOCRIT: 40.4 % (ref 39.0–52.0)
HEMOGLOBIN: 13.7 g/dL (ref 13.0–17.0)
Lymphocytes Relative: 22 % (ref 12–46)
Lymphs Abs: 1.7 10*3/uL (ref 0.7–4.0)
MCH: 30.3 pg (ref 26.0–34.0)
MCHC: 33.9 g/dL (ref 30.0–36.0)
MCV: 89.4 fL (ref 78.0–100.0)
MONO ABS: 0.8 10*3/uL (ref 0.1–1.0)
MONOS PCT: 10 % (ref 3–12)
NEUTROS ABS: 5 10*3/uL (ref 1.7–7.7)
Neutrophils Relative %: 65 % (ref 43–77)
Platelets: 282 10*3/uL (ref 150–400)
RBC: 4.52 MIL/uL (ref 4.22–5.81)
RDW: 13.6 % (ref 11.5–15.5)
WBC: 7.7 10*3/uL (ref 4.0–10.5)

## 2013-03-08 LAB — POCT I-STAT TROPONIN I: Troponin i, poc: 0 ng/mL (ref 0.00–0.08)

## 2013-03-08 LAB — TROPONIN I: Troponin I: <0.3 ng/mL (ref <0.30)

## 2013-03-08 LAB — LIPASE, BLOOD: LIPASE: 22 U/L (ref 11–59)

## 2013-03-08 MED ORDER — ASPIRIN 325 MG PO TABS
325.0000 mg | ORAL_TABLET | Freq: Every day | ORAL | Status: DC
  Filled 2013-03-08: qty 1

## 2013-03-08 MED ORDER — HYDROCODONE-ACETAMINOPHEN 5-325 MG PO TABS: 1.0000 | ORAL_TABLET | Freq: Four times a day (QID) | ORAL | Status: DC | PRN

## 2013-03-08 MED ORDER — GI COCKTAIL ~~LOC~~
30.0000 mL | Freq: Four times a day (QID) | ORAL | Status: DC | PRN
  Filled 2013-03-08: qty 30

## 2013-03-08 MED ORDER — MORPHINE SULFATE 2 MG/ML IJ SOLN: 2.0000 mg | INTRAMUSCULAR | Status: DC | PRN

## 2013-03-08 MED ORDER — ONDANSETRON HCL 4 MG/2ML IJ SOLN: 4.0000 mg | Freq: Four times a day (QID) | INTRAMUSCULAR | Status: DC | PRN

## 2013-03-08 MED ORDER — ATORVASTATIN CALCIUM 40 MG PO TABS
40.0000 mg | ORAL_TABLET | Freq: Every day | ORAL | Status: DC
  Filled 2013-03-08: qty 1

## 2013-03-08 MED ORDER — PANTOPRAZOLE SODIUM 40 MG PO TBEC
40.0000 mg | DELAYED_RELEASE_TABLET | Freq: Every day | ORAL | Status: DC
  Administered 2013-03-08: 40 mg via ORAL
  Filled 2013-03-08 (×2): qty 1

## 2013-03-08 MED ORDER — LISINOPRIL 10 MG PO TABS
10.0000 mg | ORAL_TABLET | Freq: Every day | ORAL | Status: DC
  Filled 2013-03-08: qty 1

## 2013-03-08 MED ORDER — LORAZEPAM 0.5 MG PO TABS
0.5000 mg | ORAL_TABLET | Freq: Once | ORAL | Status: AC
  Administered 2013-03-08: 0.5 mg via ORAL
  Filled 2013-03-08: qty 1

## 2013-03-08 MED ORDER — NITROGLYCERIN 0.4 MG SL SUBL: 0.4000 mg | SUBLINGUAL_TABLET | SUBLINGUAL | Status: DC | PRN

## 2013-03-08 MED ORDER — ASPIRIN 81 MG PO CHEW
324.0000 mg | CHEWABLE_TABLET | Freq: Once | ORAL | Status: AC
  Administered 2013-03-08: 324 mg via ORAL
  Filled 2013-03-08: qty 4

## 2013-03-08 MED ORDER — ACETAMINOPHEN 325 MG PO TABS: 650.0000 mg | ORAL_TABLET | ORAL | Status: DC | PRN

## 2013-03-08 MED ORDER — NITROGLYCERIN 0.4 MG SL SUBL
0.4000 mg | SUBLINGUAL_TABLET | SUBLINGUAL | Status: DC | PRN
  Administered 2013-03-08: 0.4 mg via SUBLINGUAL
  Filled 2013-03-08: qty 25

## 2013-03-08 MED ORDER — ENOXAPARIN SODIUM 40 MG/0.4ML ~~LOC~~ SOLN
40.0000 mg | SUBCUTANEOUS | Status: DC
  Administered 2013-03-08: 40 mg via SUBCUTANEOUS
  Filled 2013-03-08 (×2): qty 0.4

## 2013-03-08 NOTE — ED Notes (Signed)
Pt from home, c/o pain between shoulder blades and L arm pain x3 weeks. Pt was prescribed mobic, but has not taken. Pt had cardiac cath 2005 and had the same pain in upper back between shoulder blades. Pt deneis N/V/D, fever, CP, SOB, diaphoresis. Pt is A&O and in NAD

## 2013-03-08 NOTE — ED Notes (Signed)
Phlebotomy verbalizes will collect labs.

## 2013-03-08 NOTE — ED Provider Notes (Signed)
TIME SEEN: 4:58 PM  CHIEF COMPLAINT: Back pain, left arm pain, chest pain  HPI: Patient is a 58 year old male with history of CAD status post stent in 2005, hypertension, hyperlipidemia presents emergency 3 weeks of intermittent stabbing pain between his shoulder blades and chest discomfort and left arm numbness. He states his symptoms are similar to pain that he had when he needed his stent. He states that it is not as severe. He denies any shortness of breath, diaphoresis, nausea or vomiting. He states it is worse at night and this is similar to his pain in 2005. He also reports he's had several weeks of feeling very fatigued, weak. He has no back injury. No numbness, tingling or focal weakness. No bowel or bladder incontinence. No fevers or cough. No prior history of PE or DVT, recent prolonged immobilization, lower extremity swelling or pain, recent surgery, trauma, fracture.  ROS: See HPI Constitutional: no fever  Eyes: no drainage  ENT: no runny nose   Cardiovascular:  chest pain  Resp: no SOB  GI: no vomiting GU: no dysuria Integumentary: no rash  Allergy: no hives  Musculoskeletal: no leg swelling  Neurological: no slurred speech ROS otherwise negative  PAST MEDICAL HISTORY/PAST SURGICAL HISTORY:  Past Medical History  Diagnosis Date  . ANXIETY STATE NEC 11/01/2006  . ANXIETY 12/25/2007  . CORONARY ARTERY DISEASE 08/02/2006    off Plavix x 3 years  . DIVERTICULITIS OF COLON 01/06/2010  . DVT, HX OF 08/02/2006  . 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 HYPERLIPIDEMIA 08/02/2006  . HYPERTENSION 08/05/2008  . Memory loss 07/21/2009  . Pilonidal cyst     low back pain    MEDICATIONS:  Prior to Admission medications   Medication Sig Start Date End Date Taking? Authorizing Provider  aspirin EC 81 MG tablet Take 243 mg by mouth daily.   Yes Historical Provider, MD  atorvastatin (LIPITOR) 80 MG tablet Take 40 mg by mouth daily.   Yes Historical Provider, MD  Cholecalciferol (VITAMIN D-3)  5000 UNITS TABS Take 1 tablet by mouth daily.   Yes Historical Provider, MD  esomeprazole (NEXIUM) 20 MG capsule Take 20 mg by mouth daily at 12 noon. Takes OTC 20 mg   Yes Historical Provider, MD  lisinopril (PRINIVIL,ZESTRIL) 10 MG tablet Take one tablet by mouth one time daily. Onlt take when blood pressure is high. 03/01/13  Yes Marletta Lor, MD  meloxicam [facility]) 15 MG tablet 1/2 to 1 tablet daily with food (after supper) for neck pain and inflammation 01/30/13  Yes Unk Pinto, MD    ALLERGIES:  Allergies  Allergen Reactions  . Penicillins     unspecified    SOCIAL HISTORY:  History  Substance Use Topics  . Smoking status: Never Smoker   . Smokeless tobacco: Never Used  . Alcohol Use: Yes     Comment: rarely    FAMILY HISTORY: Family History  Problem Relation Age of Onset  . Colon cancer Father     deceased age 56  . Dementia Mother     EXAM: BP 146/87  Pulse 84  Temp(Src) 97.6 F (36.4 C) (Oral)  Resp 12  SpO2 98% CONSTITUTIONAL: Alert and oriented and responds appropriately to questions. Well-appearing; well-nourished HEAD: Normocephalic EYES: Conjunctivae clear, PERRL ENT: normal nose; no rhinorrhea; moist mucous membranes; pharynx without lesions noted NECK: Supple, no meningismus, no LAD  CARD: RRR; S1 and S2 appreciated; no murmurs, no clicks, no rubs, no gallops RESP: Normal chest excursion without splinting or  tachypnea; breath sounds clear and equal bilaterally; no wheezes, no rhonchi, no rales,  ABD/GI: Normal bowel sounds; non-distended; soft, non-tender, no rebound, no guarding BACK:  The back appears normal and is non-tender to palpation, there is no CVA tenderness, no midline spinal tenderness or step-off or deformity EXT: Normal ROM in all joints; non-tender to palpation; no edema; normal capillary refill; no cyanosis    SKIN: Normal color for age and race; warm NEURO: Moves all extremities equally, sensation to light touch intact  diffusely, cranial nerves II through XII intact PSYCH: The patient's mood and manner are appropriate. Grooming and personal hygiene are appropriate.  MEDICAL DECISION MAKING: Patient here with back pain, chest pain and left arm numbness his been intermittent for the past 2 weeks. Consider this may be his anginal color. EKG shows no new ischemic changes. He is hemodynamically stable. We'll obtain cardiac labs, x-ray. Have recommended admission. Will give aspirin and nitroglycerin to get patient pain free. I am not concerned for dissection at this time as patient does not describe it. Patient appears comfortable on exam and has equal pulses in all extremities. No risk factors for pulmonary embolus.  ED PROGRESS: Pt's labs, CXR reassuring.  Troponin negative.  Pt is pain free after 1 NTG tablet.  Will d/w hospitalist for admission.      EKG Interpretation    Date/Time:  Friday March 08 2013 15:39:13 EST Ventricular Rate:  87 PR Interval:  138 QRS Duration: 76 QT Interval:  353 QTC Calculation: 425 R Axis:   46 Text Interpretation:  Sinus rhythm Abnormal R-wave progression, early transition Confirmed by Dicky Boer  DO, Xavien Dauphinais ([id number]) on 03/08/2013 4:11:28 PM              [place], DO 03/08/13 1815

## 2013-03-08 NOTE — H&P (Signed)
[facility] History and Physical  [name] KDT:[id number] DOB: December 18, 1955 DOA: 03/08/2013  Referring physician:  PCP: Alesia Richards, MD   Chief Complaint: Left arm pain  HPI: [name] is a 58 y.o. male with a past medical history of coronary artery disease, status post percutaneous intervention in 2005, hypertension, dyslipidemia, presented to the emergency department with complaints of left arm discomfort and intrascapular pain. He reports having these symptoms intermittently over the past 3 weeks, typically lasting 20-30 minutes, back pain characterized as sharp and stabbing, left arm discomfort characterized as mild and achy. Overnight pain symptoms worsening. Physical exertion does not precipitate symptoms, as it occurs usually while at rest. He denies associated nausea, vomiting, diaphoresis, palpitations, dizziness, lightheadedness, syncope or presyncope. He does state that he presented in a similar fashion packing 2005 when he underwent cardiac catheterization with PCI. EKG did not reveal ischemic changes as a troponin was within normal limits. Patient is chest pain-free during my encounter.                                                                                                               Review of Systems:  Constitutional:  No weight loss, night sweats, Fevers, chills, fatigue.  HEENT:  No headaches, Difficulty swallowing,Tooth/dental problems,Sore throat,  No sneezing, itching, ear ache, nasal congestion, post nasal drip,  Cardio-vascular:  No chest pain, Orthopnea, PND, swelling in lower extremities, anasarca, dizziness, palpitations  GI:  No heartburn, indigestion, abdominal pain, nausea, vomiting, diarrhea, change in bowel habits, loss of appetite  Resp:  No shortness of breath with exertion or at rest. No excess mucus, no productive cough, No non-productive cough, No coughing up of blood.No change in color of mucus.No wheezing.No chest wall  deformity  Skin:  no rash or lesions.  GU:  no dysuria, change in color of urine, no urgency or frequency. No flank pain.  Musculoskeletal:  No joint pain or swelling. No decreased range of motion. Positive for left arm discomfort and intrascapular area pain.  Psych:  No change in mood or affect. No depression or anxiety. No memory loss.   Past Medical History  Diagnosis Date  . ANXIETY STATE NEC 11/01/2006  . ANXIETY 12/25/2007  . CORONARY ARTERY DISEASE 08/02/2006    off Plavix x 3 years  . DIVERTICULITIS OF COLON 01/06/2010  . DVT, HX OF 08/02/2006  . 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 HYPERLIPIDEMIA 08/02/2006  . HYPERTENSION 08/05/2008  . Memory loss 07/21/2009  . Pilonidal cyst     low back pain   Past Surgical History  Procedure Laterality Date  . Coronary angioplasty with stent placement    . Pilonidal cyst excision      teenager  . Esophagogastroduodenoscopy  06/16/2010    antral ulcers, esophageal stricture - dilated, duodenitis, hiatal hernia  . Colonoscopy w/ polypectomy  06/16/2010    diminutive adenoma, diverticulosis, hemorrhoids   Social History:  reports that he has never smoked. He has never used smokeless tobacco. He reports that he drinks alcohol.  He reports that he uses illicit drugs.  Allergies  Allergen Reactions  . Penicillins     unspecified    Family History  Problem Relation Age of Onset  . Colon cancer Father     deceased age 60  . Dementia Mother      Prior to Admission medications   Medication Sig Start Date End Date Taking? Authorizing Provider  aspirin EC 81 MG tablet Take 243 mg by mouth daily.   Yes Historical Provider, MD  atorvastatin (LIPITOR) 80 MG tablet Take 40 mg by mouth daily.   Yes Historical Provider, MD  Cholecalciferol (VITAMIN D-3) 5000 UNITS TABS Take 1 tablet by mouth daily.   Yes Historical Provider, MD  esomeprazole (NEXIUM) 20 MG capsule Take 20 mg by mouth daily at 12 noon. Takes OTC 20 mg   Yes Historical Provider, MD   lisinopril (PRINIVIL,ZESTRIL) 10 MG tablet Take one tablet by mouth one time daily. Onlt take when blood pressure is high. 03/01/13  Yes Marletta Lor, MD  meloxicam [facility]) 15 MG tablet 1/2 to 1 tablet daily with food (after supper) for neck pain and inflammation 01/30/13  Yes Unk Pinto, MD   Physical Exam: Filed Vitals:   03/08/13 1800  BP: 111/75  Pulse: 74  Temp:   Resp:     BP 111/75  Pulse 74  Temp(Src) 97.6 F (36.4 C) (Oral)  Resp 12  SpO2 97%  General:  Appears calm and comfortable Eyes: PERRL, normal lids, irises & conjunctiva ENT: grossly normal hearing, lips & tongue Neck: no LAD, masses or thyromegaly Cardiovascular: RRR, no m/r/g. No LE edema. Telemetry: SR, no arrhythmias  Respiratory: CTA bilaterally, no w/r/r. Normal respiratory effort. Abdomen: soft, ntnd Skin: no rash or induration seen on limited exam Musculoskeletal: grossly normal tone BUE/BLE Psychiatric: grossly normal mood and affect, speech fluent and appropriate Neurologic: grossly non-focal.          Labs on Admission:  Basic Metabolic Panel:  Recent Labs Lab 03/08/13 1640  NA 139  K 4.2  CL 103  CO2 23  GLUCOSE 161*  BUN 20  CREATININE 1.26  CALCIUM 9.1   Liver Function Tests:  Recent Labs Lab 03/08/13 1640  AST 28  ALT 41  ALKPHOS 62  BILITOT 0.2*  PROT 6.8  ALBUMIN 3.8    Recent Labs Lab 03/08/13 1640  LIPASE 22   No results found for this basename: AMMONIA,  in the last 168 hours CBC:  Recent Labs Lab 03/08/13 1640  WBC 7.7  NEUTROABS 5.0  HGB 13.7  HCT 40.4  MCV 89.4  PLT 282   Cardiac Enzymes: No results found for this basename: CKTOTAL, CKMB, CKMBINDEX, TROPONINI,  in the last 168 hours  BNP (last 3 results) No results found for this basename: PROBNP,  in the last 8760 hours CBG: No results found for this basename: GLUCAP,  in the last 168 hours  Radiological Exams on Admission: Dg Chest 2 View  03/08/2013   CLINICAL DATA:  Pain  between the shoulders with numbness in the left lung, history of hypertension and coronary artery disease  EXAM: CHEST  2 VIEW  COMPARISON:  DG CHEST 2 VIEW dated 10/23/2012  FINDINGS: The lungs are adequately inflated. There is no focal infiltrate. There is no pleural effusion or pneumothorax. The cardiac silhouette is normal in size. The pulmonary vascularity is not engorged. The mediastinum is normal in width. There is no pleural effusion. The observed portions of the bony thorax appear  normal.  IMPRESSION: There is no evidence of pneumonia nor CHF nor other acute cardiopulmonary disease.   Electronically Signed   By: David  Martinique   On: 03/08/2013 16:27    EKG: Independently reviewed. Sinus rhythm, no ischemic changes  Assessment/Plan Active Problems:   Atypical chest pain   CORONARY ARTERY DISEASE   HYPERLIPIDEMIA   HYPERTENSION   GERD with stricture   1. Atypical chest pain. Patient presenting with chest pain having atypical features. Initial lab work in the emergency room unremarkable as EKG did not reveal ischemic changes and troponin was within normal limits. He does have an established history of coronary artery disease undergoing percutaneous intervention in 2005. Will place him in 24 hour observation, cycle troponins, continue aspirin, statin, ACE inhibitor, and as needed nitroglycerin. Consider noninvasive workup.  2. Hypertension. Blood pressures on presentation stable, will continue lisinopril at 10 mg by mouth daily 3. Dyslipidemia. Last LDL in December 2014 elevated 155 with total cholesterol of 231. Continue statin with Lipitor 40 mg by mouth daily 4. Dyslipidemia. PPI therapy with Protonix 5. DVT prophylaxis. Lovenox   Code Status: Full code Family Communication: I spoke with patient's brother present at bedside Disposition Plan: Plan to place patient in overnight observation, I don't anticipate him requiring greater than 2 night hospitalization  Time spent: 55  minutes  Kelvin Cellar [facility] Pager [phone]

## 2013-03-08 NOTE — ED Notes (Signed)
Per EDP Ward given nitro for any back pain/discomfort.

## 2013-03-08 NOTE — ED Notes (Addendum)
Pt denies pain/discomfort in back. EDP aware of pt response to nitro.

## 2013-03-08 NOTE — ED Notes (Signed)
Labs drawn by S Clemmons at 1640.

## 2013-03-09 DIAGNOSIS — IMO0001 Reserved for inherently not codable concepts without codable children: Secondary | ICD-10-CM

## 2013-03-09 LAB — GLUCOSE, CAPILLARY: GLUCOSE-CAPILLARY: 96 mg/dL (ref 70–99)

## 2013-03-09 LAB — TROPONIN I: Troponin I: <0.3 ng/mL

## 2013-03-09 NOTE — Discharge Summary (Signed)
Physician Discharge Summary  ABAS LEICHT HUT:[id number] DOB: Aug 08, 1955 DOA: 03/08/2013  PCP: Alesia Richards, MD  Admit date: 03/08/2013 Discharge date: 03/09/2013  Recommendations for Outpatient Follow-up:  1. Follow up with PCP in 1 week for reevaluation and with cardiology as needed if symptoms persist.    Discharge Diagnoses:  Principal Problem:   Musculoskeletal pain Active Problems:   HYPERLIPIDEMIA   HYPERTENSION   CORONARY ARTERY DISEASE   GERD with stricture   Atypical chest pain   Discharge Condition: stable, improved  Diet recommendation: healthy heart  Wt Readings from Last 3 Encounters:  03/08/13 94.892 kg (209 lb 3.2 oz)  01/30/13 97.886 kg (215 lb 12.8 oz)  05/22/12 94.348 kg (208 lb)    History of present illness:  [name] is a 58 y.o. male with a past medical history of coronary artery disease, status post percutaneous intervention in 2005, hypertension, dyslipidemia, presented to the emergency department with complaints of left arm discomfort and intrascapular pain. He reports having these symptoms intermittently over the past 3 weeks, typically lasting 20-30 minutes, back pain characterized as sharp and stabbing, left arm discomfort characterized as mild and achy. Overnight pain symptoms worsening. Physical exertion does not precipitate symptoms, as it occurs usually while at rest. He denies associated nausea, vomiting, diaphoresis, palpitations, dizziness, lightheadedness, syncope or presyncope. He does state that he presented in a similar fashion packing 2005 when he underwent cardiac catheterization with PCI. EKG did not reveal ischemic changes as a troponin was within normal limits. Patient is chest pain-free during my encounter.   Hospital Course:   Atypical back pain, sounds musculoskeletal as it gets better with repositioning.  This pain is not associated with the left arm discomfort he had yesterday.  Continue mobic as prescribed by  PCP.  Left arm pain, achy and tingly.  Denied associated numbness, weakness of the left arm, leg, face.  Denied facial droop, confusion, slurred speech.  He did not have typical chest pressure associated with this pain and telemetry demonstrated NSR, ECG remained stable and nonischemic, and troponins were negative.  Should this pain continue to recur, recommend that he follow up with his cardiologist for possible stress test.    HTN, blood pressures low normal.  Continue lisinopril.  HLD, LDL in December 2014 elevated 155 with total cholesterol of 231. Continue statin with Lipitor 40 mg by mouth daily.  GERD, stable, continue PPI.  Procedures:  CXR  Consultations:  None  Discharge Exam: Filed Vitals:   03/09/13 0646  BP: 104/59  Pulse: 71  Temp: 98 F (36.7 C)  Resp: 20   Filed Vitals:   03/08/13 2013 03/08/13 2030 03/09/13 0230 03/09/13 0646  BP:  124/79 93/67 104/59  Pulse:  80 66 71  Temp:  98.3 F (36.8 C) 98.1 F (36.7 C) 98 F (36.7 C)  TempSrc:  Oral Oral Oral  Resp:  20 20 20   Height: 5\' 10"  (1.778 m)     Weight: 94.892 kg (209 lb 3.2 oz)     SpO2:  99% 98% 100%    General: CM, NAD HEENT:  MMM, NCAT Cardiovascular: RRR, no mrg, 2+ pulses, warm extremities Respiratory: CTAB ABD:  NABS, soft, NT/ND MSK:  Normal tone and bulk, no LEE.  Back with no pain with palpation of the spinous processes or paraspinous muscles in the area which has been intermittently painful for him.   Neuro:  Strength 5/5 BUE, sensation intake to light touch, no facial droop.  Discharge Instructions      Discharge Orders   Future Appointments Provider Department Dept Phone   04/01/2013 10:00 AM Fay Records, MD [facility] [phone]   04/30/2013 10:30 AM Vicie Mutters, PA-C [facility] [phone]   07/24/2013 10:00 AM Unk Pinto, MD [facility] [phone]   Future Orders Complete  By Expires   Call MD for:  difficulty breathing, headache or visual disturbances  As directed    Call MD for:  extreme fatigue  As directed    Call MD for:  hives  As directed    Call MD for:  persistant dizziness or light-headedness  As directed    Call MD for:  persistant nausea and vomiting  As directed    Call MD for:  severe uncontrolled pain  As directed    Call MD for:  temperature >100.4  As directed    Diet - low sodium heart healthy  As directed    Discharge instructions  As directed    Comments:     You were hospitalized with intermittent back pain and left arm pain.  Your ECG, telemetry, and blood tests for heart attack have been okay.  You most likely have musculoskeletal pain causing these symptoms which mobic should help.  Please follow up with your primary care doctor if your symptoms persist despite treatment and consider returning to cardiology for a stress test.   Increase activity slowly  As directed        Medication List         aspirin EC 81 MG tablet  Take 243 mg by mouth daily.     atorvastatin 80 MG tablet  Commonly known as:  LIPITOR  Take 40 mg by mouth daily.     esomeprazole 20 MG capsule  Commonly known as:  NEXIUM  Take 20 mg by mouth daily at 12 noon. Takes OTC 20 mg     lisinopril 10 MG tablet  Commonly known as:  PRINIVIL,ZESTRIL  Take one tablet by mouth one time daily. Onlt take when blood pressure is high.     meloxicam 15 MG tablet  Commonly known as:  MOBIC  1/2 to 1 tablet daily with food (after supper) for neck pain and inflammation     Vitamin D-3 5000 UNITS Tabs  Take 1 tablet by mouth daily.       Follow-up Information   Follow up with MCKEOWN,WILLIAM DAVID, MD. Schedule an appointment as soon as possible for a visit in 1 week.   Specialty:  Internal Medicine   Contact information:   [address] [place] [address] [phone]        The results of significant diagnostics from this  hospitalization (including imaging, microbiology, ancillary and laboratory) are listed below for reference.    Significant Diagnostic Studies: Dg Chest 2 View  03/08/2013   CLINICAL DATA:  Pain between the shoulders with numbness in the left lung, history of hypertension and coronary artery disease  EXAM: CHEST  2 VIEW  COMPARISON:  DG CHEST 2 VIEW dated 10/23/2012  FINDINGS: The lungs are adequately inflated. There is no focal infiltrate. There is no pleural effusion or pneumothorax. The cardiac silhouette is normal in size. The pulmonary vascularity is not engorged. The mediastinum is normal in width. There is no pleural effusion. The observed portions of the bony thorax appear normal.  IMPRESSION: There is no evidence of pneumonia nor CHF nor other  acute cardiopulmonary disease.   Electronically Signed   By: David  Martinique   On: 03/08/2013 16:27    Microbiology: No results found for this or any previous visit (from the past 240 hour(s)).   Labs: Basic Metabolic Panel:  Recent Labs Lab 03/08/13 1640  NA 139  K 4.2  CL 103  CO2 23  GLUCOSE 161*  BUN 20  CREATININE 1.26  CALCIUM 9.1   Liver Function Tests:  Recent Labs Lab 03/08/13 1640  AST 28  ALT 41  ALKPHOS 62  BILITOT 0.2*  PROT 6.8  ALBUMIN 3.8    Recent Labs Lab 03/08/13 1640  LIPASE 22   No results found for this basename: AMMONIA,  in the last 168 hours CBC:  Recent Labs Lab 03/08/13 1640  WBC 7.7  NEUTROABS 5.0  HGB 13.7  HCT 40.4  MCV 89.4  PLT 282   Cardiac Enzymes:  Recent Labs Lab 03/08/13 2050 03/09/13 0240  TROPONINI <0.30 <0.30   BNP: BNP (last 3 results) No results found for this basename: PROBNP,  in the last 8760 hours CBG:  Recent Labs Lab 03/09/13 [place] 96    Time coordinating discharge: 45 minutes  Signed:  Deondrea Aguado  [facility] 03/09/2013, 8:11 AM

## 2013-03-09 NOTE — Progress Notes (Signed)
UR Completed. Llana Aliment, RN, BSN Nurse Case Manager

## 2013-04-01 ENCOUNTER — Encounter: Payer: Self-pay | Admitting: Internal Medicine

## 2013-04-01 ENCOUNTER — Ambulatory Visit (INDEPENDENT_AMBULATORY_CARE_PROVIDER_SITE_OTHER): Payer: 59 | Admitting: Internal Medicine

## 2013-04-01 VITALS — BP 136/83 | HR 79 | Ht 70.0 in | Wt 215.0 lb

## 2013-04-01 DIAGNOSIS — I2510 Atherosclerotic heart disease of native coronary artery without angina pectoris: Secondary | ICD-10-CM

## 2013-04-01 DIAGNOSIS — E785 Hyperlipidemia, unspecified: Secondary | ICD-10-CM

## 2013-04-01 MED ORDER — NITROGLYCERIN 0.4 MG SL SUBL: 0.4000 mg | SUBLINGUAL_TABLET | SUBLINGUAL | Status: DC | PRN

## 2013-04-01 NOTE — Patient Instructions (Signed)
Your physician recommends that you continue on your current medications as directed. Please refer to the Current Medication list given to you today.  An Rx for Nitro has been sent to your pharmacy   Your physician has requested that you have en exercise stress myoview. For further information please visit [email or web address]. Please follow instruction sheet, as given.  Your physician recommends that you return for a FASTING lipid profile: to be done the same day as nuclear stress test  Your physician wants you to follow-up in: 6 months You will receive a reminder letter in the mail two months in advance. If you don't receive a letter, please call our office to schedule the follow-up appointment.

## 2013-04-01 NOTE — Progress Notes (Signed)
HPI Patient is Francis 58 yo who has Francis history of CAD  I saw him for the last time in  [name] is Francis 58 year old Ibarra who I followed in cardiology  clinic. He has Francis history of coronary artery disease. He was last seen  in clinic back in 2011 H has Francis history of CAD in 2005.  S/P acute coronary syndrome  Cath showed:  LM normal; LAD large septal perforator with 90% prox stenosis; LCx patent  20 to 30% in om;  RCA with subtotal occlusion; distal vessel fills via collaterals.  Patient underwnt PCI with DES to  RCA  LVEF w 50 to 99%  Procedure complicated by DVT Has has been admitted since for CP  (last in 2011).  Stress test in 2011 showed inferior infarct with triv periinfarct ischeia.  LVEF 61%   Patient was seen recently in ER  He has been having symptoms of back pain between shoulder blades  Occur at night when in bed  NO SOB or diaphoresis.  Not associated with activity.  Radiated some to L shoulder.  Also complaining of intermitt L arm numbness at other times, not associated with acitvity.  Went to ER  Enzymes neg.  Felt to be noncardiac but f/u in cardiology recomm   The patient has continued to have spells.   Allergies  Allergen Reactions  . Penicillins     unspecified    Current Outpatient Prescriptions  Medication Sig Dispense Refill  . aspirin EC 81 MG tablet 2 tabs po qd      . atorvastatin (LIPITOR) 80 MG tablet Take 40 mg by mouth daily.      . Cholecalciferol (VITAMIN D-3) 5000 UNITS TABS Take 1 tablet by mouth daily.      Marland Kitchen esomeprazole (NEXIUM) 20 MG capsule Take 20 mg by mouth daily at 12 noon. Takes OTC 20 mg      . lisinopril (PRINIVIL,ZESTRIL) 10 MG tablet Take one tablet by mouth one time daily. Onlt take when blood pressure is high.  90 tablet  1  . nitroGLYCERIN (NITROSTAT) 0.4 MG SL tablet Place 1 tablet (0.4 mg total) under the tongue every 5 (five) minutes as needed for chest pain.  25 tablet  3   No current facility-administered medications for this visit.    Past  Medical History  Diagnosis Date  . ANXIETY STATE NEC 11/01/2006  . ANXIETY 12/25/2007  . CORONARY ARTERY DISEASE 08/02/2006    off Plavix x 3 years  . DIVERTICULITIS OF COLON 01/06/2010  . DVT, HX OF 08/02/2006  . 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 HYPERLIPIDEMIA 08/02/2006  . HYPERTENSION 08/05/2008  . Memory loss 07/21/2009  . Pilonidal cyst     low back pain    Past Surgical History  Procedure Laterality Date  . Coronary angioplasty with stent placement    . Pilonidal cyst excision      teenager  . Esophagogastroduodenoscopy  06/16/2010    antral ulcers, esophageal stricture - dilated, duodenitis, hiatal hernia  . Colonoscopy w/ polypectomy  06/16/2010    diminutive adenoma, diverticulosis, hemorrhoids    Family History  Problem Relation Age of Onset  . Colon cancer Father     deceased age 69  . Dementia Mother     History   Social History  . Marital Status: Married    Spouse Name: N/Francis    Number of Children: 2  . Years of Education: N/Francis   Occupational History  . Secondary school teacher  Social History Main Topics  . Smoking status: Never Smoker   . Smokeless tobacco: Never Used  . Alcohol Use: Yes     Comment: approx 2 drinks per week  . Drug Use: Yes     Comment: marjuana as per 2011 Echart H&P denies any at this time (06/2010  . Sexual Activity: Not on file   Other Topics Concern  . Not on file   Social History Narrative   Daily caffeine    Review of Systems:  All systems reviewed.  They are negative to the above problem except as previously stated.  Vital Signs: BP 136/83  Pulse 79  Ht 5\' 10"  (1.778 m)  Wt 215 lb (97.523 kg)  BMI 30.85 kg/m2  Physical Exam  HEENT:  Normocephalic, atraumatic. EOMI, PERRLA.  Neck: JVP is normal.  No bruits.  Lungs: clear to auscultation. No rales no wheezes.  Heart: Regular rate and rhythm. Normal S1, S2. No S3.   No significant murmurs. PMI not displaced. Back  Minimal tenderness to palpitation  No change with arm  movement.  Abdomen:  Supple, nontender. Normal bowel sounds. No masses. No hepatomegaly.  Extremities:   Good distal pulses throughout. No lower extremity edema.  Musculoskeletal :moving all extremities.  Neuro:   alert and oriented x3.  CN II-XII grossly intact.  EKG  03/08/13:  SR 87   Assessment and Plan:  1.  Back pain  Unusual  I am not convinced cardiac.  Prob musculoskel since only occurs at night, though I cannot really elicit with palpitation  I would recomm Francis stress myoview.  Keep on same regimen.  Card for Francis Hecker given if neg.  2.  CAD  As above.  3.  HL  Will need to get lipids followed now that back on lipitor.  F/U in 6 mon unless stress test is abnormal.

## 2013-04-17 ENCOUNTER — Encounter: Payer: Self-pay | Admitting: Cardiovascular Disease

## 2013-04-17 ENCOUNTER — Other Ambulatory Visit (INDEPENDENT_AMBULATORY_CARE_PROVIDER_SITE_OTHER): Payer: 59

## 2013-04-17 ENCOUNTER — Ambulatory Visit (HOSPITAL_COMMUNITY): Payer: 59 | Attending: Cardiovascular Disease | Admitting: Radiology

## 2013-04-17 VITALS — BP 123/79 | Ht 70.0 in | Wt 207.0 lb

## 2013-04-17 DIAGNOSIS — E785 Hyperlipidemia, unspecified: Secondary | ICD-10-CM

## 2013-04-17 DIAGNOSIS — R079 Chest pain, unspecified: Secondary | ICD-10-CM

## 2013-04-17 DIAGNOSIS — I491 Atrial premature depolarization: Secondary | ICD-10-CM

## 2013-04-17 DIAGNOSIS — I2510 Atherosclerotic heart disease of native coronary artery without angina pectoris: Secondary | ICD-10-CM | POA: Insufficient documentation

## 2013-04-17 LAB — ALT: ALT: 25 U/L (ref 0–53)

## 2013-04-17 LAB — LIPID PANEL
Cholesterol: 148 mg/dL (ref 0–200)
HDL: 46.4 mg/dL (ref >39.00)
LDL Cholesterol: 80 mg/dL (ref 0–99)
Total CHOL/HDL Ratio: 3
Triglycerides: 110 mg/dL (ref 0.0–149.0)
VLDL: 22 mg/dL (ref 0.0–40.0)

## 2013-04-17 MED ORDER — TECHNETIUM TC 99M SESTAMIBI GENERIC - CARDIOLITE
11.0000 | Freq: Once | INTRAVENOUS | Status: AC | PRN
Start: 2013-04-17 — End: 2013-04-17
  Administered 2013-04-17: 11 via INTRAVENOUS

## 2013-04-17 MED ORDER — TECHNETIUM TC 99M SESTAMIBI GENERIC - CARDIOLITE
33.0000 | Freq: Once | INTRAVENOUS | Status: AC | PRN
  Administered 2013-04-17: 33 via INTRAVENOUS

## 2013-04-17 NOTE — Progress Notes (Signed)
[facility] [address] [place], [place] [address] [phone]    Cardiology Nuclear Med Study  [name] is a 58 y.o. male     MRN : [id number]     DOB: 09-02-1955  Procedure Date: 04/17/2013  Nuclear Med Background Indication for Stress Test:  Evaluation for Ischemia, Stent Patency and Post Hospital  History:  '11 MPI: EF: 61% inferior infarct with trivial peri-infarct ischemia, CAD, Cath-Stent-RCA, DVT Cardiac Risk Factors: Hypertension and Lipids  Symptoms:  Chest Pain and Palpitations   Nuclear Pre-Procedure Caffeine/Decaff Intake:  None NPO After: 7:00pm   Lungs:  clear O2 Sat: 99% on room air. IV 0.9% NS with Angio Cath:  22g  IV Site: L Hand  IV Started by:  Crissie Figures, RN  Chest Size (in):  46 Cup Size: n/a  Height: 5\' 10"  (1.778 m)  Weight:  207 lb (93.895 kg)  BMI:  Body mass index is 29.7 kg/(m^2). Tech Comments:  N/A    Nuclear Med Study 1 or 2 day study: 1 day  Stress Test Type:  Stress  Reading MD: N/A  Order Authorizing Provider:  Dorris Carnes, MD  Resting Radionuclide: Technetium 30m Sestamibi  Resting Radionuclide Dose: 11.0 mCi   Stress Radionuclide:  Technetium 24m Sestamibi  Stress Radionuclide Dose: 33.0 mCi           Stress Protocol Rest HR: 68 Stress HR: 153  Rest BP: 123/79 Stress BP: 180/75  Exercise Time (min): 8:45 METS: 10.10   Predicted Max HR: 161 bpm % Max HR: 95.03 bpm Rate Pressure Product: 27540   Dose of Adenosine (mg):  n/a Dose of Lexiscan: n/a mg  Dose of Atropine (mg): n/a Dose of Dobutamine: n/a mcg/kg/min (at max HR)  Stress Test Technologist: Perrin Maltese, EMT-P  Nuclear Technologist:  Charlton Amor, CNMT     Rest Procedure:  Myocardial perfusion imaging was performed at rest 45 minutes following the intravenous administration of Technetium 109m Sestamibi. Rest ECG: NSR - Normal EKG  Stress Procedure:  The patient exercised on the treadmill utilizing the Bruce Protocol for  8:45 minutes. The patient stopped due to fatigue and denied any chest pain.  Technetium 74m Sestamibi was injected at peak exercise and myocardial perfusion imaging was performed after a brief delay. Stress ECG: No significant change from baseline ECG  QPS Raw Data Images:  Mild diaphragmatic attenuation.  Normal left ventricular size. Stress Images:  Normal homogeneous uptake in all areas of the myocardium. Rest Images:  Normal homogeneous uptake in all areas of the myocardium. Subtraction (SDS):  No evidence of ischemia. Transient Ischemic Dilatation (Normal <1.22):  0.78 Lung/Heart Ratio (Normal <0.45):  0.35  Quantitative Gated Spect Images QGS EDV:  72 ml QGS ESV:  22 ml  Impression Exercise Capacity:  Good exercise capacity. BP Response:  Normal blood pressure response. Clinical Symptoms:  No significant symptoms noted. ECG Impression:  No significant ST segment change suggestive of ischemia. Comparison with Prior Nuclear Study: No previous nuclear study performed  Overall Impression:  Low risk stress nuclear study with mild diaphragmatic attenuation artifact.  LV Ejection Fraction: 70%.  LV Wall Motion:  NL LV Function; NL Wall Motion

## 2013-04-30 ENCOUNTER — Ambulatory Visit: Payer: 59 | Admitting: Physician Assistant

## 2013-04-30 ENCOUNTER — Encounter: Payer: Self-pay | Admitting: Physician Assistant

## 2013-04-30 VITALS — BP 122/80 | HR 76 | Temp 98.1°F | Resp 16 | Ht 70.0 in | Wt 212.0 lb

## 2013-04-30 DIAGNOSIS — I10 Essential (primary) hypertension: Secondary | ICD-10-CM

## 2013-04-30 DIAGNOSIS — R7303 Prediabetes: Secondary | ICD-10-CM

## 2013-04-30 DIAGNOSIS — E559 Vitamin D deficiency, unspecified: Secondary | ICD-10-CM

## 2013-04-30 DIAGNOSIS — Z79899 Other long term (current) drug therapy: Secondary | ICD-10-CM

## 2013-04-30 DIAGNOSIS — E785 Hyperlipidemia, unspecified: Secondary | ICD-10-CM

## 2013-04-30 LAB — BASIC METABOLIC PANEL WITH GFR
BUN: 20 mg/dL (ref 6–23)
CHLORIDE: 104 meq/L (ref 96–112)
CO2: 26 mEq/L (ref 19–32)
Calcium: 9.6 mg/dL (ref 8.4–10.5)
Creat: 1.24 mg/dL (ref 0.50–1.35)
GFR, EST NON AFRICAN AMERICAN: 63 mL/min
GFR, Est African American: 73 mL/min
Glucose, Bld: 88 mg/dL (ref 70–99)
POTASSIUM: 4.7 meq/L (ref 3.5–5.3)
Sodium: 139 mEq/L (ref 135–145)

## 2013-04-30 LAB — CBC WITH DIFFERENTIAL/PLATELET
Basophils Absolute: 0.1 10*3/uL (ref 0.0–0.1)
Basophils Relative: 1 % (ref 0–1)
EOS PCT: 2 % (ref 0–5)
Eosinophils Absolute: 0.1 10*3/uL (ref 0.0–0.7)
HCT: 42.3 % (ref 39.0–52.0)
Hemoglobin: 14.8 g/dL (ref 13.0–17.0)
LYMPHS PCT: 23 % (ref 12–46)
Lymphs Abs: 1.5 10*3/uL (ref 0.7–4.0)
MCH: 30.4 pg (ref 26.0–34.0)
MCHC: 35 g/dL (ref 30.0–36.0)
MCV: 86.9 fL (ref 78.0–100.0)
MONO ABS: 0.5 10*3/uL (ref 0.1–1.0)
Monocytes Relative: 8 % (ref 3–12)
Neutro Abs: 4.3 10*3/uL (ref 1.7–7.7)
Neutrophils Relative %: 66 % (ref 43–77)
Platelets: 315 10*3/uL (ref 150–400)
RBC: 4.87 MIL/uL (ref 4.22–5.81)
RDW: 13.7 % (ref 11.5–15.5)
WBC: 6.5 10*3/uL (ref 4.0–10.5)

## 2013-04-30 LAB — HEMOGLOBIN A1C
Hgb A1c MFr Bld: 6.3 % — ABNORMAL HIGH (ref <5.7)
MEAN PLASMA GLUCOSE: 134 mg/dL — AB (ref <117)

## 2013-04-30 LAB — TSH: TSH: 3.614 u[IU]/mL (ref 0.350–4.500)

## 2013-04-30 LAB — MAGNESIUM: Magnesium: 2 mg/dL (ref 1.5–2.5)

## 2013-04-30 MED ORDER — TRAMADOL HCL 50 MG PO TABS: 50.0000 mg | ORAL_TABLET | Freq: Four times a day (QID) | ORAL | Status: DC | PRN

## 2013-04-30 NOTE — Progress Notes (Signed)
HPI 58 y.o. male  presents for 3 month follow up with hypertension, hyperlipidemia, prediabetes and vitamin D. His blood pressure has been controlled at home, today their BP is BP: 122/80 mmHg He does workout, started walking the park with nice weather. He denies chest pain, shortness of breath, dizziness.  He is on cholesterol medication and denies myalgias. His cholesterol is at goal. The cholesterol last visit was:   Lab Results  Component Value Date   CHOL 148 04/17/2013   HDL 46.40 04/17/2013   LDLCALC 80 04/17/2013   LDLDIRECT 190.6 07/30/2010   TRIG 110.0 04/17/2013   CHOLHDL 3 04/17/2013   He has been working on diet and exercise for prediabetes, and denies paresthesia of the feet, polydipsia, polyuria and visual disturbances. Last A1C in the office was:  Lab Results  Component Value Date   HGBA1C 6.1* 01/30/2013   Patient is on Vitamin D supplement.   Left shoulder/back pain in the middle of the night, had normal stress test last week. Mobic is not helping. Takes nexium, denies GERD. Denies numbness, tingling, but states he does have some sensation in left medial arm.   Current Medications:  Current Outpatient Prescriptions on File Prior to Visit  Medication Sig Dispense Refill  . aspirin EC 81 MG tablet 2 tabs po qd      . atorvastatin (LIPITOR) 80 MG tablet Take 40 mg by mouth daily.      . Cholecalciferol (VITAMIN D-3) 5000 UNITS TABS Take 1 tablet by mouth daily.      Marland Kitchen esomeprazole (NEXIUM) 20 MG capsule Take 20 mg by mouth daily at 12 noon. Takes OTC 20 mg      . lisinopril (PRINIVIL,ZESTRIL) 10 MG tablet Take one tablet by mouth one time daily. Onlt take when blood pressure is high.  90 tablet  1  . nitroGLYCERIN (NITROSTAT) 0.4 MG SL tablet Place 1 tablet (0.4 mg total) under the tongue every 5 (five) minutes as needed for chest pain.  25 tablet  3   No current facility-administered medications on file prior to visit.   Medical History:  Past Medical History  Diagnosis  Date  . ANXIETY STATE NEC 11/01/2006  . ANXIETY 12/25/2007  . CORONARY ARTERY DISEASE 08/02/2006    off Plavix x 3 years  . DIVERTICULITIS OF COLON 01/06/2010  . DVT, HX OF 08/02/2006  . 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 HYPERLIPIDEMIA 08/02/2006  . HYPERTENSION 08/05/2008  . Memory loss 07/21/2009  . Pilonidal cyst     low back pain   Allergies:  Allergies  Allergen Reactions  . Penicillins     unspecified     Review of Systems: [X]  = complains of  [ ]  = denies  General: Fatigue Valu.Nieves ] Fever [ ]  Chills [ ]  Weakness [ ]   Insomnia Valu.Nieves ] Eyes: Redness [ ]  Blurred vision [ ]  Diplopia [ ]   ENT: Congestion [ ]  Sinus Pain [ ]  Post Nasal Drip [ ]  Sore Throat [ ]  Earache [ ]   Cardiac: Chest pain/pressure [ ]  SOB [ ]  Orthopnea [ ]   Palpitations [ ]   Paroxysmal nocturnal dyspnea[ ]  Claudication [ ]  Edema [ ]   Pulmonary: Cough [ ]  Wheezing[ ]   SOB [ ]   Snoring Valu.Nieves ]  GI: Nausea [ ]  Vomiting[ ]  Dysphagia[ ]  Heartburn[ ]  Abdominal pain [ ]  Constipation [ ] ; Diarrhea [ ] ; BRBPR [ ]  Melena[ ]  GU: Hematuria[ ]  Dysuria [ ]  Nocturia[ ]  Urgency [ ]   Hesitancy [ ]   Discharge [ ]  Neuro: Headaches[ ]  Vertigo[ ]  Paresthesias[ ]  Spasm [ ]  Speech changes [ ]  Incoordination [ ]   Ortho: Arthritis [ ]  Joint pain [ ]  Muscle pain [ X] Joint swelling [ ]  Back Pain [ ]  Skin:  Rash [ ]   Pruritis [ ]  Change in skin lesion [ ]   Psych: Depression[ ]  Anxiety[ ]  Confusion [ ]  Memory loss [ ]   Heme/Lypmh: Bleeding [ ]  Bruising [ ]  Enlarged lymph nodes [ ]   Endocrine: Visual blurring [ ]  Paresthesia [ ]  Polyuria [ ]  Polydypsea [ ]    Heat/cold intolerance [ ]  Hypoglycemia [ ]   Family history- Review and unchanged Social history- Review and unchanged Physical Exam: BP 122/80  Pulse 76  Temp(Src) 98.1 F (36.7 C)  Resp 16  Ht 5\' 10"  (1.778 m)  Wt 212 lb (96.163 kg)  BMI 30.42 kg/m2 Wt Readings from Last 3 Encounters:  04/30/13 212 lb (96.163 kg)  04/17/13 207 lb (93.895 kg)  04/01/13 215 lb (97.523 kg)   General  Appearance: Well nourished, in no apparent distress. Eyes: PERRLA, EOMs, conjunctiva no swelling or erythema Sinuses: No Frontal/maxillary tenderness ENT/Mouth: Ext aud canals clear, TMs without erythema, bulging. No erythema, swelling, or exudate on post pharynx.  Tonsils not swollen or erythematous. Hearing normal.  Neck: Supple, thyroid normal.  Respiratory: Respiratory effort normal, BS equal bilaterally without rales, rhonchi, wheezing or stridor.  Cardio: RRR with no MRGs. Brisk peripheral pulses without edema.  Abdomen: Soft, + BS.  Non tender, no guarding, rebound, hernias, masses. Lymphatics: Non tender without lymphadenopathy.  Musculoskeletal: Full ROM, 5/5 strength, normal gait. Decreased reflexes on left arm.  Skin: Warm, dry without rashes, lesions, ecchymosis.  Neuro: Cranial nerves intact. Normal muscle tone, no cerebellar symptoms. Sensation intact.  Psych: Awake and oriented X 3, normal affect, Insight and Judgment appropriate.   Assessment and Plan:  Hypertension: Continue medication, monitor blood pressure at home. Continue DASH diet. Cholesterol: Continue diet and exercise. Check cholesterol.  Pre-diabetes-Continue diet and exercise. Check A1C Vitamin D Def- check level and continue medications.  Sleep apnea- snoring, fatigue, crowded mouth, very likely- suggest getting sleep study Left shoulder pain at night- decreased reflexes left arm-? If from neck or sleep apena- normal stress test- get cervical neck pillow and if not better get xray  Continue diet and meds as discussed. Further disposition pending results of labs. OVER 40 minutes of exam, counseling, chart review, referral performed   Vicie Mutters 10:46 AM

## 2013-04-30 NOTE — Patient Instructions (Addendum)
Cervical Strain and Sprain (Whiplash) with Rehab Cervical strain and sprains are injuries that commonly occur with "whiplash" injuries. Whiplash occurs when the neck is forcefully whipped backward or forward, such as during a motor vehicle accident. The muscles, ligaments, tendons, discs and nerves of the neck are susceptible to injury when this occurs. SYMPTOMS   Pain or stiffness in the front and/or back of neck  Symptoms may present immediately or up to 24 hours after injury.  Dizziness, headache, nausea and vomiting.  Muscle spasm with soreness and stiffness in the neck.  Tenderness and swelling at the injury site. CAUSES  Whiplash injuries often occur during contact sports or motor vehicle accidents.  RISK INCREASES WITH:  Osteoarthritis of the spine.  Situations that make head or neck accidents or trauma more likely.  High-risk sports (football, rugby, wrestling, hockey, auto racing, gymnastics, diving, contact karate or boxing).  Poor strength and flexibility of the neck.  Previous neck injury.  Poor tackling technique.  Improperly fitted or padded equipment. PREVENTION  Learn and use proper technique (avoid tackling with the head, spearing and head-butting; use proper falling techniques to avoid landing on the head).  Warm up and stretch properly before activity.  Maintain physical fitness:  Strength, flexibility and endurance.  Cardiovascular fitness.  Wear properly fitted and padded protective equipment, such as padded soft collars, for participation in contact sports. PROGNOSIS  Recovery for cervical strain and sprain injuries is dependent on the extent of the injury. These injuries are usually curable in 1 week to 3 months with appropriate treatment.  RELATED COMPLICATIONS   Temporary numbness and weakness may occur if the nerve roots are damaged, and this may persist until the nerve has completely healed.  Chronic pain due to frequent recurrence of  symptoms.  Prolonged healing, especially if activity is resumed too soon (before complete recovery). TREATMENT  Treatment initially involves the use of ice and medication to help reduce pain and inflammation. It is also important to perform strengthening and stretching exercises and modify activities that worsen symptoms so the injury does not get worse. These exercises may be performed at home or with a therapist. For patients who experience severe symptoms, a soft padded collar may be recommended to be worn around the neck.  Improving your posture may help reduce symptoms. Posture improvement includes pulling your chin and abdomen in while sitting or standing. If you are sitting, sit in a firm chair with your buttocks against the back of the chair. While sleeping, try replacing your pillow with a small towel rolled to 2 inches in diameter, or use a cervical pillow or soft cervical collar. Poor sleeping positions delay healing.  For patients with nerve root damage, which causes numbness or weakness, the use of a cervical traction apparatus may be recommended. Surgery is rarely necessary for these injuries. However, cervical strain and sprains that are present at birth (congenital) may require surgery. MEDICATION   If pain medication is necessary, nonsteroidal anti-inflammatory medications, such as aspirin and ibuprofen, or other minor pain relievers, such as acetaminophen, are often recommended.  Do not take pain medication for 7 days before surgery.  Prescription pain relievers may be given if deemed necessary by your caregiver. Use only as directed and only as much as you need. HEAT AND COLD:   Cold treatment (icing) relieves pain and reduces inflammation. Cold treatment should be applied for 10 to 15 minutes every 2 to 3 hours for inflammation and pain and immediately after any activity that  need.  HEAT AND COLD:   · Cold treatment (icing) relieves pain and reduces inflammation. Cold treatment should be applied for 10 to 15 minutes every 2 to 3 hours for inflammation and pain and immediately after any activity that aggravates your symptoms. Use ice packs or an ice massage.  · Heat treatment may be used prior to  performing the stretching and strengthening activities prescribed by your caregiver, physical therapist, or athletic trainer. Use a heat pack or a warm soak.  SEEK MEDICAL CARE IF:   · Symptoms get worse or do not improve in 2 weeks despite treatment.  · New, unexplained symptoms develop (drugs used in treatment may produce side effects).  EXERCISES  RANGE OF MOTION (ROM) AND STRETCHING EXERCISES - Cervical Strain and Sprain  These exercises may help you when beginning to rehabilitate your injury. In order to successfully resolve your symptoms, you must improve your posture. These exercises are designed to help reduce the forward-head and rounded-shoulder posture which contributes to this condition. Your symptoms may resolve with or without further involvement from your physician, physical therapist or athletic trainer. While completing these exercises, remember:   · Restoring tissue flexibility helps normal motion to return to the joints. This allows healthier, less painful movement and activity.  · An effective stretch should be held for at least 20 seconds, although you may need to begin with shorter hold times for comfort.  · A stretch should never be painful. You should only feel a gentle lengthening or release in the stretched tissue.  STRETCH- Axial Extensors  · Lie on your back on the floor. You may bend your knees for comfort. Place a rolled up hand towel or dish towel, about 2 inches in diameter, under the part of your head that makes contact with the floor.  · Gently tuck your chin, as if trying to make a "double chin," until you feel a gentle stretch at the base of your head.  · Hold __________ seconds.  Repeat __________ times. Complete this exercise __________ times per day.   STRETECH - Axial Extension   · Stand or sit on a firm surface. Assume a good posture: chest up, shoulders drawn back, abdominal muscles slightly tense, knees unlocked (if standing) and feet hip width apart.  · Slowly retract your  chin so your head slides back and your chin slightly lowers.Continue to look straight ahead.  · You should feel a gentle stretch in the back of your head. Be certain not to feel an aggressive stretch since this can cause headaches later.  · Hold for __________ seconds.  Repeat __________ times. Complete this exercise __________ times per day.  STRETCH  Cervical Side Bend   · Stand or sit on a firm surface. Assume a good posture: chest up, shoulders drawn back, abdominal muscles slightly tense, knees unlocked (if standing) and feet hip width apart.  · Without letting your nose or shoulders move, slowly tip your right / left ear to your shoulder until your feel a gentle stretch in the muscles on the opposite side of your neck.  · Hold __________ seconds.  Repeat __________ times. Complete this exercise __________ times per day.  STRETCH  Cervical Rotators   · Stand or sit on a firm surface. Assume a good posture: chest up, shoulders drawn back, abdominal muscles slightly tense, knees unlocked (if standing) and feet hip width apart.  · Keeping your eyes level with the ground, slowly turn your head until you feel a gentle   stretch along the back and opposite side of your neck.  · Hold __________ seconds.  Repeat __________ times. Complete this exercise __________ times per day.  RANGE OF MOTION - Neck Circles   · Stand or sit on a firm surface. Assume a good posture: chest up, shoulders drawn back, abdominal muscles slightly tense, knees unlocked (if standing) and feet hip width apart.  · Gently roll your head down and around from the back of one shoulder to the back of the other. The motion should never be forced or painful.  · Repeat the motion 10-20 times, or until you feel the neck muscles relax and loosen.  Repeat __________ times. Complete the exercise __________ times per day.  STRENGTHENING EXERCISES - Cervical Strain and Sprain  These exercises may help you when beginning to rehabilitate your injury. They may  resolve your symptoms with or without further involvement from your physician, physical therapist or athletic trainer. While completing these exercises, remember:   · Muscles can gain both the endurance and the strength needed for everyday activities through controlled exercises.  · Complete these exercises as instructed by your physician, physical therapist or athletic trainer. Progress the resistance and repetitions only as guided.  · You may experience muscle soreness or fatigue, but the pain or discomfort you are trying to eliminate should never worsen during these exercises. If this pain does worsen, stop and make certain you are following the directions exactly. If the pain is still present after adjustments, discontinue the exercise until you can discuss the trouble with your clinician.  STRENGTH Cervical Flexors, Isometric  · Face a wall, standing about 6 inches away. Place a small pillow, a ball about 6-8 inches in diameter, or a folded towel between your forehead and the wall.  · Slightly tuck your chin and gently push your forehead into the soft object. Push only with mild to moderate intensity, building up tension gradually. Keep your jaw and forehead relaxed.  · Hold 10 to 20 seconds. Keep your breathing relaxed.  · Release the tension slowly. Relax your neck muscles completely before you start the next repetition.  Repeat __________ times. Complete this exercise __________ times per day.  STRENGTH- Cervical Lateral Flexors, Isometric   · Stand about 6 inches away from a wall. Place a small pillow, a ball about 6-8 inches in diameter, or a folded towel between the side of your head and the wall.  · Slightly tuck your chin and gently tilt your head into the soft object. Push only with mild to moderate intensity, building up tension gradually. Keep your jaw and forehead relaxed.  · Hold 10 to 20 seconds. Keep your breathing relaxed.  · Release the tension slowly. Relax your neck muscles completely before  you start the next repetition.  Repeat __________ times. Complete this exercise __________ times per day.  STRENGTH  Cervical Extensors, Isometric   · Stand about 6 inches away from a wall. Place a small pillow, a ball about 6-8 inches in diameter, or a folded towel between the back of your head and the wall.  · Slightly tuck your chin and gently tilt your head back into the soft object. Push only with mild to moderate intensity, building up tension gradually. Keep your jaw and forehead relaxed.  · Hold 10 to 20 seconds. Keep your breathing relaxed.  · Release the tension slowly. Relax your neck muscles completely before you start the next repetition.  Repeat __________ times. Complete this exercise __________ times per   work. All of your joints have less wear and tear when properly supported by a spine with good posture. This means you will experience a healthier, less painful body.  Correct posture must be practiced with all of your activities, especially prolonged sitting and standing. Correct posture is as important when doing repetitive low-stress activities (typing) as it is when doing a single heavy-load activity (lifting). PROLONGED STANDING WHILE SLIGHTLY LEANING FORWARD When completing a task that requires you to lean forward while  standing in one place for a long time, place either foot up on a stationary 2-4 inch high object to help maintain the best posture. When both feet are on the ground, the low back tends to lose its slight inward curve. If this curve flattens (or becomes too large), then the back and your other joints will experience too much stress, fatigue more quickly and can cause pain.  RESTING POSITIONS Consider which positions are most painful for you when choosing a resting position. If you have pain with flexion-based activities (sitting, bending, stooping, squatting), choose a position that allows you to rest in a less flexed posture. You would want to avoid curling into a fetal position on your side. If your pain worsens with extension-based activities (prolonged standing, working overhead), avoid resting in an extended position such as sleeping on your stomach. Most people will find more comfort when they rest with their spine in a more neutral position, neither too rounded nor too arched. Lying on a non-sagging bed on your side with a pillow between your knees, or on your back with a pillow under your knees will often provide some relief. Keep in mind, being in any one position for a prolonged period of time, no matter how correct your posture, can still lead to stiffness. WALKING Walk with an upright posture. Your ears, shoulders and hips should all line-up. OFFICE WORK When working at a desk, create an environment that supports good, upright posture. Without extra support, muscles fatigue and lead to excessive strain on joints and other tissues. CHAIR:  A chair should be able to slide under your desk when your back makes contact with the back of the chair. This allows you to work closely.  The chair's height should allow your eyes to be level with the upper part of your monitor and your hands to be slightly lower than your elbows.  Body position:  Your feet should make contact with the floor. If this is  not possible, use a foot rest.  Keep your ears over your shoulders. This will reduce stress on your neck and low back. Document Released: 01/24/2005 Document Revised: 05/21/2012 Document Reviewed: 05/08/2008 [facility] Patient Information 2014 [place], [place].  GET NECK PILLOW SUGGEST SLEEP STUDY

## 2013-05-01 LAB — VITAMIN D 25 HYDROXY (VIT D DEFICIENCY, FRACTURES): VIT D 25 HYDROXY: 85 ng/mL (ref 30–89)

## 2013-05-28 ENCOUNTER — Encounter: Payer: Self-pay | Admitting: Internal Medicine

## 2013-06-24 ENCOUNTER — Encounter: Payer: Self-pay | Admitting: Internal Medicine

## 2013-06-24 MED ORDER — SILDENAFIL CITRATE 100 MG PO TABS: 100.0000 mg | ORAL_TABLET | ORAL | Status: DC | PRN

## 2013-07-06 ENCOUNTER — Other Ambulatory Visit: Payer: Self-pay | Admitting: Internal Medicine

## 2013-07-24 ENCOUNTER — Other Ambulatory Visit: Payer: Self-pay | Admitting: *Deleted

## 2013-07-24 ENCOUNTER — Ambulatory Visit (INDEPENDENT_AMBULATORY_CARE_PROVIDER_SITE_OTHER): Payer: 59 | Admitting: Internal Medicine

## 2013-07-24 ENCOUNTER — Encounter: Payer: Self-pay | Admitting: Internal Medicine

## 2013-07-24 VITALS — BP 122/80 | HR 88 | Temp 97.7°F | Resp 16 | Ht 70.5 in | Wt 209.8 lb

## 2013-07-24 DIAGNOSIS — R7303 Prediabetes: Secondary | ICD-10-CM

## 2013-07-24 DIAGNOSIS — Z0000 Encounter for general adult medical examination without abnormal findings: Secondary | ICD-10-CM

## 2013-07-24 DIAGNOSIS — R7401 Elevation of levels of liver transaminase levels: Secondary | ICD-10-CM

## 2013-07-24 DIAGNOSIS — Z125 Encounter for screening for malignant neoplasm of prostate: Secondary | ICD-10-CM

## 2013-07-24 DIAGNOSIS — E559 Vitamin D deficiency, unspecified: Secondary | ICD-10-CM | POA: Insufficient documentation

## 2013-07-24 DIAGNOSIS — R7309 Other abnormal glucose: Secondary | ICD-10-CM | POA: Insufficient documentation

## 2013-07-24 DIAGNOSIS — Z79899 Other long term (current) drug therapy: Secondary | ICD-10-CM | POA: Insufficient documentation

## 2013-07-24 DIAGNOSIS — Z1212 Encounter for screening for malignant neoplasm of rectum: Secondary | ICD-10-CM

## 2013-07-24 DIAGNOSIS — Z111 Encounter for screening for respiratory tuberculosis: Secondary | ICD-10-CM

## 2013-07-24 DIAGNOSIS — Z113 Encounter for screening for infections with a predominantly sexual mode of transmission: Secondary | ICD-10-CM

## 2013-07-24 DIAGNOSIS — I10 Essential (primary) hypertension: Secondary | ICD-10-CM

## 2013-07-24 DIAGNOSIS — R740 Nonspecific elevation of levels of transaminase and lactic acid dehydrogenase [LDH]: Secondary | ICD-10-CM

## 2013-07-24 LAB — CBC WITH DIFFERENTIAL/PLATELET
Basophils Absolute: 0 10*3/uL (ref 0.0–0.1)
Basophils Relative: 0 % (ref 0–1)
EOS ABS: 0.1 10*3/uL (ref 0.0–0.7)
Eosinophils Relative: 2 % (ref 0–5)
HEMATOCRIT: 42.8 % (ref 39.0–52.0)
HEMOGLOBIN: 15.1 g/dL (ref 13.0–17.0)
LYMPHS ABS: 1.4 10*3/uL (ref 0.7–4.0)
Lymphocytes Relative: 20 % (ref 12–46)
MCH: 30.8 pg (ref 26.0–34.0)
MCHC: 35.3 g/dL (ref 30.0–36.0)
MCV: 87.2 fL (ref 78.0–100.0)
MONO ABS: 0.6 10*3/uL (ref 0.1–1.0)
MONOS PCT: 9 % (ref 3–12)
NEUTROS PCT: 69 % (ref 43–77)
Neutro Abs: 4.8 10*3/uL (ref 1.7–7.7)
Platelets: 313 10*3/uL (ref 150–400)
RBC: 4.91 MIL/uL (ref 4.22–5.81)
RDW: 14.5 % (ref 11.5–15.5)
WBC: 7 10*3/uL (ref 4.0–10.5)

## 2013-07-24 LAB — HEMOGLOBIN A1C
HEMOGLOBIN A1C: 5.9 % — AB (ref <5.7)
Mean Plasma Glucose: 123 mg/dL — ABNORMAL HIGH (ref <117)

## 2013-07-24 MED ORDER — TRAMADOL HCL 50 MG PO TABS: 50.0000 mg | ORAL_TABLET | Freq: Four times a day (QID) | ORAL | Status: DC | PRN

## 2013-07-24 NOTE — Patient Instructions (Addendum)
Recommend the book "the END of DIETING" by Dr Baker Janus - can get at [email or web address]  Get the  Book "The END of DIABETES " by Dr Excell Seltzer  At [email or web address].[email or web address] - get book & Audio CD's      Being diabetic has a  300% increased risk for heart attack, stroke, cancer, and alzheimer- type vascular dementia. It is very important that you work harder with diet by avoiding all foods that are white except chicken & fish. Avoid white rice (brown & wild rice is OK), white potatoes (sweetpotatoes in moderation is OK), White bread or wheat bread or anything made out of white flour like bagels, donuts, rolls, buns, biscuits, cakes, pastries, cookies, pizza crust, and pasta (made from white flour & egg whites) - vegetarian pasta or spinach or wheat pasta is OK. Multigrain breads like Arnold's or Pepper[employer], or multigrain sandwich thins or flatbreads.  Diet, exercise and weight loss can reverse and cure diabetes in the early stages.  Diet, exercise and weight loss is very important in the control and prevention of complications of diabetes which affects every system in your body, ie. Brain - dementia/stroke, eyes - glaucoma/blindness, heart - heart attack/heart failure, kidneys - dialysis, stomach - gastric paralysis, intestines - malabsorption, nerves - severe painful neuritis, circulation - gangrene & loss of a leg(s), and finally cancer and Alzheimers.    I recommend avoid fried & greasy foods,  sweets/candy, white rice (brown or wild rice or Quinoa is OK), white potatoes (sweet potatoes are OK) - anything made from white flour - bagels, doughnuts, rolls, buns, biscuits,white and wheat breads, pizza crust and traditional pasta made of white flour & egg white(vegetarian pasta or spinach or wheat pasta is OK).  Multi-grain bread is OK - like multi-grain flat bread or sandwich thins. Avoid alcohol in excess. Exercise is also important.    Eat all the vegetables you want - avoid meat, especially red meat and dairy -  especially cheese.  Cheese is the most concentrated form of trans-fats which is the worst thing to clog up our arteries. Veggie cheese is OK which can be found in the fresh produce section at [employer] or [employer] or [employer]     Hypertension As your heart beats, it forces blood through your arteries. This force is your blood pressure. If the pressure is too high, it is called hypertension (HTN) or high blood pressure. HTN is dangerous because you may have it and not know it. High blood pressure may mean that your heart has to work harder to pump blood. Your arteries may be narrow or stiff. The extra work puts you at risk for heart disease, stroke, and other problems.  Blood pressure consists of two numbers, a higher number over a lower, 110/72, for example. It is stated as "110 over 72." The ideal is below 120 for the top number (systolic) and under 80 for the bottom (diastolic). Write down your blood pressure today. You should pay close attention to your blood pressure if you have certain conditions such as:  Heart failure.  Prior heart attack.  Diabetes  Chronic kidney disease.  Prior stroke.  Multiple risk factors for heart disease. To see if you have HTN, your blood pressure should be measured while you are seated with your arm held at the level of the heart. It should be measured at least twice. A one-time elevated blood pressure reading (especially in the Emergency Department) does not mean that you need treatment. There  may be conditions in which the blood pressure is different between your right and left arms. It is important to see your caregiver soon for a recheck. Most people have essential hypertension which means that there is not a specific cause. This type of high blood pressure may be lowered by changing lifestyle factors such as:  Stress.  Smoking.  Lack of exercise.  Excessive weight.  Drug/tobacco/alcohol use.  Eating less salt. Most people do not have  symptoms from high blood pressure until it has caused damage to the body. Effective treatment can often prevent, delay or reduce that damage. TREATMENT  When a cause has been identified, treatment for high blood pressure is directed at the cause. There are a large number of medications to treat HTN. These fall into several categories, and your caregiver will help you select the medicines that are best for you. Medications may have side effects. You should review side effects with your caregiver. If your blood pressure stays high after you have made lifestyle changes or started on medicines,   Your medication(s) may need to be changed.  Other problems may need to be addressed.  Be certain you understand your prescriptions, and know how and when to take your medicine.  Be sure to follow up with your caregiver within the time frame advised (usually within two weeks) to have your blood pressure rechecked and to review your medications.  If you are taking more than one medicine to lower your blood pressure, make sure you know how and at what times they should be taken. Taking two medicines at the same time can result in blood pressure that is too low. SEEK IMMEDIATE MEDICAL CARE IF:  You develop a severe headache, blurred or changing vision, or confusion.  You have unusual weakness or numbness, or a faint feeling.  You have severe chest or abdominal pain, vomiting, or breathing problems. MAKE SURE YOU:   Understand these instructions.  Will watch your condition.  Will get help right away if you are not doing well or get worse.   Diabetes and Exercise Exercising regularly is important. It is not just about losing weight. It has many health benefits, such as:  Improving your overall fitness, flexibility, and endurance.  Increasing your bone density.  Helping with weight control.  Decreasing your body fat.  Increasing your muscle strength.  Reducing stress and tension.  Improving  your overall health. People with diabetes who exercise gain additional benefits because exercise:  Reduces appetite.  Improves the body's use of blood sugar (glucose).  Helps lower or control blood glucose.  Decreases blood pressure.  Helps control blood lipids (such as cholesterol and triglycerides).  Improves the body's use of the hormone insulin by:  Increasing the body's insulin sensitivity.  Reducing the body's insulin needs.  Decreases the risk for heart disease because exercising:  Lowers cholesterol and triglycerides levels.  Increases the levels of good cholesterol (such as high-density lipoproteins [HDL]) in the body.  Lowers blood glucose levels. YOUR ACTIVITY PLAN  Choose an activity that you enjoy and set realistic goals. Your health care provider or diabetes educator can help you make an activity plan that works for you. You can break activities into 2 or 3 sessions throughout the day. Doing so is as good as one long session. Exercise ideas include:  Taking the dog for a walk.  Taking the stairs instead of the elevator.  Dancing to your favorite song.  Doing your favorite exercise with a friend. RECOMMENDATIONS  FOR EXERCISING WITH TYPE 1 OR TYPE 2 DIABETES   Check your blood glucose before exercising. If blood glucose levels are greater than 240 mg/dL, check for urine ketones. Do not exercise if ketones are present.  Avoid injecting insulin into areas of the body that are going to be exercised. For example, avoid injecting insulin into:  The arms when playing tennis.  The legs when jogging.  Keep a record of:  Food intake before and after you exercise.  Expected peak times of insulin action.  Blood glucose levels before and after you exercise.  The type and amount of exercise you have done.  Review your records with your health care provider. Your health care provider will help you to develop guidelines for adjusting food intake and insulin  amounts before and after exercising.  If you take insulin or oral hypoglycemic agents, watch for signs and symptoms of hypoglycemia. They include:  Dizziness.  Shaking.  Sweating.  Chills.  Confusion.  Drink plenty of water while you exercise to prevent dehydration or heat stroke. Body water is lost during exercise and must be replaced.  Talk to your health care provider before starting an exercise program to make sure it is safe for you. Remember, almost any type of activity is better than none.    Cholesterol Cholesterol is a white, waxy, fat-like protein needed by your body in small amounts. The liver makes all the cholesterol you need. It is carried from the liver by the blood through the blood vessels. Deposits (plaque) may build up on blood vessel walls. This makes the arteries narrower and stiffer. Plaque increases the risk for heart attack and stroke. You cannot feel your cholesterol level even if it is very high. The only way to know is by a blood test to check your lipid (fats) levels. Once you know your cholesterol levels, you should keep a record of the test results. Work with your caregiver to to keep your levels in the desired range. WHAT THE RESULTS MEAN:  Total cholesterol is a rough measure of all the cholesterol in your blood.  LDL is the so-called bad cholesterol. This is the type that deposits cholesterol in the walls of the arteries. You want this level to be low.  HDL is the good cholesterol because it cleans the arteries and carries the LDL away. You want this level to be high.  Triglycerides are fat that the body can either burn for energy or store. High levels are closely linked to heart disease. DESIRED LEVELS:  Total cholesterol below 200.  LDL below 100 for people at risk, below 70 for very high risk.  HDL above 50 is good, above 60 is best.  Triglycerides below 150. HOW TO LOWER YOUR CHOLESTEROL:  Diet.  Choose fish or white meat chicken and  [place], roasted or baked. Limit fatty cuts of red meat, fried foods, and processed meats, such as sausage and lunch meat.  Eat lots of fresh fruits and vegetables. Choose whole grains, beans, pasta, potatoes and cereals.  Use only small amounts of olive, corn or canola oils. Avoid butter, mayonnaise, shortening or palm kernel oils. Avoid foods with trans-fats.  Use skim/nonfat milk and low-fat/nonfat yogurt and cheeses. Avoid whole milk, cream, ice cream, egg yolks and cheeses. Healthy desserts include angel food cake, ginger snaps, animal crackers, hard candy, popsicles, and low-fat/nonfat frozen yogurt. Avoid pastries, cakes, pies and cookies.  Exercise.  A regular program helps decrease LDL and raises HDL.  Helps with weight  control.  Do things that increase your activity level like gardening, walking, or taking the stairs.  Medication.  May be prescribed by your caregiver to help lowering cholesterol and the risk for heart disease.  You may need medicine even if your levels are normal if you have several risk factors. HOME CARE INSTRUCTIONS   Follow your diet and exercise programs as suggested by your caregiver.  Take medications as directed.  Have blood work done when your caregiver feels it is necessary. MAKE SURE YOU:   Understand these instructions.  Will watch your condition.  Will get help right away if you are not doing well or get worse.      Vitamin D Deficiency Vitamin D is an important vitamin that your body needs. Having too little of it in your body is called a deficiency. A very bad deficiency can make your bones soft and can cause a condition called rickets.  Vitamin D is important to your body for different reasons, such as:   It helps your body absorb 2 minerals called calcium and phosphorus.  It helps make your bones healthy.  It may prevent some diseases, such as diabetes and multiple sclerosis.  It helps your muscles and heart. You can get  vitamin D in several ways. It is a natural part of some foods. The vitamin is also added to some dairy products and cereals. Some people take vitamin D supplements. Also, your body makes vitamin D when you are in the sun. It changes the sun's rays into a form of the vitamin that your body can use. CAUSES   Not eating enough foods that contain vitamin D.  Not getting enough sunlight.  Having certain digestive system diseases that make it hard to absorb vitamin D. These diseases include Crohn's disease, chronic pancreatitis, and cystic fibrosis.  Having a surgery in which part of the stomach or small intestine is removed.  Being obese. Fat cells pull vitamin D out of your blood. That means that obese people may not have enough vitamin D left in their blood and in other body tissues.  Having chronic kidney or liver disease. RISK FACTORS Risk factors are things that make you more likely to develop a vitamin D deficiency. They include:  Being older.  Not being able to get outside very much.  Living in a nursing home.  Having had broken bones.  Having weak or thin bones (osteoporosis).  Having a disease or condition that changes how your body absorbs vitamin D.  Having dark skin.  Some medicines such as seizure medicines or steroids.  Being overweight or obese. SYMPTOMS Mild cases of vitamin D deficiency may not have any symptoms. If you have a very bad case, symptoms may include:  Bone pain.  Muscle pain.  Falling often.  Broken bones caused by a minor injury, due to osteoporosis. DIAGNOSIS A blood test is the best way to tell if you have a vitamin D deficiency. TREATMENT Vitamin D deficiency can be treated in different ways. Treatment for vitamin D deficiency depends on what is causing it. Options include:  Taking vitamin D supplements.  Taking a calcium supplement. Your caregiver will suggest what dose is best for you. HOME CARE INSTRUCTIONS  Take any supplements  that your caregiver prescribes. Follow the directions carefully. Take only the suggested amount.  Have your blood tested 2 months after you start taking supplements.  Eat foods that contain vitamin D. Healthy choices include:  Fortified dairy products, cereals, or juices.  Fortified means vitamin D has been added to the food. Check the label on the package to be sure.  Fatty fish like salmon or trout.  Eggs.  Oysters.  Do not use a tanning bed.  Keep your weight at a healthy level. Lose weight if you need to.  Keep all follow-up appointments. Your caregiver will need to perform blood tests to make sure your vitamin D deficiency is going away. SEEK MEDICAL CARE IF:  You have any questions about your treatment.  You continue to have symptoms of vitamin D deficiency.  You have nausea or vomiting.  You are constipated.  You feel confused.  You have severe abdominal or back pain. MAKE SURE YOU:  Understand these instructions.  Will watch your condition.  Will get help right away if you are not doing well or get worse.

## 2013-07-24 NOTE — Progress Notes (Signed)
Patient ID: [name], male   DOB: Oct 06, 1955, 58 y.o.   MRN: [id number]   Annual Screening Comprehensive Examination  This very nice 58 y.o.MWM presents for complete physical.  Patient has been followed for HTN, ASCAD/Stent, GERD,  Prediabetes, Hyperlipidemia and Vitamin D Deficiency.   HTN predates since 2010. Patient's BP has been controlled at home.Today's BP: 122/80 mmHg. Patient has ASCAD & in 2005 he had a Stent placed Patient denies any cardiac symptoms as chest pain, palpitations, shortness of breath, dizziness or ankle swelling. Patient relates that he had a negative Cardiolite in Mar 2015 by Dr Dorris Carnes.   Patient's hyperlipidemia is controlled with diet and medications. Patient denies myalgias or other medication SE's. Last Lipids in Mar 2015 were at goal.   Lab Results  Component Value Date   CHOL 148 04/17/2013   HDL 46.40 04/17/2013   LDLCALC 80 04/17/2013   TRIG 110.0 04/17/2013   CHOLHDL 3 04/17/2013    Patient has prediabetes since Dec 2014 with an A1c 6.1%  and last A1c was 6.3% in Mar 2015. He did have a nl A1c 5.6% last June 2014.  Patient denies reactive hypoglycemic symptoms, visual blurring, diabetic polys or paresthesias.    Patient has GERD which is controlled on current meds.   Finally, patient has history of Vitamin D Deficiency of 71 in June 2014 and last vitamin D was 85 in Mar 2015 at goal .  Medication Sig  . aspirin EC 81 MG tablet 2 tabs po qd  . atorvastatin  80 MG tablet Take 40 mg by mouth daily.  Marland Kitchen VITAMIN D-3 5000 UNITS TABS Take 1 tablet by mouth daily.  Marland Kitchen esomeprazole (NEXIUM) 20 MG  Takes OTC 20 mg  . lisinopril  10 MG tablet Take one tablet by mouth one time daily.   . meloxicam  15 MG tablet Take 15 mg by mouth daily.  . sildenafil  100 MG tablet Take 1 tablet (100 mg total) by mouth as needed  . nitroGLYCERIN  0.4 MG SL tablet Place 1 tablet  every 5 (five) minutes as needed   Allergies  Allergen Reactions  . Penicillins    unspecified   Past Medical History  Diagnosis Date  . ANXIETY STATE NEC 11/01/2006  . ANXIETY 12/25/2007  . CORONARY ARTERY DISEASE 08/02/2006    off Plavix x 3 years  . DIVERTICULITIS OF COLON 01/06/2010  . DVT, HX OF 08/02/2006  . 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 HYPERLIPIDEMIA 08/02/2006  . HYPERTENSION 08/05/2008  . Memory loss 07/21/2009  . Pilonidal cyst     low back pain    Past Surgical History  Procedure Laterality Date  . Coronary angioplasty with stent placement    . Pilonidal cyst excision      teenager  . Esophagogastroduodenoscopy  06/16/2010    antral ulcers, esophageal stricture - dilated, duodenitis, hiatal hernia  . Colonoscopy w/ polypectomy  06/16/2010    diminutive adenoma, diverticulosis, hemorrhoids    Family History  Problem Relation Age of Onset  . Colon cancer Father     deceased age 60  . Dementia Mother     History   Social History  . Marital Status: Married    Spouse Name: N/A    Number of Children: 2  . Years of Education: N/A   Occupational History  . Secondary school teacher    Social History Main Topics  . Smoking status: Never Smoker   . Smokeless tobacco: Never Used  .  Alcohol Use: Yes     Comment: approx 2 drinks per week  . Drug Use: Yes     Comment: marjuana as per 2011 Echart H&P denies any at this time (06/2010  . Sexual Activity: Not on file   Other Topics Concern  . Not on file   Social History Narrative   Daily caffeine    ROS Constitutional: Denies fever, chills, weight loss/gain, headaches, insomnia, fatigue, night sweats or change in appetite. Eyes: Denies redness, blurred vision, diplopia, discharge, itchy or watery eyes.  ENT: Denies discharge, congestion, post nasal drip, epistaxis, sore throat, earache, hearing loss, dental pain, Tinnitus, Vertigo, Sinus pain or snoring.  Cardio: Denies chest pain, palpitations, irregular heartbeat, syncope, dyspnea, diaphoresis, orthopnea, PND, claudication or edema Respiratory: denies  cough, dyspnea, DOE, pleurisy, hoarseness, laryngitis or wheezing.  Gastrointestinal: Denies dysphagia, heartburn, reflux, water brash, pain, cramps, nausea, vomiting, bloating, diarrhea, constipation, hematemesis, melena, hematochezia, jaundice or hemorrhoids Genitourinary: Denies dysuria, frequency, urgency, nocturia, hesitancy, discharge, hematuria or flank pain Musculoskeletal: Denies arthralgia, myalgia, stiffness, Jt. Swelling, pain, limp or strain/sprain. Skin: Denies puritis, rash, hives, warts, acne, eczema or change in skin lesion Neuro: No weakness, tremor, incoordination, spasms, paresthesia or pain Psychiatric: Denies confusion, memory loss or sensory loss Endocrine: Denies change in weight, skin, hair change, nocturia, and paresthesia, diabetic polys, visual blurring or hyper / hypo glycemic episodes.  Heme/Lymph: No excessive bleeding, bruising or enlarged lymph nodes.  Physical Exam  BP 122/80  Pulse 88  Temp 97.7 F   Resp 16  Ht 5' 10.5"  Wt 209 lb 12.8 oz   BMI 29.67 kg/m2  General Appearance: Well nourished, in no apparent distress. Eyes: PERRLA, EOMs, conjunctiva no swelling or erythema, normal fundi and vessels. Sinuses: No frontal/maxillary tenderness ENT/Mouth: EACs patent / TMs  nl. Nares clear without erythema, swelling, mucoid exudates. Oral hygiene is good. No erythema, swelling, or exudate. Tongue normal, non-obstructing. Tonsils not swollen or erythematous. Hearing normal.  Neck: Supple, thyroid normal. No bruits, nodes or JVD. Respiratory: Respiratory effort normal.  BS equal and clear bilateral without rales, rhonci, wheezing or stridor. Cardio: Heart sounds are normal with regular rate and rhythm and no murmurs, rubs or gallops. Peripheral pulses are normal and equal bilaterally without edema. No aortic or femoral bruits. Chest: symmetric with normal excursions and percussion.  Abdomen: Flat, soft, with bowl sounds. Nontender, no guarding, rebound,  hernias, masses, or organomegaly.  Lymphatics: Non tender without lymphadenopathy.  Genitourinary: No hernias.Testes nl. DRE - prostate nl for age - smooth & firm w/o nodules. Musculoskeletal: Full ROM all peripheral extremities, joint stability, 5/5 strength, and normal gait. Skin: Warm and dry without rashes, lesions, cyanosis, clubbing or  ecchymosis.  Neuro: Cranial nerves intact, reflexes equal bilaterally. Normal muscle tone, no cerebellar symptoms. Sensation intact.  Pysch: Awake and oriented X 3, normal affect, insight and judgment appropriate.   Assessment and Plan  1. Annual Screening Examination 2. Hypertension  3. ASCAD s/p STENT (2005) 4. Hyperlipidemia 4. Pre Diabetes 6. Vitamin D Deficiency 7. GERD Hx/o Stricture  Continue prudent diet as discussed, weight control, BP monitoring, regular exercise, and medications as discussed.  Discussed med effects and SE's. Routine screening labs and tests as requested with regular follow-up as recommended.

## 2013-07-25 LAB — HEPATIC FUNCTION PANEL
ALT: 26 U/L (ref 0–53)
AST: 16 U/L (ref 0–37)
Albumin: 4.4 g/dL (ref 3.5–5.2)
Alkaline Phosphatase: 67 U/L (ref 39–117)
Bilirubin, Direct: 0.1 mg/dL (ref 0.0–0.3)
Indirect Bilirubin: 0.4 mg/dL (ref 0.2–1.2)
Total Bilirubin: 0.5 mg/dL (ref 0.2–1.2)
Total Protein: 6.9 g/dL (ref 6.0–8.3)

## 2013-07-25 LAB — HIV ANTIBODY (ROUTINE TESTING W REFLEX): HIV 1&2 Ab, 4th Generation: NONREACTIVE

## 2013-07-25 LAB — INSULIN, FASTING: Insulin fasting, serum: 19 u[IU]/mL (ref 3–28)

## 2013-07-25 LAB — LIPID PANEL
CHOL/HDL RATIO: 3.4 ratio
Cholesterol: 148 mg/dL (ref 0–200)
HDL: 43 mg/dL (ref >39)
LDL Cholesterol: 80 mg/dL (ref 0–99)
Triglycerides: 124 mg/dL (ref <150)
VLDL: 25 mg/dL (ref 0–40)

## 2013-07-25 LAB — VITAMIN B12: VITAMIN B 12: 294 pg/mL (ref 211–911)

## 2013-07-25 LAB — URINALYSIS, MICROSCOPIC ONLY
BACTERIA UA: NONE SEEN
CASTS: NONE SEEN
Crystals: NONE SEEN
Squamous Epithelial / LPF: NONE SEEN

## 2013-07-25 LAB — MICROALBUMIN / CREATININE URINE RATIO
Creatinine, Urine: 154.7 mg/dL
MICROALB UR: 0.5 mg/dL (ref 0.00–1.89)
MICROALB/CREAT RATIO: 3.2 mg/g (ref 0.0–30.0)

## 2013-07-25 LAB — BASIC METABOLIC PANEL WITH GFR
BUN: 19 mg/dL (ref 6–23)
CALCIUM: 9.8 mg/dL (ref 8.4–10.5)
CO2: 24 meq/L (ref 19–32)
Chloride: 102 mEq/L (ref 96–112)
Creat: 1.19 mg/dL (ref 0.50–1.35)
GFR, Est African American: 77 mL/min
GFR, Est Non African American: 66 mL/min
GLUCOSE: 93 mg/dL (ref 70–99)
Potassium: 4.7 mEq/L (ref 3.5–5.3)
SODIUM: 137 meq/L (ref 135–145)

## 2013-07-25 LAB — HEPATITIS B CORE ANTIBODY, TOTAL: Hep B Core Total Ab: NONREACTIVE

## 2013-07-25 LAB — HEPATITIS C ANTIBODY: HCV Ab: NEGATIVE

## 2013-07-25 LAB — MAGNESIUM: MAGNESIUM: 1.9 mg/dL (ref 1.5–2.5)

## 2013-07-25 LAB — HEPATITIS A ANTIBODY, TOTAL: HEP A TOTAL AB: NONREACTIVE

## 2013-07-25 LAB — TSH: TSH: 3.696 u[IU]/mL (ref 0.350–4.500)

## 2013-07-25 LAB — HEPATITIS B SURFACE ANTIBODY,QUALITATIVE: Hep B S Ab: NEGATIVE

## 2013-07-25 LAB — RPR

## 2013-07-25 LAB — TESTOSTERONE: TESTOSTERONE: 540 ng/dL (ref 300–890)

## 2013-07-25 LAB — PSA: PSA: 2.36 ng/mL (ref <=4.00)

## 2013-07-25 LAB — VITAMIN D 25 HYDROXY (VIT D DEFICIENCY, FRACTURES): Vit D, 25-Hydroxy: 108 ng/mL — ABNORMAL HIGH (ref 30–89)

## 2013-07-26 LAB — HEPATITIS B E ANTIBODY: Hepatitis Be Antibody: NONREACTIVE

## 2013-07-31 LAB — TB SKIN TEST
Induration: 0 mm
TB Skin Test: NEGATIVE

## 2013-09-09 ENCOUNTER — Other Ambulatory Visit: Payer: Self-pay | Admitting: *Deleted

## 2013-09-09 ENCOUNTER — Other Ambulatory Visit: Payer: Self-pay | Admitting: Internal Medicine

## 2013-09-09 MED ORDER — LISINOPRIL 10 MG PO TABS
ORAL_TABLET | ORAL | Status: DC
Start: 2013-09-09 — End: 2014-03-18

## 2013-10-30 ENCOUNTER — Ambulatory Visit (INDEPENDENT_AMBULATORY_CARE_PROVIDER_SITE_OTHER): Payer: 59 | Admitting: Physician Assistant

## 2013-10-30 ENCOUNTER — Encounter: Payer: Self-pay | Admitting: Physician Assistant

## 2013-10-30 VITALS — BP 122/72 | HR 76 | Temp 98.2°F | Resp 16 | Ht 71.0 in | Wt 210.0 lb

## 2013-10-30 DIAGNOSIS — Z79899 Other long term (current) drug therapy: Secondary | ICD-10-CM

## 2013-10-30 DIAGNOSIS — E559 Vitamin D deficiency, unspecified: Secondary | ICD-10-CM

## 2013-10-30 DIAGNOSIS — R7309 Other abnormal glucose: Secondary | ICD-10-CM

## 2013-10-30 DIAGNOSIS — I10 Essential (primary) hypertension: Secondary | ICD-10-CM

## 2013-10-30 DIAGNOSIS — E782 Mixed hyperlipidemia: Secondary | ICD-10-CM

## 2013-10-30 LAB — CBC WITH DIFFERENTIAL/PLATELET
Basophils Absolute: 0 10*3/uL (ref 0.0–0.1)
Basophils Relative: 0 % (ref 0–1)
Eosinophils Absolute: 0.2 10*3/uL (ref 0.0–0.7)
Eosinophils Relative: 2 % (ref 0–5)
HEMATOCRIT: 41.9 % (ref 39.0–52.0)
Hemoglobin: 14.7 g/dL (ref 13.0–17.0)
LYMPHS ABS: 1.2 10*3/uL (ref 0.7–4.0)
LYMPHS PCT: 16 % (ref 12–46)
MCH: 30.3 pg (ref 26.0–34.0)
MCHC: 35.1 g/dL (ref 30.0–36.0)
MCV: 86.4 fL (ref 78.0–100.0)
MONOS PCT: 8 % (ref 3–12)
Monocytes Absolute: 0.6 10*3/uL (ref 0.1–1.0)
NEUTROS ABS: 5.8 10*3/uL (ref 1.7–7.7)
Neutrophils Relative %: 74 % (ref 43–77)
Platelets: 346 10*3/uL (ref 150–400)
RBC: 4.85 MIL/uL (ref 4.22–5.81)
RDW: 13.9 % (ref 11.5–15.5)
WBC: 7.8 10*3/uL (ref 4.0–10.5)

## 2013-10-30 MED ORDER — VORTIOXETINE HBR 10 MG PO TABS: ORAL_TABLET | ORAL | Status: DC

## 2013-10-30 NOTE — Patient Instructions (Signed)
Stress and Stress Management Stress is a normal reaction to life events. It is what you feel when life demands more than you are used to or more than you can handle. Some stress can be useful. For example, the stress reaction can help you catch the last bus of the day, study for a test, or meet a deadline at work. But stress that occurs too often or for too long can cause problems. It can affect your emotional health and interfere with relationships and normal daily activities. Too much stress can weaken your immune system and increase your risk for physical illness. If you already have a medical problem, stress can make it worse. CAUSES  All sorts of life events may cause stress. An event that causes stress for one person may not be stressful for another person. Major life events commonly cause stress. These may be positive or negative. Examples include losing your job, moving into a new home, getting married, having a baby, or losing a loved one. Less obvious life events may also cause stress, especially if they occur day after day or in combination. Examples include working long hours, driving in traffic, caring for children, being in debt, or being in a difficult relationship. SIGNS AND SYMPTOMS Stress may cause emotional symptoms including, the following:  Anxiety. This is feeling worried, afraid, on edge, overwhelmed, or out of control.  Anger. This is feeling irritated or impatient.  Depression. This is feeling sad, down, helpless, or guilty.  Difficulty focusing, remembering, or making decisions. Stress may cause physical symptoms, including the following:   Aches and pains. These may affect your head, neck, back, stomach, or other areas of your body.  Tight muscles or clenched jaw.  Low energy or trouble sleeping. Stress may cause unhealthy behaviors, including the following:   Eating to feel better (overeating) or skipping meals.  Sleeping too little, too much, or both.  Working  too much or putting off tasks (procrastination).  Smoking, drinking alcohol, or using drugs to feel better. DIAGNOSIS  Stress is diagnosed through an assessment by your health care provider. Your health care provider will ask questions about your symptoms and any stressful life events.Your health care provider will also ask about your medical history and may order blood tests or other tests. Certain medical conditions and medicine can cause physical symptoms similar to stress. Mental illness can cause emotional symptoms and unhealthy behaviors similar to stress. Your health care provider may refer you to a mental health professional for further evaluation.  TREATMENT  Stress management is the recommended treatment for stress.The goals of stress management are reducing stressful life events and coping with stress in healthy ways.  Techniques for reducing stressful life events include the following:  Stress identification. Self-monitor for stress and identify what causes stress for you. These skills may help you to avoid some stressful events.  Time management. Set your priorities, keep a calendar of events, and learn to say "no." These tools can help you avoid making too many commitments. Techniques for coping with stress include the following:  Rethinking the problem. Try to think realistically about stressful events rather than ignoring them or overreacting. Try to find the positives in a stressful situation rather than focusing on the negatives.  Exercise. Physical exercise can release both physical and emotional tension. The key is to find a form of exercise you enjoy and do it regularly.  Relaxation techniques. These relax the body and mind. Examples include yoga, meditation, tai chi, biofeedback, deep  breathing, progressive muscle relaxation, listening to music, being out in nature, journaling, and other hobbies. Again, the key is to find one or more that you enjoy and can do  regularly.  Healthy lifestyle. Eat a balanced diet, get plenty of sleep, and do not smoke. Avoid using alcohol or drugs to relax.  Strong support network. Spend time with family, friends, or other people you enjoy being around.Express your feelings and talk things over with someone you trust. Counseling or talktherapy with a mental health professional may be helpful if you are having difficulty managing stress on your own. Medicine is typically not recommended for the treatment of stress.Talk to your health care provider if you think you need medicine for symptoms of stress. HOME CARE INSTRUCTIONS  Keep all follow-up visits as directed by your health care provider.  Take all medicines as directed by your health care provider. SEEK MEDICAL CARE IF:  Your symptoms get worse or you start having new symptoms.  You feel overwhelmed by your problems and can no longer manage them on your own. SEEK IMMEDIATE MEDICAL CARE IF:  You feel like hurting yourself or someone else. Document Released: 07/20/2000 Document Revised: 06/10/2013 Document Reviewed: 09/18/2012 [facility] Patient Information 2015 [place], [place]. This information is not intended to replace advice given to you by your health care provider. Make sure you discuss any questions you have with your health care provider.   Vortioxetine oral tablet What is this medicine? Vortioxetine (vor tee [employer] e teen) is used to treat depression. This medicine may be used for other purposes; ask your health care provider or pharmacist if you have questions. COMMON BRAND NAME(S): BRINTELLIX What should I tell my health care provider before I take this medicine? They need to know if you have any of these conditions: -bipolar disorder or a family history of bipolar disorder -bleeding disorders -drink alcohol -glaucoma -liver disease -low levels of sodium in the blood -seizures -suicidal thoughts, plans, or attempt; a previous suicide attempt by  you or a family member -take medicines that treat or prevent blood clots -an unusual or allergic reaction to vortioxetine, other medicines, foods, dyes, or preservatives -pregnant or trying to get pregnant -breast-feeding How should I use this medicine? Take this medicine by mouth with a glass of water. Follow the directions on the prescription label. You can take it with or without food. If it upsets your stomach, take it with food. Take your medicine at regular intervals. Do not take it more often than directed. Do not stop taking this medicine suddenly except upon the advice of your doctor. Stopping this medicine too quickly may cause serious side effects or your condition may worsen. A special MedGuide will be given to you by the pharmacist with each prescription and refill. Be sure to read this information carefully each time. Talk to your pediatrician regarding the use of this medicine in children. Special care may be needed. Overdosage: If you think you've taken too much of this medicine contact a poison control center or emergency room at once. Overdosage: If you think you have taken too much of this medicine contact a poison control center or emergency room at once. NOTE: This medicine is only for you. Do not share this medicine with others. What if I miss a dose? If you miss a dose, take it as soon as you can. If it is almost time for your next dose, take only that dose. Do not take double or extra doses. What may interact with  this medicine? Do not take this medicine with any of the following medications: -linezolid -MAOIs like Carbex, Eldepryl, Marplan, Nardil, and Parnate -methylene blue (injected into a vein) This medicine may also interact with the following medications: -alcohol -aspirin and aspirin-like medicines -carbamazepine -certain medicines for depression, anxiety, or psychotic disturbances -certain medicines for migraine headache like almotriptan, eletriptan,  frovatriptan, naratriptan, rizatriptan, sumatriptan, zolmitriptan -diuretics -fentanyl -furazolidone -isoniazid -medicines that treat or prevent blood clots like warfarin, enoxaparin, and dalteparin -NSAIDs, medicines for pain and inflammation, like ibuprofen or naproxen -phenytoin -procarbazine -quinidine -rasagiline -rifampin -supplements like St. John's wort, kava kava, valerian -tramadol -tryptophan This list may not describe all possible interactions. Give your health care provider a list of all the medicines, herbs, non-prescription drugs, or dietary supplements you use. Also tell them if you smoke, drink alcohol, or use illegal drugs. Some items may interact with your medicine. What should I watch for while using this medicine? Tell your doctor if your symptoms do not get better or if they get worse. Visit your doctor or health care professional for regular checks on your progress. Because it may take several weeks to see the full effects of this medicine, it is important to continue your treatment as prescribed by your doctor. Patients and their families should watch out for new or worsening thoughts of suicide or depression. Also watch out for sudden changes in feelings such as feeling anxious, agitated, panicky, irritable, hostile, aggressive, impulsive, severely restless, overly excited and hyperactive, or not being able to sleep. If this happens, especially at the beginning of treatment or after a change in dose, call your health care professional. Dennis Bast may get drowsy or dizzy. Do not drive, use machinery, or do anything that needs mental alertness until you know how this medicine affects you. Do not stand or sit up quickly, especially if you are an older patient. This reduces the risk of dizzy or fainting spells. Alcohol may interfere with the effect of this medicine. Avoid alcoholic drinks. Your mouth may get dry. Chewing sugarless gum or sucking hard candy, and drinking plenty of  water may help. Contact your doctor if the problem does not go away or is severe. What side effects may I notice from receiving this medicine? Side effects that you should report to your doctor or health care professional as soon as possible: -allergic reactions like skin rash, itching or hives, swelling of the face, lips, or tongue -confusion -fast talking and excited feelings or actions that are out of control -feeling faint or lightheaded, falls -hallucination, loss of contact with reality -seizures -suicidal thoughts or other mood changes -unusual bleeding or bruising -unusual body movements or muscle twitching -weakness Side effects that usually do not require medical attention (Report these to your doctor or health care professional if they continue or are bothersome.): -change in sex drive or performance -constipation -diarrhea -dizziness -dry mouth -nausea This list may not describe all possible side effects. Call your doctor for medical advice about side effects. You may report side effects to FDA at 1-800-FDA-1088. Where should I keep my medicine? Keep out of the reach of children. Store at room temperature between 15 and 30 degrees C (59 and 86 degrees F). Throw away any unused medicine after the expiration date. NOTE: This sheet is a summary. It may not cover all possible information. If you have questions about this medicine, talk to your doctor, pharmacist, or health care provider.  2015, [employer]. (2012-08-17 13:22:25)

## 2013-10-30 NOTE — Progress Notes (Signed)
Assessment and Plan:  Hypertension: Continue medication, monitor blood pressure at home. Continue DASH diet. Cholesterol: Continue diet and exercise. Check cholesterol.  Pre-diabetes-Continue diet and exercise. Check A1C Vitamin D Def- check level and continue medications.  Depression/Anxiety- try brintellix samples 10mg , stress management techniques discussed, increase water, good sleep hygiene discussed, increase exercise, and increase veggies.   OVER 40 minutes of exam, counseling, chart review, referral performed Continue diet and meds as discussed. Further disposition pending results of labs.  HPI 57 y.o. male  presents for 3 month follow up with hypertension, hyperlipidemia, prediabetes and vitamin D. His blood pressure has been controlled at home, today their BP is BP: 122/72 mmHg He does not workout. He denies chest pain, shortness of breath, dizziness. He has history of ASHD with stent in 2005, normal cardolite 2015.  He is on cholesterol medication and denies myalgias. His cholesterol is at goal. The cholesterol last visit was:   Lab Results  Component Value Date   CHOL 148 07/24/2013   HDL 43 07/24/2013   LDLCALC 80 07/24/2013   LDLDIRECT 190.6 07/30/2010   TRIG 124 07/24/2013   CHOLHDL 3.4 07/24/2013   He has been working on diet and exercise for prediabetes, and denies nausea, paresthesia of the feet and polydipsia. Last A1C in the office was:  Lab Results  Component Value Date   HGBA1C 5.9* 07/24/2013   Patient is on Vitamin D supplement.   Lab Results  Component Value Date   VD25OH 108* 07/24/2013     Patient states that he has a history of anxiety and depression, is a constant "worrier" but over the last 3- 43months it has been worse, his wife had a stroke in 2008 and is having vestibular symptoms, has not had sex since 2008, decreased energy, having decreased happiness at work and he is not not as happy as he use to be. His son is also having troubles that he prefers to not  talk about. He has been on wellbutrin, zoloft, celexa, and lexapro in the past and has not done well with these. No SI/HI.   Current Medications:  Current Outpatient Prescriptions on File Prior to Visit  Medication Sig Dispense Refill  . aspirin EC 81 MG tablet 2 tabs po qd      . atorvastatin (LIPITOR) 80 MG tablet Take 40 mg by mouth daily.      . Cholecalciferol (VITAMIN D-3) 5000 UNITS TABS Take 1 tablet by mouth daily.      Marland Kitchen esomeprazole (NEXIUM) 20 MG capsule Take 20 mg by mouth daily at 12 noon. Takes OTC 20 mg      . lisinopril (PRINIVIL,ZESTRIL) 10 MG tablet Take one tablet by mouth one time daily. Onlt take when blood pressure is high.  90 tablet  1  . nitroGLYCERIN (NITROSTAT) 0.4 MG SL tablet Place 1 tablet (0.4 mg total) under the tongue every 5 (five) minutes as needed for chest pain.  25 tablet  3  . sildenafil (VIAGRA) 100 MG tablet Take 1 tablet (100 mg total) by mouth as needed for erectile dysfunction.  9 tablet  1   No current facility-administered medications on file prior to visit.   Medical History:  Past Medical History  Diagnosis Date  . ANXIETY STATE NEC 11/01/2006  . ANXIETY 12/25/2007  . CORONARY ARTERY DISEASE 08/02/2006    off Plavix x 3 years  . DIVERTICULITIS OF COLON 01/06/2010  . DVT, HX OF 08/02/2006  . 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 HYPERLIPIDEMIA  08/02/2006  . HYPERTENSION 08/05/2008  . Memory loss 07/21/2009  . Pilonidal cyst     low back pain   Allergies:  Allergies  Allergen Reactions  . Penicillins     unspecified     Review of Systems: [X]  = complains of  [ ]  = denies  General: Fatigue [ ]  Fever [ ]  Chills [ ]  Weakness [ ]   Insomnia [ ]  Eyes: Redness [ ]  Blurred vision [ ]  Diplopia [ ]   ENT: Congestion [ ]  Sinus Pain [ ]  Post Nasal Drip [ ]  Sore Throat [ ]  Earache [ ]   Cardiac: Chest pain/pressure [ ]  SOB [ ]  Orthopnea [ ]   Palpitations [ ]   Paroxysmal nocturnal dyspnea[ ]  Claudication [ ]  Edema [ ]   Pulmonary: Cough [ ]  Wheezing[ ]    SOB [ ]   Snoring [ ]   GI: Nausea [ ]  Vomiting[ ]  Dysphagia[ ]  Heartburn[ ]  Abdominal pain [ ]  Constipation [ ] ; Diarrhea [ ] ; BRBPR [ ]  Melena[ ]  GU: Hematuria[ ]  Dysuria [ ]  Nocturia[ ]  Urgency [ ]   Hesitancy [ ]  Discharge [ ]  Neuro: Headaches[ ]  Vertigo[ ]  Paresthesias[ ]  Spasm [ ]  Speech changes [ ]  Incoordination [ ]   Ortho: Arthritis [ ]  Joint pain [ ]  Muscle pain [ ]  Joint swelling [ ]  Back Pain [ ]  Skin:  Rash [ ]   Pruritis [ ]  Change in skin lesion [ ]   Psych: Depression[x ] Anxiety[ x] Confusion [ ]  Memory loss [ ]   Heme/Lypmh: Bleeding [ ]  Bruising [ ]  Enlarged lymph nodes [ ]   Endocrine: Visual blurring [ ]  Paresthesia [ ]  Polyuria [ ]  Polydypsea [ ]    Heat/cold intolerance [ ]  Hypoglycemia [ ]   Family history- Review and unchanged Social history- Review and unchanged Physical Exam: BP 122/72  Pulse 76  Temp(Src) 98.2 F (36.8 C)  Resp 16  Ht 5\' 11"  (1.803 m)  Wt 210 lb (95.255 kg)  BMI 29.30 kg/m2 Wt Readings from Last 3 Encounters:  10/30/13 210 lb (95.255 kg)  07/24/13 209 lb 12.8 oz (95.165 kg)  04/30/13 212 lb (96.163 kg)   General Appearance: Well nourished, in no apparent distress. Eyes: PERRLA, EOMs, conjunctiva no swelling or erythema Sinuses: No Frontal/maxillary tenderness ENT/Mouth: Ext aud canals clear, TMs without erythema, bulging. No erythema, swelling, or exudate on post pharynx.  Tonsils not swollen or erythematous. Hearing normal.  Neck: Supple, thyroid normal.  Respiratory: Respiratory effort normal, BS equal bilaterally without rales, rhonchi, wheezing or stridor.  Cardio: RRR with no MRGs. Brisk peripheral pulses without edema.  Abdomen: Soft, + BS.  Non tender, no guarding, rebound, hernias, masses. Lymphatics: Non tender without lymphadenopathy.  Musculoskeletal: Full ROM, 5/5 strength, normal gait.  Skin: Warm, dry without rashes, lesions, ecchymosis.  Neuro: Cranial nerves intact. Normal muscle tone, no cerebellar symptoms. Sensation intact.   Psych: Awake and oriented X 3, normal affect, Insight and Judgment appropriate.    Vicie Mutters 10:00 AM

## 2013-10-31 LAB — LIPID PANEL
Cholesterol: 170 mg/dL (ref 0–200)
HDL: 43 mg/dL (ref >39)
LDL CALC: 89 mg/dL (ref 0–99)
Total CHOL/HDL Ratio: 4 Ratio
Triglycerides: 189 mg/dL — ABNORMAL HIGH (ref <150)
VLDL: 38 mg/dL (ref 0–40)

## 2013-10-31 LAB — HEPATIC FUNCTION PANEL
ALT: 27 U/L (ref 0–53)
AST: 18 U/L (ref 0–37)
Albumin: 4.1 g/dL (ref 3.5–5.2)
Alkaline Phosphatase: 58 U/L (ref 39–117)
BILIRUBIN DIRECT: 0.1 mg/dL (ref 0.0–0.3)
BILIRUBIN INDIRECT: 0.4 mg/dL (ref 0.2–1.2)
BILIRUBIN TOTAL: 0.5 mg/dL (ref 0.2–1.2)
Total Protein: 6.8 g/dL (ref 6.0–8.3)

## 2013-10-31 LAB — BASIC METABOLIC PANEL WITH GFR
BUN: 20 mg/dL (ref 6–23)
CO2: 25 meq/L (ref 19–32)
Calcium: 9.7 mg/dL (ref 8.4–10.5)
Chloride: 103 mEq/L (ref 96–112)
Creat: 1.2 mg/dL (ref 0.50–1.35)
GFR, EST AFRICAN AMERICAN: 76 mL/min
GFR, Est Non African American: 66 mL/min
GLUCOSE: 83 mg/dL (ref 70–99)
Potassium: 5 mEq/L (ref 3.5–5.3)
Sodium: 138 mEq/L (ref 135–145)

## 2013-10-31 LAB — VITAMIN D 25 HYDROXY (VIT D DEFICIENCY, FRACTURES): Vit D, 25-Hydroxy: 68 ng/mL (ref 30–89)

## 2013-10-31 LAB — HEMOGLOBIN A1C
Hgb A1c MFr Bld: 6.1 % — ABNORMAL HIGH (ref <5.7)
Mean Plasma Glucose: 128 mg/dL — ABNORMAL HIGH (ref <117)

## 2013-10-31 LAB — INSULIN, FASTING: INSULIN FASTING, SERUM: 13.9 u[IU]/mL (ref 2.0–19.6)

## 2013-10-31 LAB — TSH: TSH: 3.33 u[IU]/mL (ref 0.350–4.500)

## 2013-10-31 LAB — MAGNESIUM: Magnesium: 1.8 mg/dL (ref 1.5–2.5)

## 2013-11-28 ENCOUNTER — Ambulatory Visit: Payer: Self-pay | Admitting: Physician Assistant

## 2013-12-06 ENCOUNTER — Other Ambulatory Visit: Payer: Self-pay | Admitting: Internal Medicine

## 2013-12-16 ENCOUNTER — Encounter: Payer: Self-pay | Admitting: Physician Assistant

## 2013-12-16 ENCOUNTER — Ambulatory Visit (INDEPENDENT_AMBULATORY_CARE_PROVIDER_SITE_OTHER): Payer: 59 | Admitting: Physician Assistant

## 2013-12-16 VITALS — BP 110/72 | HR 80 | Temp 97.7°F | Resp 16 | Wt 214.0 lb

## 2013-12-16 DIAGNOSIS — Z79899 Other long term (current) drug therapy: Secondary | ICD-10-CM

## 2013-12-16 DIAGNOSIS — R7309 Other abnormal glucose: Secondary | ICD-10-CM

## 2013-12-16 DIAGNOSIS — E559 Vitamin D deficiency, unspecified: Secondary | ICD-10-CM

## 2013-12-16 DIAGNOSIS — E782 Mixed hyperlipidemia: Secondary | ICD-10-CM

## 2013-12-16 DIAGNOSIS — I10 Essential (primary) hypertension: Secondary | ICD-10-CM

## 2013-12-16 DIAGNOSIS — R7303 Prediabetes: Secondary | ICD-10-CM

## 2013-12-16 NOTE — Progress Notes (Addendum)
Assessment and Plan:  Hypertension: Continue medication, monitor blood pressure at home. Continue DASH diet.  Reminder to go to the ER if any CP, SOB, nausea, dizziness, severe HA, changes vision/speech, left arm numbness and tingling, and jaw pain. Cholesterol: Continue diet and exercise. Check cholesterol.  Pre-diabetes-Continue diet and exercise. Check A1C, STOP SODAS and SWEET TEA Vitamin D Def- check level and continue medications.  ASHD-Control blood pressure, cholesterol, glucose, increase exercise.  Depression- declines meds at this  time.   Continue diet and meds as discussed. Further disposition pending results of labs.  Addendum: New hypothyroidism, sent in levothyroxine 60mcg, may help depression/weight loss- recheck labs only 1 month. Kidney function elevated, told to increase water and decrease NSAIDS, recheck 1 month with TSH.   HPI 58 y.o. male  presents for 3 month follow up with hypertension, hyperlipidemia, prediabetes and vitamin D. His blood pressure has been controlled at home, today their BP is BP: 110/72 mmHg He does workout. He denies chest pain, shortness of breath, dizziness.  He has a history of ASHD with a stent in 2005, negative cardiolite in 2015 with Dr. Harrington Challenger.  He is on cholesterol medication, lipitor and denies myalgias. His cholesterol is at goal. The cholesterol last visit was:   Lab Results  Component Value Date   CHOL 170 10/30/2013   HDL 43 10/30/2013   LDLCALC 89 10/30/2013   LDLDIRECT 190.6 07/30/2010   TRIG 189* 10/30/2013   CHOLHDL 4.0 10/30/2013   He has been working on diet and exercise for prediabetes, and denies paresthesia of the feet, polydipsia, polyuria and visual disturbances. Last A1C in the office was:  Lab Results  Component Value Date   HGBA1C 6.1* 10/30/2013   Patient is on Vitamin D supplement.   Lab Results  Component Value Date   VD25OH 34 10/30/2013     Last visit he was started on Brintellix for depression but states  he has stopped it and is feeling better, declines SI/HI and declines meds at this time.   Current Medications:  Current Outpatient Prescriptions on File Prior to Visit  Medication Sig Dispense Refill  . aspirin EC 81 MG tablet 2 tabs po qd    . atorvastatin (LIPITOR) 80 MG tablet Take 40 mg by mouth daily.    Marland Kitchen atorvastatin (LIPITOR) 80 MG tablet take one tablet by mouth daily for cholesterol 90 tablet 1  . Cholecalciferol (VITAMIN D-3) 5000 UNITS TABS Take 1 tablet by mouth daily.    Marland Kitchen esomeprazole (NEXIUM) 20 MG capsule Take 20 mg by mouth daily at 12 noon. Takes OTC 20 mg    . lisinopril (PRINIVIL,ZESTRIL) 10 MG tablet Take one tablet by mouth one time daily. Onlt take when blood pressure is high. 90 tablet 1  . nitroGLYCERIN (NITROSTAT) 0.4 MG SL tablet Place 1 tablet (0.4 mg total) under the tongue every 5 (five) minutes as needed for chest pain. 25 tablet 3  . sildenafil (VIAGRA) 100 MG tablet Take 1 tablet (100 mg total) by mouth as needed for erectile dysfunction. 9 tablet 1  . Vortioxetine HBr (BRINTELLIX) 10 MG TABS Once daily 30 tablet 0   No current facility-administered medications on file prior to visit.   Medical History:  Past Medical History  Diagnosis Date  . ANXIETY STATE NEC 11/01/2006  . ANXIETY 12/25/2007  . CORONARY ARTERY DISEASE 08/02/2006    off Plavix x 3 years  . DIVERTICULITIS OF COLON 01/06/2010  . DVT, HX OF 08/02/2006  . GERD with  stricture 08/02/2006  . HYPERLIPIDEMIA 08/02/2006  . HYPERTENSION 08/05/2008  . Memory loss 07/21/2009  . Pilonidal cyst     low back pain   Allergies:  Allergies  Allergen Reactions  . Penicillins     unspecified     Review of Systems: [X]  = complains of  [ ]  = denies  General: Fatigue [ ]  Fever [ ]  Chills [ ]  Weakness [ ]   Insomnia [ ]  Eyes: Redness [ ]  Blurred vision [ ]  Diplopia [ ]   ENT: Congestion [ ]  Sinus Pain [ ]  Post Nasal Drip [ ]  Sore Throat [ ]  Earache [ ]   Cardiac: Chest pain/pressure [ ]  SOB [ ]  Orthopnea  [ ]   Palpitations [ ]   Paroxysmal nocturnal dyspnea[ ]  Claudication [ ]  Edema [ ]   Pulmonary: Cough [ ]  Wheezing[ ]   SOB [ ]   Snoring [ ]   GI: Nausea [ ]  Vomiting[ ]  Dysphagia[ ]  Heartburn[ ]  Abdominal pain [ ]  Constipation [ ] ; Diarrhea [ ] ; BRBPR [ ]  Melena[ ]  GU: Hematuria[ ]  Dysuria [ ]  Nocturia[ ]  Urgency [ ]   Hesitancy [ ]  Discharge [ ]  Neuro: Headaches[ ]  Vertigo[ ]  Paresthesias[ ]  Spasm [ ]  Speech changes [ ]  Incoordination [ ]   Ortho: Arthritis [ ]  Joint pain [ ]  Muscle pain [ ]  Joint swelling [ ]  Back Pain [ ]  Skin:  Rash [ ]   Pruritis [ ]  Change in skin lesion [ ]   Psych: Depression[ ]  Anxiety[ ]  Confusion [ ]  Memory loss [ ]   Heme/Lypmh: Bleeding [ ]  Bruising [ ]  Enlarged lymph nodes [ ]   Endocrine: Visual blurring [ ]  Paresthesia [ ]  Polyuria [ ]  Polydypsea [ ]    Heat/cold intolerance [ ]  Hypoglycemia [ ]   Family history- Review and unchanged Social history- Review and unchanged Physical Exam: BP 110/72 mmHg  Pulse 80  Temp(Src) 97.7 F (36.5 C)  Resp 16  Wt 214 lb (97.07 kg) Wt Readings from Last 3 Encounters:  12/16/13 214 lb (97.07 kg)  10/30/13 210 lb (95.255 kg)  07/24/13 209 lb 12.8 oz (95.165 kg)   General Appearance: Well nourished, in no apparent distress. Eyes: PERRLA, EOMs, conjunctiva no swelling or erythema Sinuses: No Frontal/maxillary tenderness ENT/Mouth: Ext aud canals clear, TMs without erythema, bulging. No erythema, swelling, or exudate on post pharynx.  Tonsils not swollen or erythematous. Hearing normal.  Neck: Supple, thyroid normal.  Respiratory: Respiratory effort normal, BS equal bilaterally without rales, rhonchi, wheezing or stridor.  Cardio: RRR with no MRGs. Brisk peripheral pulses without edema.  Abdomen: Soft, + BS.  Non tender, no guarding, rebound, hernias, masses. Lymphatics: Non tender without lymphadenopathy.  Musculoskeletal: Full ROM, 5/5 strength, normal gait.  Skin: Warm, dry without rashes, lesions, ecchymosis.  Neuro:  Cranial nerves intact. Normal muscle tone, no cerebellar symptoms. Sensation intact.  Psych: Awake and oriented X 3, normal affect, Insight and Judgment appropriate.    Vicie Mutters, PA-C 4:25 PM [facility]

## 2013-12-16 NOTE — Patient Instructions (Signed)
STOP SODAS and SWEET TEA     Bad carbs also include fruit juice, alcohol, and sweet tea. These are empty calories that do not signal to your brain that you are full.   Please remember the good carbs are still carbs which convert into sugar. So please measure them out no more than 1/2-1 cup of rice, oatmeal, pasta, and beans  Veggies are however free foods! Pile them on.   Not all fruit is created equal. Please see the list below, the fruit at the bottom is higher in sugars than the fruit at the top. Please avoid all dried fruits.

## 2013-12-17 ENCOUNTER — Other Ambulatory Visit: Payer: Self-pay | Admitting: Physician Assistant

## 2013-12-17 LAB — BASIC METABOLIC PANEL WITH GFR
BUN: 13 mg/dL (ref 6–23)
CHLORIDE: 104 meq/L (ref 96–112)
CO2: 25 meq/L (ref 19–32)
CREATININE: 1.53 mg/dL — AB (ref 0.50–1.35)
Calcium: 9.3 mg/dL (ref 8.4–10.5)
GFR, Est African American: 57 mL/min — ABNORMAL LOW
GFR, Est Non African American: 49 mL/min — ABNORMAL LOW
Glucose, Bld: 116 mg/dL — ABNORMAL HIGH (ref 70–99)
POTASSIUM: 4.4 meq/L (ref 3.5–5.3)
SODIUM: 137 meq/L (ref 135–145)

## 2013-12-17 LAB — CBC WITH DIFFERENTIAL/PLATELET
Basophils Absolute: 0.1 10*3/uL (ref 0.0–0.1)
Basophils Relative: 1 % (ref 0–1)
EOS PCT: 4 % (ref 0–5)
Eosinophils Absolute: 0.3 10*3/uL (ref 0.0–0.7)
HCT: 40.4 % (ref 39.0–52.0)
Hemoglobin: 14.1 g/dL (ref 13.0–17.0)
LYMPHS ABS: 1.7 10*3/uL (ref 0.7–4.0)
LYMPHS PCT: 20 % (ref 12–46)
MCH: 30.3 pg (ref 26.0–34.0)
MCHC: 34.9 g/dL (ref 30.0–36.0)
MCV: 86.7 fL (ref 78.0–100.0)
Monocytes Absolute: 0.8 10*3/uL (ref 0.1–1.0)
Monocytes Relative: 9 % (ref 3–12)
Neutro Abs: 5.7 10*3/uL (ref 1.7–7.7)
Neutrophils Relative %: 66 % (ref 43–77)
PLATELETS: 322 10*3/uL (ref 150–400)
RBC: 4.66 MIL/uL (ref 4.22–5.81)
RDW: 14.1 % (ref 11.5–15.5)
WBC: 8.6 10*3/uL (ref 4.0–10.5)

## 2013-12-17 LAB — LIPID PANEL
CHOLESTEROL: 210 mg/dL — AB (ref 0–200)
HDL: 48 mg/dL (ref >39)
LDL Cholesterol: 118 mg/dL — ABNORMAL HIGH (ref 0–99)
TRIGLYCERIDES: 218 mg/dL — AB (ref <150)
Total CHOL/HDL Ratio: 4.4 Ratio
VLDL: 44 mg/dL — AB (ref 0–40)

## 2013-12-17 LAB — HEPATIC FUNCTION PANEL
ALBUMIN: 4 g/dL (ref 3.5–5.2)
ALT: 17 U/L (ref 0–53)
AST: 13 U/L (ref 0–37)
Alkaline Phosphatase: 54 U/L (ref 39–117)
Bilirubin, Direct: 0.1 mg/dL (ref 0.0–0.3)
Indirect Bilirubin: 0.2 mg/dL (ref 0.2–1.2)
TOTAL PROTEIN: 6.2 g/dL (ref 6.0–8.3)
Total Bilirubin: 0.3 mg/dL (ref 0.2–1.2)

## 2013-12-17 LAB — VITAMIN D 25 HYDROXY (VIT D DEFICIENCY, FRACTURES): Vit D, 25-Hydroxy: 53 ng/mL (ref 30–89)

## 2013-12-17 LAB — TSH: TSH: 5.598 u[IU]/mL — AB (ref 0.350–4.500)

## 2013-12-17 LAB — HEMOGLOBIN A1C
HEMOGLOBIN A1C: 6.1 % — AB (ref <5.7)
Mean Plasma Glucose: 128 mg/dL — ABNORMAL HIGH (ref <117)

## 2013-12-17 LAB — INSULIN, FASTING: Insulin fasting, serum: 43.4 u[IU]/mL — ABNORMAL HIGH (ref 2.0–19.6)

## 2013-12-17 LAB — MAGNESIUM: Magnesium: 1.8 mg/dL (ref 1.5–2.5)

## 2013-12-17 MED ORDER — LEVOTHYROXINE SODIUM 75 MCG PO TABS: 75.0000 ug | ORAL_TABLET | Freq: Every day | ORAL | Status: DC

## 2013-12-17 NOTE — Addendum Note (Signed)
Addended by: Vicie Mutters R on: 12/17/2013 09:02 AM   Modules accepted: Orders

## 2014-01-15 ENCOUNTER — Other Ambulatory Visit: Payer: Self-pay

## 2014-01-15 DIAGNOSIS — N289 Disorder of kidney and ureter, unspecified: Secondary | ICD-10-CM

## 2014-01-15 DIAGNOSIS — E039 Hypothyroidism, unspecified: Secondary | ICD-10-CM

## 2014-01-22 ENCOUNTER — Ambulatory Visit: Payer: Self-pay | Admitting: Internal Medicine

## 2014-01-23 ENCOUNTER — Ambulatory Visit: Payer: Self-pay | Admitting: Internal Medicine

## 2014-03-03 ENCOUNTER — Encounter: Payer: Self-pay | Admitting: Physician Assistant

## 2014-03-03 ENCOUNTER — Ambulatory Visit (INDEPENDENT_AMBULATORY_CARE_PROVIDER_SITE_OTHER): Payer: 59 | Admitting: Physician Assistant

## 2014-03-03 VITALS — BP 128/60 | HR 90 | Temp 98.2°F | Resp 18 | Ht 70.5 in | Wt 219.0 lb

## 2014-03-03 DIAGNOSIS — K047 Periapical abscess without sinus: Secondary | ICD-10-CM

## 2014-03-03 MED ORDER — DOXYCYCLINE HYCLATE 100 MG PO TABS: 100.0000 mg | ORAL_TABLET | Freq: Two times a day (BID) | ORAL | Status: DC

## 2014-03-03 MED ORDER — HYDROCODONE-ACETAMINOPHEN 5-325 MG PO TABS
1.0000 | ORAL_TABLET | Freq: Four times a day (QID) | ORAL | Status: DC | PRN
Start: 2014-03-03 — End: 2014-07-30

## 2014-03-03 NOTE — Progress Notes (Signed)
HPI  A Caucasian 59 y.o.male presents to the office today due to dental pain that started last Friday (4 days ago).  He states the pain comes and goes and gets worse throughout the day.  The throbbing pain is located in right lower jaw and is an 8 out of 10 right now.  He has had dental abscess before and states this feels the same.  His last dental exam was about 6 months ago.  He has taken 3-4 Aleve and Aspirin OTC every couple of hours.  He has even tried [place] and states that helped a little bit with pain.  He reports headache, sinus pressure and non-productive cough.  He denies fever, chills, sweats, fatigue, ear pain/congestion, post-nasal drip, sore throat, trouble swallowing, chest tightness, SOB, CP, abdominal pain, nausea, vomiting, diarrhea, constipation, dizziness and lightheadedness.  Review of Systems  All other systems reviewed and are negative.  Past Medical History-  Past Medical History  Diagnosis Date  . ANXIETY STATE NEC 11/01/2006  . ANXIETY 12/25/2007  . CORONARY ARTERY DISEASE 08/02/2006    off Plavix x 3 years  . DIVERTICULITIS OF COLON 01/06/2010  . DVT, HX OF 08/02/2006  . 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 HYPERLIPIDEMIA 08/02/2006  . HYPERTENSION 08/05/2008  . Memory loss 07/21/2009  . Pilonidal cyst     low back pain   Medications-  Current Outpatient Prescriptions on File Prior to Visit  Medication Sig Dispense Refill  . aspirin EC 81 MG tablet 2 tabs po qd    . atorvastatin (LIPITOR) 80 MG tablet take one tablet by mouth daily for cholesterol 90 tablet 1  . Cholecalciferol (VITAMIN D-3) 5000 UNITS TABS Take 1 tablet by mouth daily.    Marland Kitchen esomeprazole (NEXIUM) 20 MG capsule Take 20 mg by mouth daily at 12 noon. Takes OTC 20 mg    . levothyroxine (SYNTHROID) 75 MCG tablet Take 1 tablet (75 mcg total) by mouth daily. 30 tablet 1  . lisinopril (PRINIVIL,ZESTRIL) 10 MG tablet Take one tablet by mouth one time daily. Onlt take when blood pressure is high. 90 tablet 1   . nitroGLYCERIN (NITROSTAT) 0.4 MG SL tablet Place 1 tablet (0.4 mg total) under the tongue every 5 (five) minutes as needed for chest pain. 25 tablet 3  . sildenafil (VIAGRA) 100 MG tablet Take 1 tablet (100 mg total) by mouth as needed for erectile dysfunction. 9 tablet 1   No current facility-administered medications on file prior to visit.   Allergies-  Allergies  Allergen Reactions  . Penicillins     unspecified   Physical Exam BP 128/60 mmHg  Pulse 90  Temp(Src) 98.2 F (36.8 C) (Temporal)  Resp 18  Ht 5' 10.5" (1.791 m)  Wt 219 lb (99.338 kg)  BMI 30.97 kg/m2  SpO2 98% Wt Readings from Last 3 Encounters:  03/03/14 219 lb (99.338 kg)  12/16/13 214 lb (97.07 kg)  10/30/13 210 lb (95.255 kg)  Vitals Reviewed. General Appearance: Well nourished, in no apparent distress and had pleasant demeanor. Obese. Eyes:  PERRLA. EOMI. Conjunctiva is pink without edema, erythema or yellowing. No scleral icterus. Sinuses: No Frontal/maxillary tenderness Ears: No erythema, edema or tenderness on both external ear cartilages and ear canals.  TMs are intact bilaterally with normal light reflexes and without erythema, edema or bulging. Nose: Nose is symmetrical and turbinates are pink and not erythematous, edematous or pale.    No polyps, tenderness or rhinorrhea. Throat:Oral pharynx is pink and moist. No erythema, edema  or tenderness in pharynx or posterior pharynx Mucosa is intact and without lesions. Tonsils are at +1 station bilaterally and do not have exudate.    Uvula is midline and not swollen. Teeth have cavities all over and erythema and edema in right lower jaw. Neck: Supple,  LAD, trachea is midline.  Full range of motion in neck intact Respiratory: CTAB,  r/r/w or stridor.  No increased effort of breathing. Cardio: RRR.   m/r/g.  S1S2nl.  Pulses B/L +2  Skin: Warm, dry, intact without rashes, lesions, ecchymosis, yellowing, cyanosis.  Neuro: Alert and oriented X3,  cooperative.  Mood and affect appropriate to situation.  CN II-XII grossly intact.  Psych: Insight and Judgment appropriate.   Assessment and Plan 1. Dental abscess - doxycycline (VIBRA-TABS) 100 MG tablet; Take 1 tablet (100 mg total) by mouth 2 (two) times daily.  Dispense: 20 tablet; Refill: 0 - HYDROcodone-acetaminophen (NORCO) 5-325 MG per tablet; Take 1 tablet by mouth every 6 (six) hours as needed for moderate pain.  Dispense: 30 tablet; Refill: 0 Please use Orajel OTC for pain.   Please go see dentist as soon as possible.  Discussed medication effects and SE's.  Pt agreed to treatment plan. If you are not feeling better in 10-14 days, then please call the office. Please keep your follow up appt on 03/19/14.  Lorenia Hoston, Stephani Police, PA-C 4:11 PM [facility]

## 2014-03-03 NOTE — Patient Instructions (Signed)
-  Take Doxycycline as prescribed with food. Take Norco as prescribed for pain -Take Oragel Over the counter for acute pain. Please see dentist as soon as possible.    If you are not feeling better in 10-14 days, then please call the office.  Dental Abscess A dental abscess is a collection of infected fluid (pus) from a bacterial infection in the inner part of the tooth (pulp). It usually occurs at the end of the tooth's root.  CAUSES   Severe tooth decay.  Trauma to the tooth that allows bacteria to enter into the pulp, such as a broken or chipped tooth. SYMPTOMS   Severe pain in and around the infected tooth.  Swelling and redness around the abscessed tooth or in the mouth or face.  Tenderness.  Pus drainage.  Bad breath.  Bitter taste in the mouth.  Difficulty swallowing.  Difficulty opening the mouth.  Nausea.  Vomiting.  Chills.  Swollen neck glands. DIAGNOSIS   A medical and dental history will be taken.  An examination will be performed by tapping on the abscessed tooth.  X-rays may be taken of the tooth to identify the abscess. TREATMENT The goal of treatment is to eliminate the infection. You may be prescribed antibiotic medicine to stop the infection from spreading. A root canal may be performed to save the tooth. If the tooth cannot be saved, it may be pulled (extracted) and the abscess may be drained.  HOME CARE INSTRUCTIONS  Only take over-the-counter or prescription medicines for pain, fever, or discomfort as directed by your caregiver.  Rinse your mouth (gargle) often with salt water ( tsp salt in 8 oz [250 ml] of warm water) to relieve pain or swelling.  Do not drive after taking pain medicine (narcotics).  Do not apply heat to the outside of your face.  Return to your dentist for further treatment as directed. SEEK MEDICAL CARE IF:  Your pain is not helped by medicine.  Your pain is getting worse instead of better. SEEK IMMEDIATE MEDICAL  CARE IF:  You have a fever or persistent symptoms for more than 2-3 days.  You have a fever and your symptoms suddenly get worse.  You have chills or a very bad headache.  You have problems breathing or swallowing.  You have trouble opening your mouth.  You have swelling in the neck or around the eye. Document Released: 01/24/2005 Document Revised: 10/19/2011 Document Reviewed: 05/04/2010 [facility] Patient Information 2015 [place], [place]. This information is not intended to replace advice given to you by your health care provider. Make sure you discuss any questions you have with your health care provider.

## 2014-03-18 ENCOUNTER — Other Ambulatory Visit: Payer: Self-pay | Admitting: Internal Medicine

## 2014-03-19 ENCOUNTER — Ambulatory Visit (INDEPENDENT_AMBULATORY_CARE_PROVIDER_SITE_OTHER): Payer: 59 | Admitting: Physician Assistant

## 2014-03-19 ENCOUNTER — Encounter: Payer: Self-pay | Admitting: Physician Assistant

## 2014-03-19 VITALS — BP 132/80 | HR 80 | Temp 97.7°F | Resp 16 | Ht 70.5 in | Wt 209.0 lb

## 2014-03-19 DIAGNOSIS — E039 Hypothyroidism, unspecified: Secondary | ICD-10-CM | POA: Insufficient documentation

## 2014-03-19 DIAGNOSIS — R7303 Prediabetes: Secondary | ICD-10-CM

## 2014-03-19 DIAGNOSIS — E782 Mixed hyperlipidemia: Secondary | ICD-10-CM

## 2014-03-19 DIAGNOSIS — R7309 Other abnormal glucose: Secondary | ICD-10-CM

## 2014-03-19 DIAGNOSIS — Z79899 Other long term (current) drug therapy: Secondary | ICD-10-CM

## 2014-03-19 DIAGNOSIS — I2583 Coronary atherosclerosis due to lipid rich plaque: Secondary | ICD-10-CM

## 2014-03-19 DIAGNOSIS — I2510 Atherosclerotic heart disease of native coronary artery without angina pectoris: Secondary | ICD-10-CM

## 2014-03-19 DIAGNOSIS — E559 Vitamin D deficiency, unspecified: Secondary | ICD-10-CM

## 2014-03-19 DIAGNOSIS — I10 Essential (primary) hypertension: Secondary | ICD-10-CM

## 2014-03-19 HISTORY — DX: Hypothyroidism, unspecified: E03.9

## 2014-03-19 MED ORDER — LEVOTHYROXINE SODIUM 75 MCG PO TABS: 75.0000 ug | ORAL_TABLET | Freq: Every day | ORAL | Status: DC

## 2014-03-19 NOTE — Progress Notes (Signed)
Assessment and Plan:  Hypertension: Continue medication, monitor blood pressure at home. Continue DASH diet.  Reminder to go to the ER if any CP, SOB, nausea, dizziness, severe HA, changes vision/speech, left arm numbness and tingling, and jaw pain. Cholesterol: Continue diet and exercise. Check cholesterol.  Pre-diabetes-Continue diet and exercise. Check A1C Vitamin D Def- check level and continue medications.  Hypothyroidism-check TSH level, continue medications the same, reminded to take on an empty stomach 30-79mins before food.  Obesity with co morbidities- long discussion about weight loss, diet, and exercise ASHD-Control blood pressure, cholesterol, glucose, increase exercise.   Continue diet and meds as discussed. Further disposition pending results of labs.  HPI 58 y.o. male  presents for 3 month follow up with hypertension, hyperlipidemia, prediabetes and vitamin D.  His blood pressure has been controlled at home, today their BP is BP: 132/80 mmHg  He does not workout. He denies chest pain, shortness of breath, dizziness. He has a history of ASHD with a stent in 2005, negative cardiolite 2015, follows Dr. Harrington Challenger. He has NTG but has not used it.    He is on cholesterol medication, lipitor but has not been on it in 30 days.  His cholesterol is not at goal less than 70. The cholesterol last visit was:   Lab Results  Component Value Date   CHOL 210* 12/16/2013   HDL 48 12/16/2013   LDLCALC 118* 12/16/2013   LDLDIRECT 190.6 07/30/2010   TRIG 218* 12/16/2013   CHOLHDL 4.4 12/16/2013   He has been working on diet and exercise for prediabetes, and denies paresthesia of the feet, polydipsia, polyuria and visual disturbances. Last A1C in the office was:  Lab Results  Component Value Date   HGBA1C 6.1* 12/16/2013  Patient is on Vitamin D supplement.   Lab Results  Component Value Date   VD25OH 14 12/16/2013   He is on thyroid medication, new start last visit, but has been out of it for  3 weeks.  Lab Results  Component Value Date   TSH 5.598* 12/16/2013  .   Current Medications:  Current Outpatient Prescriptions on File Prior to Visit  Medication Sig Dispense Refill  . aspirin EC 81 MG tablet 2 tabs po qd    . atorvastatin (LIPITOR) 80 MG tablet take one tablet by mouth daily for cholesterol 90 tablet 1  . Cholecalciferol (VITAMIN D-3) 5000 UNITS TABS Take 1 tablet by mouth daily.    Marland Kitchen doxycycline (VIBRA-TABS) 100 MG tablet Take 1 tablet (100 mg total) by mouth 2 (two) times daily. 20 tablet 0  . esomeprazole (NEXIUM) 20 MG capsule Take 20 mg by mouth daily at 12 noon. Takes OTC 20 mg    . HYDROcodone-acetaminophen (NORCO) 5-325 MG per tablet Take 1 tablet by mouth every 6 (six) hours as needed for moderate pain. 30 tablet 0  . levothyroxine (SYNTHROID) 75 MCG tablet Take 1 tablet (75 mcg total) by mouth daily. 30 tablet 1  . lisinopril (PRINIVIL,ZESTRIL) 10 MG tablet TAKE ONE TABLET BY MOUTH ONE TIME DAILY. only take when blood pressure is high 90 tablet 1  . nitroGLYCERIN (NITROSTAT) 0.4 MG SL tablet Place 1 tablet (0.4 mg total) under the tongue every 5 (five) minutes as needed for chest pain. 25 tablet 3  . sildenafil (VIAGRA) 100 MG tablet Take 1 tablet (100 mg total) by mouth as needed for erectile dysfunction. 9 tablet 1   No current facility-administered medications on file prior to visit.   Medical History:  Past Medical History  Diagnosis Date  . ANXIETY STATE NEC 11/01/2006  . ANXIETY 12/25/2007  . CORONARY ARTERY DISEASE 08/02/2006    off Plavix x 3 years  . DIVERTICULITIS OF COLON 01/06/2010  . DVT, HX OF 08/02/2006  . 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 HYPERLIPIDEMIA 08/02/2006  . HYPERTENSION 08/05/2008  . Memory loss 07/21/2009  . Pilonidal cyst     low back pain   Allergies:  Allergies  Allergen Reactions  . Penicillins     unspecified     Review of Systems:  Review of Systems  Constitutional: Negative.   HENT: Negative.   Eyes: Negative.    Respiratory: Negative.   Cardiovascular: Negative.   Gastrointestinal: Negative.   Genitourinary: Negative.   Musculoskeletal: Negative.   Skin: Negative.   Neurological: Negative.   Endo/Heme/Allergies: Negative.   Psychiatric/Behavioral: Negative.     Family history- Review and unchanged Social history- Review and unchanged Physical Exam: BP 132/80 mmHg  Pulse 80  Temp(Src) 97.7 F (36.5 C)  Resp 16  Ht 5' 10.5" (1.791 m)  Wt 209 lb (94.802 kg)  BMI 29.55 kg/m2 Wt Readings from Last 3 Encounters:  03/19/14 209 lb (94.802 kg)  03/03/14 219 lb (99.338 kg)  12/16/13 214 lb (97.07 kg)   General Appearance: Well nourished, in no apparent distress. Eyes: PERRLA, EOMs, conjunctiva no swelling or erythema Sinuses: No Frontal/maxillary tenderness ENT/Mouth: Ext aud canals clear, TMs without erythema, bulging. No erythema, swelling, or exudate on post pharynx.  Tonsils not swollen or erythematous. Hearing normal.  Neck: Supple, thyroid normal.  Respiratory: Respiratory effort normal, BS equal bilaterally without rales, rhonchi, wheezing or stridor.  Cardio: RRR with no MRGs. Brisk peripheral pulses without edema.  Abdomen: Soft, + BS,  Non tender, no guarding, rebound, hernias, masses. Lymphatics: Non tender without lymphadenopathy.  Musculoskeletal: Full ROM, 5/5 strength, Normal gait Skin: Warm, dry without rashes, lesions, ecchymosis.  Neuro: Cranial nerves intact. Normal muscle tone, no cerebellar symptoms. Psych: Awake and oriented X 3, normal affect, Insight and Judgment appropriate.    Vicie Mutters, PA-C 4:39 PM [facility]

## 2014-03-19 NOTE — Patient Instructions (Signed)
Hypothyroidism The thyroid is a large gland located in the lower front of your neck. The thyroid gland helps control metabolism. Metabolism is how your body handles food. It controls metabolism with the hormone thyroxine. When this gland is underactive (hypothyroid), it produces too little hormone.  CAUSES These include:   Absence or destruction of thyroid tissue.  Goiter due to iodine deficiency.  Goiter due to medications.  Congenital defects (since birth).  Problems with the pituitary. This causes a lack of TSH (thyroid stimulating hormone). This hormone tells the thyroid to turn out more hormone. SYMPTOMS  Lethargy (feeling as though you have no energy)  Cold intolerance  Weight gain (in spite of normal food intake)  Dry skin  Coarse hair  Menstrual irregularity (if severe, may lead to infertility)  Slowing of thought processes Cardiac problems are also caused by insufficient amounts of thyroid hormone. Hypothyroidism in the newborn is cretinism, and is an extreme form. It is important that this form be treated adequately and immediately or it will lead rapidly to retarded physical and mental development. DIAGNOSIS  To prove hypothyroidism, your caregiver may do blood tests and ultrasound tests. Sometimes the signs are hidden. It may be necessary for your caregiver to watch this illness with blood tests either before or after diagnosis and treatment. TREATMENT  Low levels of thyroid hormone are increased by using synthetic thyroid hormone. This is a safe, effective treatment. It usually takes about four weeks to gain the full effects of the medication. After you have the full effect of the medication, it will generally take another four weeks for problems to leave. Your caregiver may start you on low doses. If you have had heart problems the dose may be gradually increased. It is generally not an emergency to get rapidly to normal. HOME CARE INSTRUCTIONS   Take your  medications as your caregiver suggests. Let your caregiver know of any medications you are taking or start taking. Your caregiver will help you with dosage schedules.  As your condition improves, your dosage needs may increase. It will be necessary to have continuing blood tests as suggested by your caregiver.  Report all suspected medication side effects to your caregiver. SEEK MEDICAL CARE IF: Seek medical care if you develop:  Sweating.  Tremulousness (tremors).  Anxiety.  Rapid weight loss.  Heat intolerance.  Emotional swings.  Diarrhea.  Weakness. SEEK IMMEDIATE MEDICAL CARE IF:  You develop chest pain, an irregular heart beat (palpitations), or a rapid heart beat. MAKE SURE YOU:   Understand these instructions.  Will watch your condition.  Will get help right away if you are not doing well or get worse. Document Released: 01/24/2005 Document Revised: 04/18/2011 Document Reviewed: 09/14/2007 [facility] Patient Information 2015 [place], [place]. This information is not intended to replace advice given to you by your health care provider. Make sure you discuss any questions you have with your health care provider. Before you even begin to attack a weight-loss plan, it pays to remember this: You are not fat. You have fat. Losing weight isn't about blame or shame; it's simply another achievement to accomplish. Dieting is like any other skill-you have to buckle down and work at it. As long as you act in a smart, reasonable way, you'll ultimately get where you want to be. Here are some weight loss pearls for you.  1. It's Not a Diet. It's a Lifestyle Thinking of a diet as something you're on and suffering through only for the short term doesn't  work. To shed weight and keep it off, you need to make permanent changes to the way you eat. It's OK to indulge occasionally, of course, but if you cut calories temporarily and then revert to your old way of eating, you'll gain back the weight  quicker than you can say yo-yo. Use it to lose it. Research shows that one of the best predictors of long-term weight loss is how many pounds you drop in the first month. For that reason, nutritionists often suggest being stricter for the first two weeks of your new eating strategy to build momentum. Cut out added sugar and alcohol and avoid unrefined carbs. After that, figure out how you can reincorporate them in a way that's healthy and maintainable.  2. There's a Right Way to Exercise Working out burns calories and fat and boosts your metabolism by building muscle. But those trying to lose weight are notorious for overestimating the number of calories they burn and underestimating the amount they take in. Unfortunately, your system is biologically programmed to hold on to extra pounds and that means when you start exercising, your body senses the deficit and ramps up its hunger signals. If you're not diligent, you'll eat everything you burn and then some. Use it to lose it. Cardio gets all the exercise glory, but strength and interval training are the real heroes. They help you build lean muscle, which in turn increases your metabolism and calorie-burning ability 3. Don't Overreact to Mild Hunger Some people have a hard time losing weight because of hunger anxiety. To them, being hungry is bad-something to be avoided at all costs-so they carry snacks with them and eat when they don't need to. Others eat because they're stressed out or bored. While you never want to get to the point of being ravenous (that's when bingeing is likely to happen), a hunger pang, a craving, or the fact that it's 3:00 p.m. should not send you racing for the vending machine or obsessing about the energy bar in your purse. Ideally, you should put off eating until your stomach is growling and it's difficult to concentrate.  Use it to lose it. When you feel the urge to eat, use the HALT method. Ask yourself, Am I really hungry? Or am  I angry or anxious, lonely or bored, or tired? If you're still not certain, try the apple test. If you're truly hungry, an apple should seem delicious; if it doesn't, something else is going on. Or you can try drinking water and making yourself busy, if you are still hungry try a healthy snack.  4. Not All Calories Are Created Equal The mechanics of weight loss are pretty simple: Take in fewer calories than you use for energy. But the kind of food you eat makes all the difference. Processed food that's high in saturated fat and refined starch or sugar can cause inflammation that disrupts the hormone signals that tell your brain you're full. The result: You eat a lot more.  Use it to lose it. Clean up your diet. Swap in whole, unprocessed foods, including vegetables, lean protein, and healthy fats that will fill you up and give you the biggest nutritional bang for your calorie buck. In a few weeks, as your brain starts receiving regular hunger and fullness signals once again, you'll notice that you feel less hungry overall and naturally start cutting back on the amount you eat.  5. Protein, Produce, and Plant-Based Fats Are Your Weight-Loss Trinity Here's why eating the three  Ps regularly will help you drop pounds. Protein fills you up. You need it to build lean muscle, which keeps your metabolism humming so that you can torch more fat. People in a weight-loss program who ate double the recommended daily allowance for protein (about 110 grams for a 150-pound woman) lost 70 percent of their weight from fat, while people who ate the RDA lost only about 40 percent, one study found. Produce is packed with filling fiber. "It's very difficult to consume too many calories if you're eating a lot of vegetables. Example: Three cups of broccoli is a lot of food, yet only 93 calories. (Fruit is another story. It can be easy to overeat and can contain a lot of calories from sugar, so be sure to monitor your  intake.) Plant-based fats like olive oil and those in avocados and nuts are healthy and extra satiating.  Use it to lose it. Aim to incorporate each of the three Ps into every meal and snack. People who eat protein throughout the day are able to keep weight off, according to a study in the [place] of Clinical Nutrition. In addition to meat, poultry and seafood, good sources are beans, lentils, eggs, tofu, and yogurt. As for fat, keep portion sizes in check by measuring out salad dressing, oil, and nut butters (shoot for one to two tablespoons). Finally, eat veggies or a little fruit at every meal. People who did that consumed 308 fewer calories but didn't feel any hungrier than when they didn't eat more produce.  7. How You Eat Is As Important As What You Eat In order for your brain to register that you're full, you need to focus on what you're eating. Sit down whenever you eat, preferably at a table. Turn off the TV or computer, put down your phone, and look at your food. Smell it. Chew slowly, and don't put another bite on your fork until you swallow. When women ate lunch this attentively, they consumed 30 percent less when snacking later than those who listened to an audiobook at lunchtime, according to a study in the [place] of Nutrition. 8. Weighing Yourself Really Works The scale provides the best evidence about whether your efforts are paying off. Seeing the numbers tick up or down or stagnate is motivation to keep going-or to rethink your approach. A 2015 study at [facility] found that daily weigh-ins helped people lose more weight, keep it off, and maintain that loss, even after two years. Use it to lose it. Step on the scale at the same time every day for the best results. If your weight shoots up several pounds from one weigh-in to the next, don't freak out. Eating a lot of salt the night before or having your period is the likely culprit. The number should return to normal  in a day or two. It's a steady climb that you need to do something about. 9. Too Much Stress and Too Little Sleep Are Your Enemies When you're tired and frazzled, your body cranks up the production of cortisol, the stress hormone that can cause carb cravings. Not getting enough sleep also boosts your levels of ghrelin, a hormone associated with hunger, while suppressing leptin, a hormone that signals fullness and satiety. People on a diet who slept only five and a half hours a night for two weeks lost 55 percent less fat and were hungrier than those who slept eight and a half hours, according to a study in the [employer] Journal. Use it to lose it. Prioritize sleep, aiming for seven hours or more a night, which research shows helps lower stress. And make sure you're getting quality zzz's. If a snoring spouse or a fidgety cat wakes you up frequently throughout the night, you may end up getting the equivalent of just four hours of sleep, according to a study from [facility]. Keep pets out of the bedroom, and use a white-noise app to drown out snoring. 10. You Will Hit a plateau-And You Can Bust Through It As you slim down, your body releases much less leptin, the fullness hormone.  If you're not strength training, start right now. Building muscle can raise your metabolism to help you overcome a plateau. To keep your body challenged and burning calories, incorporate new moves and more intense intervals into your workouts or add another sweat session to your weekly routine. Alternatively, cut an extra 100 calories or so a day from your diet. Now that you've lost weight, your body simply doesn't need as much fuel.

## 2014-03-20 LAB — CBC WITH DIFFERENTIAL/PLATELET
BASOS ABS: 0 10*3/uL (ref 0.0–0.1)
Basophils Relative: 0 % (ref 0–1)
EOS ABS: 0.2 10*3/uL (ref 0.0–0.7)
EOS PCT: 3 % (ref 0–5)
HCT: 41.9 % (ref 39.0–52.0)
Hemoglobin: 14 g/dL (ref 13.0–17.0)
LYMPHS PCT: 26 % (ref 12–46)
Lymphs Abs: 2 10*3/uL (ref 0.7–4.0)
MCH: 29.4 pg (ref 26.0–34.0)
MCHC: 33.4 g/dL (ref 30.0–36.0)
MCV: 88 fL (ref 78.0–100.0)
MONO ABS: 0.9 10*3/uL (ref 0.1–1.0)
MPV: 11 fL (ref 8.6–12.4)
Monocytes Relative: 11 % (ref 3–12)
Neutro Abs: 4.7 10*3/uL (ref 1.7–7.7)
Neutrophils Relative %: 60 % (ref 43–77)
PLATELETS: 373 10*3/uL (ref 150–400)
RBC: 4.76 MIL/uL (ref 4.22–5.81)
RDW: 14.8 % (ref 11.5–15.5)
WBC: 7.8 10*3/uL (ref 4.0–10.5)

## 2014-03-20 LAB — BASIC METABOLIC PANEL WITH GFR
BUN: 20 mg/dL (ref 6–23)
CHLORIDE: 106 meq/L (ref 96–112)
CO2: 27 meq/L (ref 19–32)
CREATININE: 1.16 mg/dL (ref 0.50–1.35)
Calcium: 9.3 mg/dL (ref 8.4–10.5)
GFR, EST NON AFRICAN AMERICAN: 68 mL/min
GFR, Est African American: 79 mL/min
Glucose, Bld: 109 mg/dL — ABNORMAL HIGH (ref 70–99)
Potassium: 4.4 mEq/L (ref 3.5–5.3)
Sodium: 140 mEq/L (ref 135–145)

## 2014-03-20 LAB — LIPID PANEL
Cholesterol: 222 mg/dL — ABNORMAL HIGH (ref 0–200)
HDL: 45 mg/dL (ref >39)
LDL CALC: 140 mg/dL — AB (ref 0–99)
TRIGLYCERIDES: 187 mg/dL — AB (ref <150)
Total CHOL/HDL Ratio: 4.9 Ratio
VLDL: 37 mg/dL (ref 0–40)

## 2014-03-20 LAB — HEPATIC FUNCTION PANEL
ALBUMIN: 4.3 g/dL (ref 3.5–5.2)
ALT: 22 U/L (ref 0–53)
AST: 20 U/L (ref 0–37)
Alkaline Phosphatase: 55 U/L (ref 39–117)
BILIRUBIN TOTAL: 0.4 mg/dL (ref 0.2–1.2)
Bilirubin, Direct: 0.1 mg/dL (ref 0.0–0.3)
Indirect Bilirubin: 0.3 mg/dL (ref 0.2–1.2)
Total Protein: 6.4 g/dL (ref 6.0–8.3)

## 2014-03-20 LAB — MAGNESIUM: MAGNESIUM: 2.1 mg/dL (ref 1.5–2.5)

## 2014-03-20 LAB — VITAMIN D 25 HYDROXY (VIT D DEFICIENCY, FRACTURES): Vit D, 25-Hydroxy: 30 ng/mL (ref 30–100)

## 2014-03-20 LAB — HEMOGLOBIN A1C
Hgb A1c MFr Bld: 5.9 % — ABNORMAL HIGH (ref <5.7)
MEAN PLASMA GLUCOSE: 123 mg/dL — AB (ref <117)

## 2014-03-20 LAB — TSH: TSH: 1.158 u[IU]/mL (ref 0.350–4.500)

## 2014-03-20 LAB — INSULIN, FASTING: INSULIN FASTING, SERUM: 26.9 u[IU]/mL — AB (ref 2.0–19.6)

## 2014-04-15 DIAGNOSIS — E782 Mixed hyperlipidemia: Secondary | ICD-10-CM

## 2014-04-15 DIAGNOSIS — E039 Hypothyroidism, unspecified: Secondary | ICD-10-CM

## 2014-04-16 ENCOUNTER — Other Ambulatory Visit: Payer: 59

## 2014-04-16 DIAGNOSIS — E039 Hypothyroidism, unspecified: Secondary | ICD-10-CM

## 2014-04-16 DIAGNOSIS — N289 Disorder of kidney and ureter, unspecified: Secondary | ICD-10-CM

## 2014-04-16 DIAGNOSIS — E782 Mixed hyperlipidemia: Secondary | ICD-10-CM

## 2014-04-16 LAB — LIPID PANEL
CHOL/HDL RATIO: 3.2 ratio
Cholesterol: 150 mg/dL (ref 0–200)
HDL: 47 mg/dL (ref >=40)
LDL Cholesterol: 84 mg/dL (ref 0–99)
TRIGLYCERIDES: 95 mg/dL (ref <150)
VLDL: 19 mg/dL (ref 0–40)

## 2014-04-16 LAB — BASIC METABOLIC PANEL WITH GFR
BUN: 15 mg/dL (ref 6–23)
CALCIUM: 9.7 mg/dL (ref 8.4–10.5)
CO2: 23 mEq/L (ref 19–32)
Chloride: 102 mEq/L (ref 96–112)
Creat: 1.23 mg/dL (ref 0.50–1.35)
GFR, Est African American: 73 mL/min
GFR, Est Non African American: 63 mL/min
GLUCOSE: 98 mg/dL (ref 70–99)
Potassium: 4.5 mEq/L (ref 3.5–5.3)
SODIUM: 139 meq/L (ref 135–145)

## 2014-04-16 LAB — HEPATIC FUNCTION PANEL
ALBUMIN: 4 g/dL (ref 3.5–5.2)
ALT: 26 U/L (ref 0–53)
AST: 23 U/L (ref 0–37)
Alkaline Phosphatase: 65 U/L (ref 39–117)
Bilirubin, Direct: 0.1 mg/dL (ref 0.0–0.3)
Indirect Bilirubin: 0.4 mg/dL (ref 0.2–1.2)
TOTAL PROTEIN: 6.5 g/dL (ref 6.0–8.3)
Total Bilirubin: 0.5 mg/dL (ref 0.2–1.2)

## 2014-04-16 LAB — TSH
TSH: 1.464 u[IU]/mL (ref 0.350–4.500)
TSH: 1.418 u[IU]/mL (ref 0.350–4.500)

## 2014-07-30 ENCOUNTER — Ambulatory Visit (INDEPENDENT_AMBULATORY_CARE_PROVIDER_SITE_OTHER): Payer: 59 | Admitting: Internal Medicine

## 2014-07-30 ENCOUNTER — Encounter: Payer: Self-pay | Admitting: Internal Medicine

## 2014-07-30 VITALS — BP 136/90 | HR 88 | Temp 97.5°F | Resp 16 | Ht 70.5 in | Wt 206.6 lb

## 2014-07-30 DIAGNOSIS — I10 Essential (primary) hypertension: Secondary | ICD-10-CM

## 2014-07-30 DIAGNOSIS — E559 Vitamin D deficiency, unspecified: Secondary | ICD-10-CM

## 2014-07-30 DIAGNOSIS — I25709 Atherosclerosis of coronary artery bypass graft(s), unspecified, with unspecified angina pectoris: Secondary | ICD-10-CM

## 2014-07-30 DIAGNOSIS — E039 Hypothyroidism, unspecified: Secondary | ICD-10-CM

## 2014-07-30 DIAGNOSIS — E782 Mixed hyperlipidemia: Secondary | ICD-10-CM

## 2014-07-30 DIAGNOSIS — Z1212 Encounter for screening for malignant neoplasm of rectum: Secondary | ICD-10-CM

## 2014-07-30 DIAGNOSIS — K219 Gastro-esophageal reflux disease without esophagitis: Secondary | ICD-10-CM

## 2014-07-30 DIAGNOSIS — Z6829 Body mass index (BMI) 29.0-29.9, adult: Secondary | ICD-10-CM

## 2014-07-30 DIAGNOSIS — K222 Esophageal obstruction: Secondary | ICD-10-CM

## 2014-07-30 DIAGNOSIS — R7309 Other abnormal glucose: Secondary | ICD-10-CM

## 2014-07-30 DIAGNOSIS — R5383 Other fatigue: Secondary | ICD-10-CM

## 2014-07-30 DIAGNOSIS — Z125 Encounter for screening for malignant neoplasm of prostate: Secondary | ICD-10-CM

## 2014-07-30 DIAGNOSIS — Z111 Encounter for screening for respiratory tuberculosis: Secondary | ICD-10-CM

## 2014-07-30 DIAGNOSIS — R7303 Prediabetes: Secondary | ICD-10-CM

## 2014-07-30 DIAGNOSIS — Z79899 Other long term (current) drug therapy: Secondary | ICD-10-CM

## 2014-07-30 MED ORDER — ALPRAZOLAM 0.5 MG PO TABS: ORAL_TABLET | ORAL | Status: DC

## 2014-07-30 NOTE — Patient Instructions (Signed)

## 2014-07-30 NOTE — Progress Notes (Signed)
Patient ID: [name], male   DOB: 1955/02/27, 59 y.o.   MRN: [id number]   Annual Comprehensive Examination  This very nice 59 y.o. MWM presents for complete physical.  Patient has been followed for HTN, ASCAD,  Prediabetes, Hyperlipidemia,Hypothyroidism  and Vitamin D Deficiency. Patient also relates being under a great deal of stress with a 63 yo son with drug & unemployment issues and also some inter marital discord and requests R Xanax. He recounts intolerances to [facility] and Bupropion in the past.    HTN predates since 2010. Patient's BP has been controlled at home.  Today's BP: 136/90 mmHg. In 2005 he had PTCA- Stent and in 2011 had a negative Cardiolite by Dr Dorris Carnes.  Patient denies any cardiac symptoms as chest pain, palpitations, shortness of breath, dizziness or ankle swelling. Patient exercise sporadically.    Patient's hyperlipidemia is controlled with diet and medications, but patient relates today that he stopped his Atorvastatin as he desires not to take medicines. Patient denies myalgias or other medication SE's when on meds. Last lipids on meds were at goal - Chol 150; HDL 47; LDL 84; Trig 95 on 04/16/2014.   Patient has prediabetes since Dec 2014 with A1c 6.1%  Ans A1c was 6.3% in Mar 2015  and patient denies reactive hypoglycemic symptoms, visual blurring, diabetic polys or paresthesias. Last A1c was 5.9% on 03/19/2014.      Finally, patient has history of Vitamin D Deficiency of 106 in June 2014 and last vitamin D was 30 on 03/19/2014.  Medication Sig  . aspirin EC 81 MG tablet 2 tabs po qd  . atorvastatin (LIPITOR) 80 MG tablet take one tablet by mouth daily for cholesterol  . Cholecalciferol (VITAMIN D-3) 5000 UNITS TABS Take 1 tablet by mouth daily.  Marland Kitchen esomeprazole (NEXIUM) 20 MG capsule Take 20 mg by mouth daily at 12 noon. Takes OTC 20 mg  . HYDROcodone-acetaminophen (NORCO) 5-325 MG per tablet Take 1 tablet by mouth every 6 (six) hours as needed for moderate pain.  Marland Kitchen  levothyroxine (SYNTHROID) 75 MCG tablet Take 1 tablet (75 mcg total) by mouth daily.  Marland Kitchen lisinopril (PRINIVIL,ZESTRIL) 10 MG tablet TAKE ONE TABLET BY MOUTH ONE TIME DAILY. only take when blood pressure is high  . nitroGLYCERIN (NITROSTAT) 0.4 MG SL tablet Place 1 tablet (0.4 mg total) under the tongue every 5 (five) minutes as needed for chest pain.  . sildenafil (VIAGRA) 100 MG tablet Take 1 tablet (100 mg total) by mouth as needed for erectile dysfunction.   Allergies  Allergen Reactions  . Penicillins     unspecified   Past Medical History  Diagnosis Date  . ANXIETY STATE NEC 11/01/2006  . ANXIETY 12/25/2007  . CORONARY ARTERY DISEASE 08/02/2006    off Plavix x 3 years  . DIVERTICULITIS OF COLON 01/06/2010  . DVT, HX OF 08/02/2006  . 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 HYPERLIPIDEMIA 08/02/2006  . HYPERTENSION 08/05/2008  . Memory loss 07/21/2009  . Pilonidal cyst     low back pain   Health Maintenance  Topic Date Due  . ZOSTAVAX  12/24/2013  . INFLUENZA VACCINE  09/08/2014  . COLONOSCOPY  06/15/2017  . TETANUS/TDAP  07/29/2020  . HIV Screening  Completed   Immunization History  Administered Date(s) Administered  . PPD Test 07/24/2013  . Tdap 07/30/2010   Past Surgical History  Procedure Laterality Date  . Coronary angioplasty with stent placement    . Pilonidal cyst excision  teenager  . Esophagogastroduodenoscopy  06/16/2010    antral ulcers, esophageal stricture - dilated, duodenitis, hiatal hernia  . Colonoscopy w/ polypectomy  06/16/2010    diminutive adenoma, diverticulosis, hemorrhoids   Family History  Problem Relation Age of Onset  . Colon cancer Father     deceased age 17  . Dementia Mother    History   Social History  . Marital Status: Married    Spouse Name: N/A  . Number of Children: 2  . Years of Education: N/A   Occupational History  . Secondary school teacher    Social History Main Topics  . Smoking status: Never Smoker   . Smokeless tobacco: Never  Used  . Alcohol Use: Yes     Comment: approx 2 drinks per week  . Drug Use: Yes     Comment: marjuana as per 2011 Echart H&P denies any at this time (06/2010  . Sexual Activity: infrequent   Social History Narrative   Daily caffeine    ROS Constitutional: Denies fever, chills, weight loss/gain, headaches, insomnia,  night sweats or change in appetite. Does c/o fatigue. Eyes: Denies redness, blurred vision, diplopia, discharge, itchy or watery eyes.  ENT: Denies discharge, congestion, post nasal drip, epistaxis, sore throat, earache, hearing loss, dental pain, Tinnitus, Vertigo, Sinus pain or snoring.  Cardio: Denies chest pain, palpitations, irregular heartbeat, syncope, dyspnea, diaphoresis, orthopnea, PND, claudication or edema Respiratory: denies cough, dyspnea, DOE, pleurisy, hoarseness, laryngitis or wheezing.  Gastrointestinal: Denies dysphagia, heartburn, reflux, water brash, pain, cramps, nausea, vomiting, bloating, diarrhea, constipation, hematemesis, melena, hematochezia, jaundice or hemorrhoids Genitourinary: Denies dysuria, frequency, urgency, nocturia, hesitancy, discharge, hematuria or flank pain Musculoskeletal: Denies arthralgia, myalgia, stiffness, Jt. Swelling, pain, limp or strain/sprain. Denies Falls. Skin: Denies puritis, rash, hives, warts, acne, eczema or change in skin lesion Neuro: No weakness, tremor, incoordination, spasms, paresthesia or pain Psychiatric: Denies confusion, memory loss or sensory loss. Denies Depression. Endocrine: Denies change in weight, skin, hair change, nocturia, and paresthesia, diabetic polys, visual blurring or hyper / hypo glycemic episodes.  Heme/Lymph: No excessive bleeding, bruising or enlarged lymph nodes.  Physical Exam  BP 136/90   Pulse 88  Temp 97.5 F   Resp 16  Ht 5' 10.5"  Wt 206 lb 9.6 oz    BMI 29.22   General Appearance: Well nourished, in no apparent distress. Eyes: PERRLA, EOMs, conjunctiva no swelling or  erythema, normal fundi and vessels. Sinuses: No frontal/maxillary tenderness ENT/Mouth: EACs patent / TMs  nl. Nares clear without erythema, swelling, mucoid exudates. Oral hygiene is good. No erythema, swelling, or exudate. Tongue normal, non-obstructing. Tonsils not swollen or erythematous. Hearing normal.  Neck: Supple, thyroid normal. No bruits, nodes or JVD. Respiratory: Respiratory effort normal.  BS equal and clear bilateral without rales, rhonci, wheezing or stridor. Cardio: Heart sounds are normal with regular rate and rhythm and no murmurs, rubs or gallops. Peripheral pulses are normal and equal bilaterally without edema. No aortic or femoral bruits. Chest: symmetric with normal excursions and percussion.  Abdomen: Flat, soft, with bowel sounds. Nontender, no guarding, rebound, hernias, masses, or organomegaly.  Lymphatics: Non tender without lymphadenopathy.  Genitourinary: No hernias.Testes nl. DRE - prostate nl for age - smooth & firm w/o nodules. Musculoskeletal: Full ROM all peripheral extremities, joint stability, 5/5 strength, and normal gait. Skin: Warm and dry without rashes, lesions, cyanosis, clubbing or  ecchymosis.  Neuro: Cranial nerves intact, reflexes equal bilaterally. Normal muscle tone, no cerebellar symptoms. Sensation intact.  Pysch: Awake and oriented X  3 with normal affect, insight and judgment appropriate. Denies suicidal ideations.   Assessment and Plan  1. Essential hypertension  - Microalbumin / creatinine urine ratio - EKG 12-Lead - [place], RETROPERITNL ABD,  LTD - TSH  2. Hyperlipidemia  - Lipid panel  3. Prediabetes  - Hemoglobin A1c - Insulin, random  4. Vitamin D deficiency  - Vit D  25 hydroxy   5. Hypothyroidism   6. GERD with stricture   7. ASCAD s/p PTCA/Stent    8. Screening for rectal cancer  - POC Hemoccult Bld/Stl   9. Prostate cancer screening  - PSA  10. Other fatigue  - Vitamin B12 - Testosterone - Iron and  TIBC  11. Medication management  - Urine Microscopic - BASIC METABOLIC PANEL WITH GFR - Hepatic function panel - Magnesium  12. Screening examination for pulmonary tuberculosis  - PPD  13. Chronic/ acute Anxiety   - Rx Xanax      Continue prudent diet as discussed, weight control, BP monitoring, regular exercise, and medications as discussed.  Discussed med effects and SE's. Routine screening labs and tests as requested with regular follow-up as recommended.  Over 40 minutes of exam, counseling &  chart review was performed

## 2014-07-31 LAB — HEPATIC FUNCTION PANEL
ALK PHOS: 58 U/L (ref 39–117)
ALT: 16 U/L (ref 0–53)
AST: 14 U/L (ref 0–37)
Albumin: 4.2 g/dL (ref 3.5–5.2)
Total Bilirubin: 0.3 mg/dL (ref 0.2–1.2)
Total Protein: 6.7 g/dL (ref 6.0–8.3)

## 2014-07-31 LAB — IRON AND TIBC
%SAT: 20 % (ref 20–55)
Iron: 77 ug/dL (ref 42–165)
TIBC: 394 ug/dL (ref 215–435)
UIBC: 317 ug/dL (ref 125–400)

## 2014-07-31 LAB — LIPID PANEL
CHOL/HDL RATIO: 5.4 ratio
CHOLESTEROL: 232 mg/dL — AB (ref 0–200)
HDL: 43 mg/dL (ref >=40)
LDL Cholesterol: 154 mg/dL — ABNORMAL HIGH (ref 0–99)
Triglycerides: 174 mg/dL — ABNORMAL HIGH (ref <150)
VLDL: 35 mg/dL (ref 0–40)

## 2014-07-31 LAB — HEMOGLOBIN A1C
HEMOGLOBIN A1C: 6 % — AB (ref <5.7)
MEAN PLASMA GLUCOSE: 126 mg/dL — AB (ref <117)

## 2014-07-31 LAB — BASIC METABOLIC PANEL WITH GFR
BUN: 18 mg/dL (ref 6–23)
CALCIUM: 9.8 mg/dL (ref 8.4–10.5)
CO2: 26 mEq/L (ref 19–32)
Chloride: 105 mEq/L (ref 96–112)
Creat: 1.26 mg/dL (ref 0.50–1.35)
GFR, EST NON AFRICAN AMERICAN: 62 mL/min
GFR, Est African American: 71 mL/min
Glucose, Bld: 87 mg/dL (ref 70–99)
Potassium: 4.9 mEq/L (ref 3.5–5.3)
SODIUM: 141 meq/L (ref 135–145)

## 2014-07-31 LAB — PSA: PSA: 1.98 ng/mL (ref <=4.00)

## 2014-07-31 LAB — MICROALBUMIN / CREATININE URINE RATIO: Creatinine, Urine: 51.6 mg/dL

## 2014-07-31 LAB — URINALYSIS, MICROSCOPIC ONLY
Bacteria, UA: NONE SEEN
Casts: NONE SEEN
Crystals: NONE SEEN
Squamous Epithelial / LPF: NONE SEEN

## 2014-07-31 LAB — VITAMIN B12: Vitamin B-12: 354 pg/mL (ref 211–911)

## 2014-07-31 LAB — VITAMIN D 25 HYDROXY (VIT D DEFICIENCY, FRACTURES): VIT D 25 HYDROXY: 57 ng/mL (ref 30–100)

## 2014-07-31 LAB — MAGNESIUM: Magnesium: 2 mg/dL (ref 1.5–2.5)

## 2014-07-31 LAB — TSH: TSH: 3.357 u[IU]/mL (ref 0.350–4.500)

## 2014-07-31 LAB — TESTOSTERONE: TESTOSTERONE: 417 ng/dL (ref 300–890)

## 2014-07-31 LAB — INSULIN, RANDOM: Insulin: 12.9 u[IU]/mL (ref 2.0–19.6)

## 2014-11-03 ENCOUNTER — Other Ambulatory Visit: Payer: Self-pay | Admitting: Internal Medicine

## 2014-11-14 ENCOUNTER — Ambulatory Visit: Payer: Self-pay | Admitting: Internal Medicine

## 2014-11-20 ENCOUNTER — Encounter: Payer: Self-pay | Admitting: Physician Assistant

## 2014-11-20 MED ORDER — ALPRAZOLAM 0.5 MG PO TABS: ORAL_TABLET | ORAL | Status: AC

## 2014-11-24 ENCOUNTER — Encounter: Payer: Self-pay | Admitting: Physician Assistant

## 2014-11-24 ENCOUNTER — Ambulatory Visit (INDEPENDENT_AMBULATORY_CARE_PROVIDER_SITE_OTHER): Payer: 59 | Admitting: Physician Assistant

## 2014-11-24 VITALS — BP 122/70 | HR 86 | Temp 98.4°F | Resp 18 | Ht 70.5 in | Wt 205.0 lb

## 2014-11-24 DIAGNOSIS — I25709 Atherosclerosis of coronary artery bypass graft(s), unspecified, with unspecified angina pectoris: Secondary | ICD-10-CM

## 2014-11-24 DIAGNOSIS — E782 Mixed hyperlipidemia: Secondary | ICD-10-CM | POA: Diagnosis not present

## 2014-11-24 DIAGNOSIS — I10 Essential (primary) hypertension: Secondary | ICD-10-CM

## 2014-11-24 DIAGNOSIS — R079 Chest pain, unspecified: Secondary | ICD-10-CM

## 2014-11-24 DIAGNOSIS — R7303 Prediabetes: Secondary | ICD-10-CM

## 2014-11-24 DIAGNOSIS — E039 Hypothyroidism, unspecified: Secondary | ICD-10-CM | POA: Diagnosis not present

## 2014-11-24 LAB — CBC WITH DIFFERENTIAL/PLATELET
BASOS ABS: 0 10*3/uL (ref 0.0–0.1)
BASOS PCT: 0 % (ref 0–1)
Eosinophils Absolute: 0.1 10*3/uL (ref 0.0–0.7)
Eosinophils Relative: 1 % (ref 0–5)
HCT: 42.8 % (ref 39.0–52.0)
HEMOGLOBIN: 14.5 g/dL (ref 13.0–17.0)
Lymphocytes Relative: 20 % (ref 12–46)
Lymphs Abs: 1.8 10*3/uL (ref 0.7–4.0)
MCH: 30.3 pg (ref 26.0–34.0)
MCHC: 33.9 g/dL (ref 30.0–36.0)
MCV: 89.5 fL (ref 78.0–100.0)
MONO ABS: 0.7 10*3/uL (ref 0.1–1.0)
MPV: 11.7 fL (ref 8.6–12.4)
Monocytes Relative: 8 % (ref 3–12)
NEUTROS ABS: 6.4 10*3/uL (ref 1.7–7.7)
Neutrophils Relative %: 71 % (ref 43–77)
Platelets: 307 10*3/uL (ref 150–400)
RBC: 4.78 MIL/uL (ref 4.22–5.81)
RDW: 13.9 % (ref 11.5–15.5)
WBC: 9 10*3/uL (ref 4.0–10.5)

## 2014-11-24 NOTE — Patient Instructions (Addendum)
Follow up Dr. Harrington Challenger Go to the ER if any worsening chest pain, shortness of breath.   Angina Pectoris Angina pectoris, often called angina, is extreme discomfort in the chest, neck, or arm. This is caused by a lack of blood in the middle and thickest layer of the heart wall (myocardium). There are four types of angina:  Stable angina. Stable angina usually occurs in episodes of predictable frequency and duration. It is usually brought on by physical activity, stress, or excitement. Stable angina usually lasts a few minutes and can often be relieved by a medicine that you place under your tongue. This medicine is called sublingual nitroglycerin.  Unstable angina. Unstable angina can occur even when you are doing little or no physical activity. It can even occur while you are sleeping or when you are at rest. It can suddenly increase in severity or frequency. It may not be relieved by sublingual nitroglycerin, and it can last up to 30 minutes.  Microvascular angina. This type of angina is caused by a disorder of tiny blood vessels called arterioles. Microvascular angina is more common in women. The pain may be more severe and last longer than other types of angina pectoris.  Prinzmetal or variant angina. This type of angina pectoris is rare and usually occurs when you are doing little or no physical activity. It especially occurs in the early morning hours. CAUSES Atherosclerosis is the cause of angina. This is the buildup of fat and cholesterol (plaque) on the inside of the arteries. Over time, the plaque may narrow or block the artery, and this will lessen blood flow to the heart. Plaque can also become weak and break off within a coronary artery to form a clot and cause a sudden blockage. RISK FACTORS Risk factors common to both men and women include:  High cholesterol levels.  High blood pressure (hypertension).  Tobacco use.  Diabetes.  Family history of angina.  Obesity.  Lack of  exercise.  A diet high in saturated fats. Women are at greater risk for angina if they are:  Over age 79.  Postmenopausal. SYMPTOMS Many people do not experience any symptoms during the early stages of angina. As the condition progresses, symptoms common to both men and women may include:  Chest pain.  The pain can be described as a crushing or squeezing in the chest, or a tightness, pressure, fullness, or heaviness in the chest.  The pain can last more than a few minutes, or it can stop and recur.  Pain in the arms, neck, jaw, or back.  Unexplained heartburn or indigestion.  Shortness of breath.  Nausea.  Sudden cold sweats.  Sudden light-headedness. Many women have chest discomfort and some of the other symptoms. However, women often have different (atypical) symptoms, such as:   Fatigue.  Unexplained feelings of nervousness or anxiety.  Unexplained weakness.  Dizziness or fainting. Sometimes, women may have angina without any symptoms. DIAGNOSIS  Tests to diagnose angina may include:  ECG (electrocardiogram).  Exercise stress test. This looks for signs of blockage when the heart is being exercised.  Pharmacologic stress test. This test looks for signs of blockage when the heart is being stressed with a medicine.  Blood tests.  Coronary angiogram. This is a procedure to look at the coronary arteries to see if there is any blockage. TREATMENT  The treatment of angina may include the following:  Healthy behavioral changes to reduce or control risk factors.  Medicine.  Coronary stenting.A stent helps to  keep an artery open.  Coronary angioplasty. This procedure widens a narrowed or blocked artery.  Coronary arterybypass surgery. This will allow your blood to pass the blockage (bypass) to reach your heart. HOME CARE INSTRUCTIONS   Take medicines only as directed by your health care provider.  Do not take the following medicines unless your health care  provider approves:  Nonsteroidal anti-inflammatory drugs (NSAIDs), such as ibuprofen, naproxen, or celecoxib.  Vitamin supplements that contain vitamin A, vitamin E, or both.  Hormone replacement therapy that contains estrogen with or without progestin.  Manage other health conditions such as hypertension and diabetes as directed by your health care provider.  Follow a heart-healthy diet. A dietitian can help to educate you about healthy food options and changes.  Use healthy cooking methods such as roasting, grilling, broiling, baking, poaching, steaming, or stir-frying. Talk to a dietitian to learn more about healthy cooking methods.  Follow an exercise program approved by your health care provider.  Maintain a healthy weight. Lose weight as approved by your health care provider.  Plan rest periods when fatigued.  Learn to manage stress.  Do not use any tobacco products, including cigarettes, chewing tobacco, or electronic cigarettes. If you need help quitting, ask your health care provider.  If you drink alcohol, and your health care provider approves, limit your alcohol intake to no more than 1 drink per day. One drink equals 12 ounces of beer, 5 ounces of wine, or 1 ounces of hard liquor.  Stop illegal drug use.  Keep all follow-up visits as directed by your health care provider. This is important. SEEK IMMEDIATE MEDICAL CARE IF:   You have pain in your chest, neck, arm, jaw, stomach, or back that lasts more than a few minutes, is recurring, or is unrelieved by taking sublingualnitroglycerin.  You have profuse sweating without cause.  You have unexplained:  Heartburn or indigestion.  Shortness of breath or difficulty breathing.  Nausea or vomiting.  Fatigue.  Feelings of nervousness or anxiety.  Weakness.  Diarrhea.  You have sudden light-headedness or dizziness.  You faint. These symptoms may represent a serious problem that is an emergency. Do not wait  to see if the symptoms will go away. Get medical help right away. Call your local emergency services (911 in the [place] Do not drive yourself to the hospital.   This information is not intended to replace advice given to you by your health care provider. Make sure you discuss any questions you have with your health care provider.   Document Released: 01/24/2005 Document Revised: 02/14/2014 Document Reviewed: 05/28/2013 Elsevier Interactive Patient Education [employer].

## 2014-11-24 NOTE — Progress Notes (Signed)
Assessment and Plan:  1. Hypertension -Continue medication, monitor blood pressure at home. Continue DASH diet.  Reminder to go to the ER if any CP, SOB, nausea, dizziness, severe HA, changes vision/speech, left arm numbness and tingling and jaw pain.  2. Cholesterol -Continue diet and exercise. Check cholesterol.   3. Prediabetes  -Continue diet and exercise. Check A1C  4. Vitamin D Def - check level and continue medications.   5. Hypothyroidism -check TSH level, has been off meds, reminded to take on an empty stomach 30-1mins before food.   6. ASHD with possible anginal symptoms Control blood pressure, cholesterol, glucose, increase exercise. Check labs EKG shows IRBBB, no ST CHANGES, normal rate Will medically treat, concerning for angina, Get back on lipitor, stay on bASA 2 a day, pick up NTG, suggest follow up with cardio ASAP, go to ER if any worsening symptoms. .    Continue diet and meds as discussed. Further disposition pending results of labs. Over 30 minutes of exam, counseling, chart review, and critical decision making was performed  HPI 59 y.o. male  presents for 3 month follow up on hypertension, cholesterol, ASHD, prediabetes, and vitamin D deficiency.   His blood pressure has been controlled at home, today their BP is BP: 122/70 mmHg  He does not workout. He has a history of ASHD with a stent in 2005, negative cardiolite 2015, follows with Dr. Harrington Challenger.  He complains of chest pain for several days, states it feels similar to chest pain he had in 2005. Subtle left sided tooth ache, no radiation, no SOB, palpitation, nausea but has been very fatigued for 1 week. Nothing makes it worse, nothing makes it better, does not have NTG, never picked it up. He is on nexium for GERD. Denies leg swelling, pain. He is on 2-3 bASA daily, he is not on lipitor. Has not taken viagra in 1+ years. He has been under a lot of stress.   He is suppose to be on cholesterol medication but has not  been taking. His cholesterol is not at goal. The cholesterol last visit was:   Lab Results  Component Value Date   CHOL 232* 07/30/2014   HDL 43 07/30/2014   LDLCALC 154* 07/30/2014   LDLDIRECT 190.6 07/30/2010   TRIG 174* 07/30/2014   CHOLHDL 5.4 07/30/2014    He has been working on diet and exercise for prediabetes, and denies paresthesia of the feet, polydipsia, polyuria and visual disturbances. Last A1C in the office was:  Lab Results  Component Value Date   HGBA1C 6.0* 07/30/2014   Patient is on Vitamin D supplement.   Lab Results  Component Value Date   VD25OH 60 07/30/2014     He is on thyroid medication. His medication was not changed last visit but he has stopped it for 6-8 months   Lab Results  Component Value Date   TSH 3.357 07/30/2014  .   Current Medications:  Current Outpatient Prescriptions on File Prior to Visit  Medication Sig Dispense Refill  . ALPRAZolam (XANAX) 0.5 MG tablet Take 1/2 to 1 tablet 2 or 3 x day if needed for anxiety 90 tablet 2  . aspirin EC 81 MG tablet 2 tabs po qd    . Cholecalciferol (VITAMIN D-3) 5000 UNITS TABS Take 1 tablet by mouth daily.    Marland Kitchen esomeprazole (NEXIUM) 20 MG capsule Take 20 mg by mouth daily at 12 noon. Takes OTC 20 mg    . lisinopril (PRINIVIL,ZESTRIL) 10 MG tablet TAKE  ONE TABLET BY MOUTH ONE TIME DAILY. ONLY TAKE WHEN BLOOD PRESSURE IS HIGH. 90 tablet 1  . atorvastatin (LIPITOR) 80 MG tablet take one tablet by mouth daily for cholesterol (Patient not taking: Reported on 11/24/2014) 90 tablet 1  . sildenafil (VIAGRA) 100 MG tablet Take 1 tablet (100 mg total) by mouth as needed for erectile dysfunction. 9 tablet 1   No current facility-administered medications on file prior to visit.   Medical History:  Past Medical History  Diagnosis Date  . ANXIETY STATE NEC 11/01/2006  . ANXIETY 12/25/2007  . CORONARY ARTERY DISEASE 08/02/2006    off Plavix x 3 years  . DIVERTICULITIS OF COLON 01/06/2010  . DVT, HX OF 08/02/2006   . 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 HYPERLIPIDEMIA 08/02/2006  . HYPERTENSION 08/05/2008  . Memory loss 07/21/2009  . Pilonidal cyst     low back pain   Allergies:  Allergies  Allergen Reactions  . Penicillins     unspecified     Review of Systems:  Review of Systems  Constitutional: Positive for malaise/fatigue. Negative for fever, chills, weight loss and diaphoresis.  HENT: Negative.   Eyes: Negative.   Respiratory: Negative.  Negative for cough, sputum production, shortness of breath and wheezing.   Cardiovascular: Positive for chest pain. Negative for palpitations, orthopnea, claudication, leg swelling and PND.  Gastrointestinal: Negative.  Negative for heartburn.  Genitourinary: Negative.   Musculoskeletal: Negative.   Skin: Negative.  Negative for itching and rash.  Neurological: Negative.  Negative for dizziness and weakness.  Psychiatric/Behavioral: Negative.        + increased stress    Family history- Review and unchanged Social history- Review and unchanged Physical Exam: BP 122/70 mmHg  Temp(Src) 98.4 F (36.9 C) (Temporal)  Resp 18  Ht 5' 10.5" (1.791 m)  Wt 205 lb (92.987 kg)  BMI 28.99 kg/m2 Wt Readings from Last 3 Encounters:  11/24/14 205 lb (92.987 kg)  07/30/14 206 lb 9.6 oz (93.713 kg)  03/19/14 209 lb (94.802 kg)   General Appearance: Well nourished, in no apparent distress. Eyes: PERRLA, EOMs, conjunctiva no swelling or erythema Sinuses: No Frontal/maxillary tenderness ENT/Mouth: Ext aud canals clear, TMs without erythema, bulging. No erythema, swelling, or exudate on post pharynx.  Tonsils not swollen or erythematous. Hearing normal.  Neck: Supple, thyroid normal.  Respiratory: Respiratory effort normal, BS equal bilaterally without rales, rhonchi, wheezing or stridor.  Cardio: RRR with no MRGs. Brisk peripheral pulses without edema.  Abdomen: Soft, + BS,  Non tender, no guarding, rebound, hernias, masses. Lymphatics: Non tender without  lymphadenopathy.  Musculoskeletal: Full ROM, 5/5 strength, Normal gait Skin: Warm, dry without rashes, lesions, ecchymosis.  Neuro: Cranial nerves intact. Normal muscle tone, no cerebellar symptoms. Psych: Awake and oriented X 3, normal affect, Insight and Judgment appropriate.    Vicie Mutters, PA-C 3:48 PM [facility]

## 2014-11-25 ENCOUNTER — Other Ambulatory Visit: Payer: 59

## 2014-11-25 DIAGNOSIS — R079 Chest pain, unspecified: Secondary | ICD-10-CM

## 2014-11-25 LAB — BASIC METABOLIC PANEL WITH GFR
BUN: 18 mg/dL (ref 7–25)
CALCIUM: 9.3 mg/dL (ref 8.6–10.3)
CO2: 25 mmol/L (ref 20–31)
Chloride: 104 mmol/L (ref 98–110)
Creat: 1.07 mg/dL (ref 0.70–1.25)
GFR, EST NON AFRICAN AMERICAN: 75 mL/min (ref >=60)
GFR, Est African American: 87 mL/min (ref >=60)
Glucose, Bld: 135 mg/dL — ABNORMAL HIGH (ref 65–99)
Potassium: 4.5 mmol/L (ref 3.5–5.3)
SODIUM: 138 mmol/L (ref 135–146)

## 2014-11-25 LAB — LIPID PANEL
CHOL/HDL RATIO: 6.3 ratio — AB (ref <=5.0)
CHOLESTEROL: 222 mg/dL — AB (ref 125–200)
HDL: 35 mg/dL — ABNORMAL LOW (ref >=40)
LDL Cholesterol: 151 mg/dL — ABNORMAL HIGH (ref <130)
TRIGLYCERIDES: 181 mg/dL — AB (ref <150)
VLDL: 36 mg/dL — AB (ref <30)

## 2014-11-25 LAB — HEPATIC FUNCTION PANEL
ALT: 14 U/L (ref 9–46)
AST: 12 U/L (ref 10–35)
Albumin: 4 g/dL (ref 3.6–5.1)
Alkaline Phosphatase: 51 U/L (ref 40–115)
Total Bilirubin: 0.3 mg/dL (ref 0.2–1.2)
Total Protein: 6.5 g/dL (ref 6.1–8.1)

## 2014-11-25 LAB — TSH: TSH: 2.243 u[IU]/mL (ref 0.350–4.500)

## 2014-11-25 NOTE — Addendum Note (Signed)
Addended by: Vicie Mutters R on: 11/25/2014 08:45 AM   Modules accepted: Orders

## 2014-11-26 LAB — CK TOTAL AND CKMB (NOT AT ARMC)
CK, MB: <0.7 ng/mL (ref 0.0–5.0)
Total CK: 52 U/L (ref 7–232)

## 2014-11-26 LAB — TROPONIN I: Troponin I: <0.01 ng/mL (ref <0.06)

## 2015-01-11 ENCOUNTER — Other Ambulatory Visit: Payer: Self-pay | Admitting: Internal Medicine

## 2015-02-19 ENCOUNTER — Encounter: Payer: Self-pay | Admitting: Internal Medicine

## 2015-02-19 ENCOUNTER — Ambulatory Visit (INDEPENDENT_AMBULATORY_CARE_PROVIDER_SITE_OTHER): Payer: 59 | Admitting: Internal Medicine

## 2015-02-19 VITALS — BP 136/84 | HR 76 | Temp 97.5°F | Resp 16 | Ht 71.0 in | Wt 201.7 lb

## 2015-02-19 DIAGNOSIS — I10 Essential (primary) hypertension: Secondary | ICD-10-CM

## 2015-02-19 DIAGNOSIS — E782 Mixed hyperlipidemia: Secondary | ICD-10-CM

## 2015-02-19 DIAGNOSIS — E559 Vitamin D deficiency, unspecified: Secondary | ICD-10-CM

## 2015-02-19 DIAGNOSIS — E039 Hypothyroidism, unspecified: Secondary | ICD-10-CM | POA: Diagnosis not present

## 2015-02-19 DIAGNOSIS — R7303 Prediabetes: Secondary | ICD-10-CM | POA: Diagnosis not present

## 2015-02-19 DIAGNOSIS — Z79899 Other long term (current) drug therapy: Secondary | ICD-10-CM | POA: Diagnosis not present

## 2015-02-19 LAB — CBC WITH DIFFERENTIAL/PLATELET
BASOS ABS: 0 10*3/uL (ref 0.0–0.1)
Basophils Relative: 0 % (ref 0–1)
EOS PCT: 4 % (ref 0–5)
Eosinophils Absolute: 0.3 10*3/uL (ref 0.0–0.7)
HEMATOCRIT: 45.1 % (ref 39.0–52.0)
HEMOGLOBIN: 15 g/dL (ref 13.0–17.0)
LYMPHS ABS: 1.2 10*3/uL (ref 0.7–4.0)
LYMPHS PCT: 17 % (ref 12–46)
MCH: 30.5 pg (ref 26.0–34.0)
MCHC: 33.3 g/dL (ref 30.0–36.0)
MCV: 91.9 fL (ref 78.0–100.0)
MPV: 11.4 fL (ref 8.6–12.4)
Monocytes Absolute: 0.6 10*3/uL (ref 0.1–1.0)
Monocytes Relative: 9 % (ref 3–12)
NEUTROS ABS: 4.8 10*3/uL (ref 1.7–7.7)
NEUTROS PCT: 70 % (ref 43–77)
Platelets: 328 10*3/uL (ref 150–400)
RBC: 4.91 MIL/uL (ref 4.22–5.81)
RDW: 14.3 % (ref 11.5–15.5)
WBC: 6.8 10*3/uL (ref 4.0–10.5)

## 2015-02-19 LAB — BASIC METABOLIC PANEL WITH GFR
BUN: 20 mg/dL (ref 7–25)
CHLORIDE: 103 mmol/L (ref 98–110)
CO2: 26 mmol/L (ref 20–31)
Calcium: 9.5 mg/dL (ref 8.6–10.3)
Creat: 1.26 mg/dL — ABNORMAL HIGH (ref 0.70–1.25)
GFR, Est African American: 71 mL/min (ref >=60)
GFR, Est Non African American: 61 mL/min (ref >=60)
GLUCOSE: 79 mg/dL (ref 65–99)
POTASSIUM: 4.3 mmol/L (ref 3.5–5.3)
Sodium: 138 mmol/L (ref 135–146)

## 2015-02-19 LAB — HEPATIC FUNCTION PANEL
ALBUMIN: 4.4 g/dL (ref 3.6–5.1)
ALK PHOS: 55 U/L (ref 40–115)
ALT: 17 U/L (ref 9–46)
AST: 13 U/L (ref 10–35)
BILIRUBIN INDIRECT: 0.6 mg/dL (ref 0.2–1.2)
Bilirubin, Direct: 0.1 mg/dL (ref <=0.2)
TOTAL PROTEIN: 6.8 g/dL (ref 6.1–8.1)
Total Bilirubin: 0.7 mg/dL (ref 0.2–1.2)

## 2015-02-19 LAB — TSH: TSH: 1.905 u[IU]/mL (ref 0.350–4.500)

## 2015-02-19 LAB — LIPID PANEL
Cholesterol: 188 mg/dL (ref 125–200)
HDL: 48 mg/dL (ref >=40)
LDL CALC: 98 mg/dL (ref <130)
Total CHOL/HDL Ratio: 3.9 Ratio (ref <=5.0)
Triglycerides: 212 mg/dL — ABNORMAL HIGH (ref <150)
VLDL: 42 mg/dL — AB (ref <30)

## 2015-02-19 LAB — MAGNESIUM: Magnesium: 1.9 mg/dL (ref 1.5–2.5)

## 2015-02-19 NOTE — Patient Instructions (Signed)

## 2015-02-19 NOTE — Progress Notes (Signed)
Patient ID: [name], male   DOB: [birth year], 60 y.o.   MRN: [id number]     This very nice 60 y.o.male presents for 3 month follow up with Hypertension, Hyperlipidemia, Pre-Diabetes and Vitamin D Deficiency.      Patient is treated for HTN since 2010  & BP has been controlled at home. Today's BP: 136/84 mmHg. Patient did have PTCA / Stenting in 2005. Patient has had no complaints of any cardiac type chest pain, palpitations, dyspnea/orthopnea/PND, dizziness, claudication, or dependent edema.     Hyperlipidemia is not controlled with diet & admittedly sporadically taking his medications( usu ~ 1 x/wk). Patient denies myalgias or other med SE's. Last Lipids were  Cholesterol 222*; HDL 35*; LDL 151*; Triglycerides 181 on 11/24/2014.     Also, the patient has history of PreDiabetes and has had no symptoms of reactive hypoglycemia, diabetic polys, paresthesias or visual blurring.  Last A1c was 6.0% on 07/30/2014.     Patient has hx/o elevated TSH in past and most recently  Off of meds was back to normal. Further, the patient also has history of Vitamin D Deficiency of 43 in June 2014 and supplements vitamin D without any suspected side-effects. Last vitamin D was 57 on 07/30/2014.  Medication Sig  . ALPRAZolam (XANAX) 0.5 MG tablet Take 1/2 to 1 tablet 2 or 3 x day if needed for anxiety  . aspirin EC 81 MG tablet 2 tabs po qd  . atorvastatin (LIPITOR) 80 MG tablet TAKE ONE TABLET BY MOUTH ONE TIME DAILY FOR CHOLESTEROL  . Cholecalciferol (VITAMIN D-3) 5000 UNITS TABS Take 1 tablet by mouth daily.  Marland Kitchen esomeprazole (NEXIUM) 20 MG capsule Take 20 mg by mouth daily at 12 noon. Takes OTC 20 mg  . lisinopril (PRINIVIL,ZESTRIL) 10 MG tablet TAKE ONE TABLET BY MOUTH ONE TIME DAILY. ONLY TAKE WHEN BLOOD PRESSURE IS HIGH.  . sildenafil (VIAGRA) 100 MG tablet Take 1 tablet (100 mg total) by mouth as needed for erectile dysfunction.   Allergies  Allergen Reactions  . Penicillins     unspecified   PMHx:    Past Medical History  Diagnosis Date  . ANXIETY STATE NEC 11/01/2006  . ANXIETY 12/25/2007  . CORONARY ARTERY DISEASE 08/02/2006    off Plavix x 3 years  . DIVERTICULITIS OF COLON 01/06/2010  . DVT, HX OF 08/02/2006  . 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 HYPERLIPIDEMIA 08/02/2006  . HYPERTENSION 08/05/2008  . Memory loss 07/21/2009  . Pilonidal cyst     low back pain   Immunization History  Administered Date(s) Administered  . PPD Test 07/24/2013, 07/30/2014  . Tdap 07/30/2010   Past Surgical History  Procedure Laterality Date  . Coronary angioplasty with stent placement    . Pilonidal cyst excision      teenager  . Esophagogastroduodenoscopy  06/16/2010    antral ulcers, esophageal stricture - dilated, duodenitis, hiatal hernia  . Colonoscopy w/ polypectomy  06/16/2010    diminutive adenoma, diverticulosis, hemorrhoids   FHx:    Reviewed / unchanged  SHx:    Reviewed / unchanged  Systems Review:  Constitutional: Denies fever, chills, wt changes, headaches, insomnia, fatigue, night sweats, change in appetite. Eyes: Denies redness, blurred vision, diplopia, discharge, itchy, watery eyes.  ENT: Denies discharge, congestion, post nasal drip, epistaxis, sore throat, earache, hearing loss, dental pain, tinnitus, vertigo, sinus pain, snoring.  CV: Denies chest pain, palpitations, irregular heartbeat, syncope, dyspnea, diaphoresis, orthopnea, PND, claudication or edema. Respiratory: denies cough, dyspnea, DOE, pleurisy,  hoarseness, laryngitis, wheezing.  Gastrointestinal: Denies dysphagia, odynophagia, heartburn, reflux, water brash, abdominal pain or cramps, nausea, vomiting, bloating, diarrhea, constipation, hematemesis, melena, hematochezia  or hemorrhoids. Genitourinary: Denies dysuria, frequency, urgency, nocturia, hesitancy, discharge, hematuria or flank pain. Musculoskeletal: Denies arthralgias, myalgias, stiffness, jt. swelling, pain, limping or strain/sprain.  Skin: Denies pruritus,  rash, hives, warts, acne, eczema or change in skin lesion(s). Neuro: No weakness, tremor, incoordination, spasms, paresthesia or pain. Psychiatric: Denies confusion, memory loss or sensory loss. Endo: Denies change in weight, skin or hair change.  Heme/Lymph: No excessive bleeding, bruising or enlarged lymph nodes.  Physical Exam  BP 136/84 mmHg  Pulse 76  Temp(Src) 97.5 F (36.4 C)  Resp 16  Ht 5\' 11"  (1.803 m)  Wt 201 lb 11.2 oz (91.491 kg)  BMI 28.14 kg/m2  Appears well nourished and in no distress. Eyes: PERRLA, EOMs, conjunctiva no swelling or erythema. Sinuses: No frontal/maxillary tenderness ENT/Mouth: EAC's clear, TM's nl w/o erythema, bulging. Nares clear w/o erythema, swelling, exudates. Oropharynx clear without erythema or exudates. Oral hygiene is good. Tongue normal, non obstructing. Hearing intact.  Neck: Supple. Thyroid nl. Car 2+/2+ without bruits, nodes or JVD. Chest: Respirations nl with BS clear & equal w/o rales, rhonchi, wheezing or stridor.  Cor: Heart sounds normal w/ regular rate and rhythm without sig. murmurs, gallops, clicks, or rubs. Peripheral pulses normal and equal  without edema.  Abdomen: Soft & bowel sounds normal. Non-tender w/o guarding, rebound, hernias, masses, or organomegaly.  Lymphatics: Unremarkable.  Musculoskeletal: Full ROM all peripheral extremities, joint stability, 5/5 strength, and normal gait.  Skin: Warm, dry without exposed rashes, lesions or ecchymosis apparent.  Neuro: Cranial nerves intact, reflexes equal bilaterally. Sensory-motor testing grossly intact. Tendon reflexes grossly intact.  Pysch: Alert & oriented x 3.  Insight and judgement nl & appropriate. No ideations.  Assessment and Plan:  1. Essential hypertension  - TSH  2. Hyperlipidemia  - Lipid panel - TSH  3. Prediabetes  - Hemoglobin A1c - Insulin, random  4. Hypothyroidism  - VITAMIN D 25 Hydroxy   5. Medication management  - CBC with  Differential/Platelet - BASIC METABOLIC PANEL WITH GFR - Hepatic function panel - Magnesium     Recommended regular exercise, BP monitoring, weight control, and discussed med and SE's. Recommended labs to assess and monitor clinical status. Further disposition pending results of labs. Over 30 minutes of exam, counseling, chart review was performed

## 2015-02-20 LAB — VITAMIN D 25 HYDROXY (VIT D DEFICIENCY, FRACTURES): Vit D, 25-Hydroxy: 67 ng/mL (ref 30–100)

## 2015-02-20 LAB — HEMOGLOBIN A1C
Hgb A1c MFr Bld: 5.7 % — ABNORMAL HIGH (ref <5.7)
MEAN PLASMA GLUCOSE: 117 mg/dL — AB (ref <117)

## 2015-02-20 LAB — INSULIN, RANDOM: INSULIN: 12.9 u[IU]/mL (ref 2.0–19.6)

## 2015-03-31 ENCOUNTER — Other Ambulatory Visit: Payer: Self-pay | Admitting: Internal Medicine

## 2015-03-31 DIAGNOSIS — F419 Anxiety disorder, unspecified: Secondary | ICD-10-CM

## 2015-06-03 ENCOUNTER — Encounter: Payer: Self-pay | Admitting: Internal Medicine

## 2015-06-03 ENCOUNTER — Ambulatory Visit (INDEPENDENT_AMBULATORY_CARE_PROVIDER_SITE_OTHER): Payer: 59 | Admitting: Internal Medicine

## 2015-06-03 VITALS — BP 128/64 | HR 70 | Temp 98.2°F | Resp 16 | Ht 71.0 in

## 2015-06-03 DIAGNOSIS — E785 Hyperlipidemia, unspecified: Secondary | ICD-10-CM

## 2015-06-03 DIAGNOSIS — E039 Hypothyroidism, unspecified: Secondary | ICD-10-CM | POA: Diagnosis not present

## 2015-06-03 DIAGNOSIS — Z79899 Other long term (current) drug therapy: Secondary | ICD-10-CM | POA: Diagnosis not present

## 2015-06-03 DIAGNOSIS — I10 Essential (primary) hypertension: Secondary | ICD-10-CM | POA: Diagnosis not present

## 2015-06-03 DIAGNOSIS — E559 Vitamin D deficiency, unspecified: Secondary | ICD-10-CM

## 2015-06-03 DIAGNOSIS — R7303 Prediabetes: Secondary | ICD-10-CM

## 2015-06-03 LAB — BASIC METABOLIC PANEL WITH GFR
BUN: 15 mg/dL (ref 7–25)
CALCIUM: 9.5 mg/dL (ref 8.6–10.3)
CHLORIDE: 104 mmol/L (ref 98–110)
CO2: 26 mmol/L (ref 20–31)
CREATININE: 1.19 mg/dL (ref 0.70–1.25)
GFR, Est African American: 76 mL/min (ref >=60)
GFR, Est Non African American: 66 mL/min (ref >=60)
Glucose, Bld: 96 mg/dL (ref 65–99)
Potassium: 4.7 mmol/L (ref 3.5–5.3)
SODIUM: 139 mmol/L (ref 135–146)

## 2015-06-03 LAB — HEMOGLOBIN A1C
HEMOGLOBIN A1C: 5.7 % — AB (ref <5.7)
MEAN PLASMA GLUCOSE: 117 mg/dL

## 2015-06-03 LAB — LIPID PANEL
CHOL/HDL RATIO: 2.9 ratio (ref <=5.0)
CHOLESTEROL: 153 mg/dL (ref 125–200)
HDL: 53 mg/dL (ref >=40)
LDL Cholesterol: 83 mg/dL (ref <130)
Triglycerides: 84 mg/dL (ref <150)
VLDL: 17 mg/dL (ref <30)

## 2015-06-03 LAB — CBC WITH DIFFERENTIAL/PLATELET
BASOS ABS: 60 {cells}/uL (ref 0–200)
Basophils Relative: 1 %
EOS ABS: 180 {cells}/uL (ref 15–500)
EOS PCT: 3 %
HCT: 44.4 % (ref 38.5–50.0)
Hemoglobin: 15.4 g/dL (ref 13.2–17.1)
Lymphocytes Relative: 21 %
Lymphs Abs: 1260 cells/uL (ref 850–3900)
MCH: 31.5 pg (ref 27.0–33.0)
MCHC: 34.7 g/dL (ref 32.0–36.0)
MCV: 90.8 fL (ref 80.0–100.0)
MONOS PCT: 8 %
MPV: 11.3 fL (ref 7.5–12.5)
Monocytes Absolute: 480 cells/uL (ref 200–950)
NEUTROS PCT: 67 %
Neutro Abs: 4020 cells/uL (ref 1500–7800)
PLATELETS: 286 10*3/uL (ref 140–400)
RBC: 4.89 MIL/uL (ref 4.20–5.80)
RDW: 14.2 % (ref 11.0–15.0)
WBC: 6 10*3/uL (ref 3.8–10.8)

## 2015-06-03 LAB — TSH: TSH: 2.84 m[IU]/L (ref 0.40–4.50)

## 2015-06-03 LAB — HEPATIC FUNCTION PANEL
ALBUMIN: 4.1 g/dL (ref 3.6–5.1)
ALT: 19 U/L (ref 9–46)
AST: 15 U/L (ref 10–35)
Alkaline Phosphatase: 54 U/L (ref 40–115)
BILIRUBIN TOTAL: 0.6 mg/dL (ref 0.2–1.2)
Bilirubin, Direct: 0.1 mg/dL (ref <=0.2)
Indirect Bilirubin: 0.5 mg/dL (ref 0.2–1.2)
TOTAL PROTEIN: 6.7 g/dL (ref 6.1–8.1)

## 2015-06-03 NOTE — Progress Notes (Signed)
Assessment and Plan:  Hypertension:  -Continue medication,  -monitor blood pressure at home.  -Continue DASH diet.   -Reminder to go to the ER if any CP, SOB, nausea, dizziness, severe HA, changes vision/speech, left arm numbness and tingling, and jaw pain.  Cholesterol: -Continue diet and exercise.  -Check cholesterol.   Pre-diabetes: -Continue diet and exercise.  -Check A1C  Vitamin D Def: -check level -continue medications.   Depression -patient admits to depression without suicidal or homicidal ideations -no interested in medicines at this time -not willing to do counseling -says "its all bullshit"  Continue diet and meds as discussed. Further disposition pending results of labs.  HPI 60 y.o. male  presents for 3 month follow up with hypertension, hyperlipidemia, prediabetes and vitamin D.   His blood pressure has been controlled at home, today their BP is BP: 128/64 mmHg.   He does not workout. He denies chest pain, shortness of breath, dizziness.   He is on cholesterol medication and denies myalgias. His cholesterol is at goal. The cholesterol last visit was:   Lab Results  Component Value Date   CHOL 188 02/19/2015   HDL 48 02/19/2015   LDLCALC 98 02/19/2015   LDLDIRECT 190.6 07/30/2010   TRIG 212* 02/19/2015   CHOLHDL 3.9 02/19/2015     He has been working on diet and exercise for prediabetes, and denies foot ulcerations, hyperglycemia, hypoglycemia , increased appetite, nausea, paresthesia of the feet, polydipsia, polyuria, visual disturbances, vomiting and weight loss. Last A1C in the office was:  Lab Results  Component Value Date   HGBA1C 5.7* 02/19/2015    Patient is on Vitamin D supplement.  Lab Results  Component Value Date   VD25OH 67 02/19/2015       Current Medications:  Current Outpatient Prescriptions on File Prior to Visit  Medication Sig Dispense Refill  . ALPRAZolam (XANAX) 0.5 MG tablet TAKE 1/2 TO 1 TABLET BY MOUTH TWICE A DAY TO 3  TIMES A DAY AS NEEDED FOR ANXIETY 90 tablet 5  . aspirin EC 81 MG tablet 2 tabs po qd    . atorvastatin (LIPITOR) 80 MG tablet TAKE ONE TABLET BY MOUTH ONE TIME DAILY FOR CHOLESTEROL 90 tablet 1  . Cholecalciferol (VITAMIN D-3) 5000 UNITS TABS Take 1 tablet by mouth daily.    Marland Kitchen esomeprazole (NEXIUM) 20 MG capsule Take 20 mg by mouth daily at 12 noon. Takes OTC 20 mg    . lisinopril (PRINIVIL,ZESTRIL) 10 MG tablet TAKE ONE TABLET BY MOUTH ONE TIME DAILY. ONLY TAKE WHEN BLOOD PRESSURE IS HIGH. 90 tablet 1  . sildenafil (VIAGRA) 100 MG tablet Take 1 tablet (100 mg total) by mouth as needed for erectile dysfunction. 9 tablet 1   No current facility-administered medications on file prior to visit.    Medical History:  Past Medical History  Diagnosis Date  . ANXIETY STATE NEC 11/01/2006  . ANXIETY 12/25/2007  . CORONARY ARTERY DISEASE 08/02/2006    off Plavix x 3 years  . DIVERTICULITIS OF COLON 01/06/2010  . DVT, HX OF 08/02/2006  . 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 HYPERLIPIDEMIA 08/02/2006  . HYPERTENSION 08/05/2008  . Memory loss 07/21/2009  . Pilonidal cyst     low back pain    Allergies:  Allergies  Allergen Reactions  . Penicillins     unspecified     Review of Systems:  Review of Systems  Constitutional: Negative for fever, chills and malaise/fatigue.  HENT: Negative for congestion, ear pain and sore  throat.   Respiratory: Negative for cough, shortness of breath and wheezing.   Cardiovascular: Negative for chest pain, palpitations and leg swelling.  Gastrointestinal: Negative for heartburn, diarrhea, constipation, blood in stool and melena.  Genitourinary: Negative.   Skin: Negative.   Neurological: Negative for dizziness, sensory change, loss of consciousness and headaches.  Psychiatric/Behavioral: Positive for depression. The patient is not nervous/anxious and does not have insomnia.     Family history- Review and unchanged  Social history- Review and  unchanged  Physical Exam: BP 128/64 mmHg  Pulse 70  Temp(Src) 98.2 F (36.8 C) (Temporal)  Resp 16  Ht 5\' 11"  (1.803 m) Wt Readings from Last 3 Encounters:  02/19/15 201 lb 11.2 oz (91.491 kg)  11/24/14 205 lb (92.987 kg)  07/30/14 206 lb 9.6 oz (93.713 kg)    General Appearance: Well nourished well developed, in no apparent distress. Eyes: PERRLA, EOMs, conjunctiva no swelling or erythema ENT/Mouth: Ear canals normal without obstruction, swelling, erythma, discharge.  TMs normal bilaterally.  Oropharynx moist, clear, without exudate, or postoropharyngeal swelling. Neck: Supple, thyroid normal,no cervical adenopathy  Respiratory: Respiratory effort normal, Breath sounds clear A&P without rhonchi, wheeze, or rale.  No retractions, no accessory usage. Cardio: RRR with no MRGs. Brisk peripheral pulses without edema.  Abdomen: Soft, + BS,  Non tender, no guarding, rebound, hernias, masses. Musculoskeletal: Full ROM, 5/5 strength, Normal gait Skin: Warm, dry without rashes, lesions, ecchymosis.  Neuro: Awake and oriented X 3, Cranial nerves intact. Normal muscle tone, no cerebellar symptoms. Psych: abrasive and flat affect, poor attitude, argumentative.    Francis Skeans, PA-C 8:59 AM [facility]

## 2015-06-04 ENCOUNTER — Other Ambulatory Visit: Payer: Self-pay | Admitting: Internal Medicine

## 2015-06-24 ENCOUNTER — Other Ambulatory Visit: Payer: Self-pay | Admitting: *Deleted

## 2015-06-24 MED ORDER — SILDENAFIL CITRATE 20 MG PO TABS: ORAL_TABLET | ORAL | Status: DC

## 2015-07-22 ENCOUNTER — Other Ambulatory Visit: Payer: Self-pay | Admitting: Internal Medicine

## 2015-08-18 ENCOUNTER — Encounter: Payer: Self-pay | Admitting: Internal Medicine

## 2015-08-18 ENCOUNTER — Ambulatory Visit (INDEPENDENT_AMBULATORY_CARE_PROVIDER_SITE_OTHER): Payer: 59 | Admitting: Internal Medicine

## 2015-08-18 VITALS — BP 126/80 | HR 80 | Temp 97.5°F | Resp 16 | Ht 71.0 in | Wt 207.4 lb

## 2015-08-18 DIAGNOSIS — I10 Essential (primary) hypertension: Secondary | ICD-10-CM

## 2015-08-18 DIAGNOSIS — Z79899 Other long term (current) drug therapy: Secondary | ICD-10-CM | POA: Diagnosis not present

## 2015-08-18 DIAGNOSIS — Z0000 Encounter for general adult medical examination without abnormal findings: Secondary | ICD-10-CM | POA: Diagnosis not present

## 2015-08-18 DIAGNOSIS — Z125 Encounter for screening for malignant neoplasm of prostate: Secondary | ICD-10-CM | POA: Diagnosis not present

## 2015-08-18 DIAGNOSIS — E039 Hypothyroidism, unspecified: Secondary | ICD-10-CM

## 2015-08-18 DIAGNOSIS — I25810 Atherosclerosis of coronary artery bypass graft(s) without angina pectoris: Secondary | ICD-10-CM

## 2015-08-18 DIAGNOSIS — Z0001 Encounter for general adult medical examination with abnormal findings: Secondary | ICD-10-CM

## 2015-08-18 DIAGNOSIS — E782 Mixed hyperlipidemia: Secondary | ICD-10-CM

## 2015-08-18 DIAGNOSIS — E559 Vitamin D deficiency, unspecified: Secondary | ICD-10-CM | POA: Diagnosis not present

## 2015-08-18 DIAGNOSIS — Z1212 Encounter for screening for malignant neoplasm of rectum: Secondary | ICD-10-CM

## 2015-08-18 DIAGNOSIS — R5383 Other fatigue: Secondary | ICD-10-CM

## 2015-08-18 DIAGNOSIS — K219 Gastro-esophageal reflux disease without esophagitis: Secondary | ICD-10-CM

## 2015-08-18 DIAGNOSIS — R7303 Prediabetes: Secondary | ICD-10-CM

## 2015-08-18 DIAGNOSIS — K222 Esophageal obstruction: Secondary | ICD-10-CM

## 2015-08-18 LAB — CBC WITH DIFFERENTIAL/PLATELET
BASOS ABS: 62 {cells}/uL (ref 0–200)
BASOS PCT: 1 %
EOS ABS: 124 {cells}/uL (ref 15–500)
Eosinophils Relative: 2 %
HEMATOCRIT: 44.2 % (ref 38.5–50.0)
Hemoglobin: 15 g/dL (ref 13.2–17.1)
LYMPHS PCT: 20 %
Lymphs Abs: 1240 cells/uL (ref 850–3900)
MCH: 31.2 pg (ref 27.0–33.0)
MCHC: 33.9 g/dL (ref 32.0–36.0)
MCV: 91.9 fL (ref 80.0–100.0)
MONO ABS: 496 {cells}/uL (ref 200–950)
MONOS PCT: 8 %
MPV: 11.7 fL (ref 7.5–12.5)
NEUTROS ABS: 4278 {cells}/uL (ref 1500–7800)
NEUTROS PCT: 69 %
PLATELETS: 288 10*3/uL (ref 140–400)
RBC: 4.81 MIL/uL (ref 4.20–5.80)
RDW: 14.2 % (ref 11.0–15.0)
WBC: 6.2 10*3/uL (ref 3.8–10.8)

## 2015-08-18 LAB — HEMOGLOBIN A1C
HEMOGLOBIN A1C: 5.9 % — AB (ref <5.7)
MEAN PLASMA GLUCOSE: 123 mg/dL

## 2015-08-18 LAB — MAGNESIUM: Magnesium: 1.9 mg/dL (ref 1.5–2.5)

## 2015-08-18 LAB — HEPATIC FUNCTION PANEL
ALBUMIN: 4.2 g/dL (ref 3.6–5.1)
ALT: 16 U/L (ref 9–46)
AST: 13 U/L (ref 10–35)
Alkaline Phosphatase: 56 U/L (ref 40–115)
BILIRUBIN INDIRECT: 0.3 mg/dL (ref 0.2–1.2)
Bilirubin, Direct: 0.1 mg/dL (ref <=0.2)
TOTAL PROTEIN: 6.5 g/dL (ref 6.1–8.1)
Total Bilirubin: 0.4 mg/dL (ref 0.2–1.2)

## 2015-08-18 LAB — LIPID PANEL
CHOLESTEROL: 200 mg/dL (ref 125–200)
HDL: 46 mg/dL (ref >=40)
LDL CALC: 121 mg/dL (ref <130)
Total CHOL/HDL Ratio: 4.3 Ratio (ref <=5.0)
Triglycerides: 163 mg/dL — ABNORMAL HIGH (ref <150)
VLDL: 33 mg/dL — AB (ref <30)

## 2015-08-18 LAB — BASIC METABOLIC PANEL WITH GFR
BUN: 21 mg/dL (ref 7–25)
CALCIUM: 9.3 mg/dL (ref 8.6–10.3)
CO2: 25 mmol/L (ref 20–31)
Chloride: 105 mmol/L (ref 98–110)
Creat: 1.34 mg/dL — ABNORMAL HIGH (ref 0.70–1.25)
GFR, EST AFRICAN AMERICAN: 66 mL/min (ref >=60)
GFR, EST NON AFRICAN AMERICAN: 57 mL/min — AB (ref >=60)
Glucose, Bld: 88 mg/dL (ref 65–99)
Potassium: 4.6 mmol/L (ref 3.5–5.3)
SODIUM: 139 mmol/L (ref 135–146)

## 2015-08-18 LAB — TSH: TSH: 2.71 mIU/L (ref 0.40–4.50)

## 2015-08-18 LAB — IRON AND TIBC
%SAT: 36 % (ref 15–60)
Iron: 131 ug/dL (ref 50–180)
TIBC: 365 ug/dL (ref 250–425)
UIBC: 234 ug/dL (ref 125–400)

## 2015-08-18 NOTE — Patient Instructions (Signed)

## 2015-08-18 NOTE — Progress Notes (Signed)
Patient ID: [name], male   DOB: 23-Sep-1955, 60 y.o.   MRN: [id number]  [facility]   Unk Pinto, M.D.    Uvaldo Bristle. Silverio Lay, P.A.-C      Starlyn Skeans, P.A.-C   [facility]                [address] [place], [place] [id number] Telephone [phone] Telefax [phone] _________________________________  Annual  Screening/Preventative Visit And Comprehensive Evaluation & Examination     This very nice 60 y.o. MWM presents for a Wellness/Preventative Visit & comprehensive evaluation and management of multiple medical co-morbidities.  Patient has been followed for HTN, Prediabetes, Hyperlipidemia and Vitamin D Deficiency. Patient prefaces that he is a "Cynic" and feels many medical testing are unjustified. Patient also has hx/o GERD controlled with diet & Omeprazole.     HTN predates circa 2010. Patient's BP has been controlled at home.Today's BP is 126/80 mmHg. In 2008 , patient had a Stent and is followed sporadically by Dr Dorris Carnes, altho he admits that he is poorly compliant in f/u. Patient denies any cardiac symptoms as chest pain, palpitations, shortness of breath, dizziness or ankle swelling.     Patient's hyperlipidemia is controlled with diet and medications. Patient denies myalgias or other medication SE's. Last lipids were at goal with  Cholesterol 153; HDL 53; LDL 83; Triglycerides 84 on 06/03/2015. In 2005, patirnt was treated for a DVDT.      Patient has prediabetes since    and patient denies reactive hypoglycemic symptoms, visual blurring, diabetic polys or paresthesias. Last A1c was  Bld 5.7% on 06/03/2015.     Finally, patient has history of Vitamin D Deficiency of "22" on treatment in 2014  and last vitamin D was  67 on 02/19/2015.  Medication Sig  . ALPRAZolam0.5 MG tablet TAKE 1/2 TO 1 TABLET BY MOUTH TWICE A DAY TO 3 TIMES A DAY AS NEEDED FOR ANXIETY  . aspirin EC 81 MG   2 tabs po qd  . atorvastatin  80 MG TAKE ONE TABLET BY MOUTH ONE TIME DAILY FOR CHOLESTEROL  . VITAMIN D 5000 UNITS  Take 1 tablet by mouth daily.  Marland Kitchen esomeprazole  20 MG  Take 20 mg by mouth daily at 12 noon. Takes OTC 20 mg  . lisinopril 10 MG  TAKE ONE TABLET BY MOUTH ONE TIME DAILY. ONLY TAKE WHEN BLOOD PRESSURE IS HIGH.  . sildenafil  20 MG  TAKES 1 TO 5 TABLETS DAILY AS NEEDED.  Marland Kitchen sildenafil  100 MG  Take 1 tablet (100 mg total) by mouth as needed for erectile dysfunction.    Allergies  Allergen Reactions  . Penicillins     unspecified   Past Medical History  Diagnosis Date  . ANXIETY STATE NEC 11/01/2006  . ANXIETY 12/25/2007  . CORONARY ARTERY DISEASE 08/02/2006    off Plavix x 3 years  . DIVERTICULITIS OF COLON 01/06/2010  . DVT, HX OF 08/02/2006  . 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 HYPERLIPIDEMIA 08/02/2006  . HYPERTENSION 08/05/2008  . Memory loss 07/21/2009  . Pilonidal cyst     low back pain   Health Maintenance  Topic Date Due  . ZOSTAVAX  12/24/2013  . INFLUENZA VACCINE  09/08/2015  . COLONOSCOPY  06/15/2017  . TETANUS/TDAP  07/29/2020  . Hepatitis C Screening  Completed  . HIV Screening  Completed   Immunization History  Administered Date(s) Administered  . PPD Test 07/24/2013, 07/30/2014  . Tdap 07/30/2010   Past Surgical History  Procedure Laterality Date  . Coronary angioplasty with stent placement    . Pilonidal cyst excision      teenager  . Esophagogastroduodenoscopy  06/16/2010    antral ulcers, esophageal stricture - dilated, duodenitis, hiatal hernia  . Colonoscopy w/ polypectomy  06/16/2010    diminutive adenoma, diverticulosis, hemorrhoids   Family History  Problem Relation Age of Onset  . Colon cancer Father     deceased age 59  . Dementia Mother     Social History   Social History  . Marital Status: Married    Spouse Name: N/A  . Number of Children: 2  . Years of Education: N/A   Occupational History  . Secondary school teacher    Social  History Main Topics  . Smoking status: Never Smoker   . Smokeless tobacco: Never Used  . Alcohol Use: Yes     Comment: approx 2 drinks per week  . Drug Use: Yes     Comment: marjuana as per 2011 Echart H&P denies any at this time (06/2010  . Sexual Activity: Not on file   Other Topics Concern  . Not on file   Social History Narrative   Daily caffeine    ROS Constitutional: Denies fever, chills, weight loss/gain, headaches, insomnia,  night sweats or change in appetite. Does c/o fatigue. Eyes: Denies redness, blurred vision, diplopia, discharge, itchy or watery eyes.  ENT: Denies discharge, congestion, post nasal drip, epistaxis, sore throat, earache, hearing loss, dental pain, Tinnitus, Vertigo, Sinus pain or snoring.  Cardio: Denies chest pain, palpitations, irregular heartbeat, syncope, dyspnea, diaphoresis, orthopnea, PND, claudication or edema Respiratory: denies cough, dyspnea, DOE, pleurisy, hoarseness, laryngitis or wheezing.  Gastrointestinal: Denies dysphagia, heartburn, reflux, water brash, pain, cramps, nausea, vomiting, bloating, diarrhea, constipation, hematemesis, melena, hematochezia, jaundice or hemorrhoids Genitourinary: Denies dysuria, frequency, urgency, nocturia, hesitancy, discharge, hematuria or flank pain Musculoskeletal: Denies arthralgia, myalgia, stiffness, Jt. Swelling, pain, limp or strain/sprain. Denies Falls. Skin: Denies puritis, rash, hives, warts, acne, eczema or change in skin lesion Neuro: No weakness, tremor, incoordination, spasms, paresthesia or pain Psychiatric: Denies confusion, memory loss or sensory loss. Denies Depression. Endocrine: Denies change in weight, skin, hair change, nocturia, and paresthesia, diabetic polys, visual blurring or hyper / hypo glycemic episodes.  Heme/Lymph: No excessive bleeding, bruising or enlarged lymph nodes.  Physical Exam  BP 126/80 mmHg  Pulse 80  Temp(Src) 97.5 F (36.4 C)  Resp 16  Ht 5\' 11"  (1.803 m)   Wt 207 lb 6.4 oz (94.076 kg)  BMI 28.94 kg/m2  General Appearance: Well nourished, in no apparent distress. Eyes: PERRLA, EOMs, conjunctiva no swelling or erythema, normal fundi and vessels. Sinuses: No frontal/maxillary tenderness ENT/Mouth: EACs patent / TMs  nl. Nares clear without erythema, swelling, mucoid exudates. Oral hygiene is good. No erythema, swelling, or exudate. Tongue normal, non-obstructing. Tonsils not swollen or erythematous. Hearing normal.  Neck: Supple, thyroid normal. No bruits, nodes or JVD. Respiratory: Respiratory effort normal.  BS equal and clear bilateral without rales, rhonci, wheezing or stridor. Cardio: Heart sounds are normal with regular rate and rhythm and no murmurs, rubs or gallops. Peripheral pulses are normal and equal bilaterally without edema. No aortic or femoral bruits. Chest: symmetric with normal excursions and percussion.  Abdomen: Soft, with Nl bowel sounds. Nontender, no guarding, rebound, hernias, masses, or organomegaly.  Lymphatics: Non tender without lymphadenopathy.  Genitourinary: No hernias.Testes nl. DRE - prostate nl for age - smooth & firm w/o nodules. Musculoskeletal: Full ROM all peripheral extremities, joint stability, 5/5 strength, and normal gait. Skin: Warm and dry without rashes, lesions, cyanosis, clubbing or  ecchymosis.  Neuro: Cranial nerves intact, reflexes equal bilaterally. Normal muscle tone, no cerebellar symptoms. Sensation intact.  Pysch: Alert and oriented X 3 with normal affect, insight and judgment appropriate.   Assessment and Plan  1. Annual Preventative/Screening Exam    Continue prudent diet as discussed, weight control, BP monitoring, regular exercise, and medications as discussed.  Discussed med effects and SE's. Routine screening labs and tests as requested with regular follow-up as recommended. Over 40 minutes of exam, counseling, chart review and high complex critical decision making was performed

## 2015-08-19 ENCOUNTER — Other Ambulatory Visit: Payer: Self-pay | Admitting: Internal Medicine

## 2015-08-19 LAB — URINALYSIS, MICROSCOPIC ONLY
Bacteria, UA: NONE SEEN [HPF]
CASTS: NONE SEEN [LPF]
Crystals: NONE SEEN [HPF]
RBC / HPF: NONE SEEN RBC/HPF (ref <=2)
SQUAMOUS EPITHELIAL / LPF: NONE SEEN [HPF] (ref <=5)
Yeast: NONE SEEN [HPF]

## 2015-08-19 LAB — INSULIN, RANDOM: INSULIN: 10.7 u[IU]/mL (ref 2.0–19.6)

## 2015-08-19 LAB — URINALYSIS, ROUTINE W REFLEX MICROSCOPIC
Bilirubin Urine: NEGATIVE
GLUCOSE, UA: NEGATIVE
Hgb urine dipstick: NEGATIVE
Ketones, ur: NEGATIVE
NITRITE: NEGATIVE
PH: 6 (ref 5.0–8.0)
Protein, ur: NEGATIVE
SPECIFIC GRAVITY, URINE: 1.017 (ref 1.001–1.035)

## 2015-08-19 LAB — TESTOSTERONE: TESTOSTERONE: 489 ng/dL (ref 250–827)

## 2015-08-19 LAB — MICROALBUMIN / CREATININE URINE RATIO
CREATININE, URINE: 116 mg/dL (ref 20–370)
Microalb, Ur: <0.2 mg/dL

## 2015-08-19 LAB — PSA: PSA: 3.46 ng/mL (ref <=4.00)

## 2015-08-19 LAB — VITAMIN B12: VITAMIN B 12: 314 pg/mL (ref 200–1100)

## 2015-08-19 LAB — VITAMIN D 25 HYDROXY (VIT D DEFICIENCY, FRACTURES): Vit D, 25-Hydroxy: 76 ng/mL (ref 30–100)

## 2015-08-19 MED ORDER — CIPROFLOXACIN HCL 500 MG PO TABS: ORAL_TABLET | ORAL | Status: AC

## 2015-08-19 NOTE — Addendum Note (Signed)
Addended by: Unk Pinto on: 08/19/2015 09:02 AM   Modules accepted: Orders

## 2015-09-16 ENCOUNTER — Encounter: Payer: Self-pay | Admitting: Internal Medicine

## 2015-09-16 ENCOUNTER — Ambulatory Visit (INDEPENDENT_AMBULATORY_CARE_PROVIDER_SITE_OTHER): Payer: 59 | Admitting: Internal Medicine

## 2015-09-16 VITALS — BP 132/84 | HR 64 | Temp 97.3°F | Resp 16 | Ht 71.0 in | Wt 206.6 lb

## 2015-09-16 DIAGNOSIS — N410 Acute prostatitis: Secondary | ICD-10-CM

## 2015-09-16 DIAGNOSIS — R972 Elevated prostate specific antigen [PSA]: Secondary | ICD-10-CM

## 2015-09-16 DIAGNOSIS — N419 Inflammatory disease of prostate, unspecified: Secondary | ICD-10-CM

## 2015-09-16 DIAGNOSIS — M5432 Sciatica, left side: Secondary | ICD-10-CM | POA: Diagnosis not present

## 2015-09-16 HISTORY — DX: Inflammatory disease of prostate, unspecified: N41.9

## 2015-09-16 MED ORDER — PREDNISONE 20 MG PO TABS: ORAL_TABLET | ORAL | 0 refills | Status: DC

## 2015-09-16 NOTE — Progress Notes (Signed)
Subjective:    Patient ID: [name], male    DOB: 1955/08/01, 60 y.o.   MRN: [id number]  HPI   This nice 60 yo MWM was seen 1 month ago and dx'd with prostatitis and treated with Cipro. His PSA was 1.98 in 2016 and was recently 3.46 one month ago. Now he's also c/o some vague discomfort in the sacrococcygeal area and describes a deep ache radiating down the Left buttock to the posterior thigh and calf to the foot.  Outpatient Medications Prior to Visit  Medication Sig Dispense Refill  . ALPRAZolam (XANAX) 0.5 MG tablet TAKE 1/2 TO 1 TABLET BY MOUTH TWICE A DAY TO 3 TIMES A DAY AS NEEDED FOR ANXIETY 90 tablet 5  . aspirin EC 81 MG tablet 2 tabs po qd    . atorvastatin (LIPITOR) 80 MG tablet TAKE ONE TABLET BY MOUTH ONE TIME DAILY FOR CHOLESTEROL 90 tablet 1  . Cholecalciferol (VITAMIN D-3) 5000 UNITS TABS Take 1 tablet by mouth daily.    Marland Kitchen esomeprazole (NEXIUM) 20 MG capsule Take 20 mg by mouth daily at 12 noon. Takes OTC 20 mg    . lisinopril (PRINIVIL,ZESTRIL) 10 MG tablet TAKE ONE TABLET BY MOUTH ONE TIME DAILY. ONLY TAKE WHEN BLOOD PRESSURE IS HIGH. 90 tablet 1  . sildenafil (REVATIO) 20 MG tablet TAKES 1 TO 5 TABLETS DAILY AS NEEDED. 30 tablet 0  . sildenafil (VIAGRA) 100 MG tablet Take 1 tablet (100 mg total) by mouth as needed for erectile dysfunction. 9 tablet 1   No facility-administered medications prior to visit.    Allergies  Allergen Reactions  . Penicillins     unspecified   Past Medical History:  Diagnosis Date  . ANXIETY 12/25/2007  . ANXIETY STATE NEC 11/01/2006  . CORONARY ARTERY DISEASE 08/02/2006   off Plavix x 3 years  . DIVERTICULITIS OF COLON 01/06/2010  . DVT, HX OF 08/02/2006  . 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 HYPERLIPIDEMIA 08/02/2006  . HYPERTENSION 08/05/2008  . Memory loss 07/21/2009  . Pilonidal cyst    low back pain   Review of Systems 10 point systems review negative except as above.    Objective:   Physical Exam   BP 132/84   Pulse 64    Temp 97.3 F (36.3 C)   Resp 16   Ht 5\' 11"  (1.803 m)   Wt 206 lb 9.6 oz (93.7 kg)   BMI 28.81 kg/m   HEENT - Eac's patent. TM's Nl. EOM's full. PERRLA. NasoOroPharynx clear. Neck - supple. Nl Thyroid. Carotids 2+ & No bruits, nodes, JVD Chest - Clear equal BS w/o Rales, rhonchi, wheezes. Cor - Nl HS. RRR w/o sig MGR. PP 1(+). No edema. Abd - No palpable organomegaly, masses or tenderness. BS nl. MS- FROM w/o deformities. Muscle power, tone and bulk Nl. Gait Nl.Bilat hip Rom and fig-4 is negative/Nl and SLR is negative bilaterally.  GU - DRE finds prostate 2+ smooth, but sl boggy & tender.  Hemoccult negative.  Neuro - No obvious Cr N abnormalities. Sensory, motor and Cerebellar functions appear Nl w/o focal abnormalities.    Assessment & Plan:   1. Sciatica of left side  - predniSONE (DELTASONE) 20 MG tablet; 1 tab 3 x day for 3 days, then 1 tab 2 x day for 3 days, then 1 tab 1 x day for 5 days  Dispense: 20 tablet; Refill: 0  2. Acute prostatitis  - Urinalysis, Routine w reflex microscopic (not at [facility]) -  Urine culture  3. Elevated PSA  - PSA, total and free; Future

## 2015-09-16 NOTE — Patient Instructions (Addendum)
Sciatica Sciatica is pain, weakness, numbness, or tingling along the path of the sciatic nerve. The nerve starts in the lower back and runs down the back of each leg. The nerve controls the muscles in the lower leg and in the back of the knee, while also providing sensation to the back of the thigh, lower leg, and the sole of your foot. Sciatica is a symptom of another medical condition. For instance, nerve damage or certain conditions, such as a herniated disk or bone spur on the spine, pinch or put pressure on the sciatic nerve. This causes the pain, weakness, or other sensations normally associated with sciatica. Generally, sciatica only affects one side of the body. CAUSES   Herniated or slipped disc.  Degenerative disk disease.  A pain disorder involving the narrow muscle in the buttocks (piriformis syndrome).  Pelvic injury or fracture.  Pregnancy.  Tumor (rare). SYMPTOMS  Symptoms can vary from mild to very severe. The symptoms usually travel from the low back to the buttocks and down the back of the leg. Symptoms can include:  Mild tingling or dull aches in the lower back, leg, or hip.  Numbness in the back of the calf or sole of the foot.  Burning sensations in the lower back, leg, or hip.  Sharp pains in the lower back, leg, or hip.  Leg weakness.  Severe back pain inhibiting movement. These symptoms may get worse with coughing, sneezing, laughing, or prolonged sitting or standing. Also, being overweight may worsen symptoms. DIAGNOSIS  Your caregiver will perform a physical exam to look for common symptoms of sciatica. He or she may ask you to do certain movements or activities that would trigger sciatic nerve pain. Other tests may be performed to find the cause of the sciatica. These may include:  Blood tests.  X-rays.  Imaging tests, such as an MRI or CT scan. TREATMENT  Treatment is directed at the cause of the sciatic pain. Sometimes, treatment is not necessary  and the pain and discomfort goes away on its own. If treatment is needed, your caregiver may suggest:  Over-the-counter medicines to relieve pain.  Prescription medicines, such as anti-inflammatory medicine, muscle relaxants, or narcotics.  Applying heat or ice to the painful area.  Steroid injections to lessen pain, irritation, and inflammation around the nerve.  Reducing activity during periods of pain.  Exercising and stretching to strengthen your abdomen and improve flexibility of your spine. Your caregiver may suggest losing weight if the extra weight makes the back pain worse.  Physical therapy.  Surgery to eliminate what is pressing or pinching the nerve, such as a bone spur or part of a herniated disk. HOME CARE INSTRUCTIONS   Only take over-the-counter or prescription medicines for pain or discomfort as directed by your caregiver.  Apply ice to the affected area for 20 minutes, 3-4 times a day for the first 48-72 hours. Then try heat in the same way.  Exercise, stretch, or perform your usual activities if these do not aggravate your pain.  Attend physical therapy sessions as directed by your caregiver.  Keep all follow-up appointments as directed by your caregiver.  Do not wear high heels or shoes that do not provide proper support.  Check your mattress to see if it is too soft. A firm mattress may lessen your pain and discomfort. SEEK IMMEDIATE MEDICAL CARE IF:   You lose control of your bowel or bladder (incontinence).  You have increasing weakness in the lower back, pelvis, buttocks,   or legs.  You have redness or swelling of your back.  You have a burning sensation when you urinate.  You have pain that gets worse when you lie down or awakens you at night.  Your pain is worse than you have experienced in the past.  Your pain is lasting longer than 4 weeks.  You are suddenly losing weight without reason. MAKE SURE YOU:  Understand these  instructions.  Will watch your condition.  Will get help right away if you are not doing well or get worse.   This information is not intended to replace advice given to you by your health care provider. Make sure you discuss any questions you have with your health care provider.   Document Released: 01/18/2001 Document Revised: 10/15/2014 Document Reviewed: 06/05/2011 Elsevier Interactive Patient Education 2016 [employer]   ++++++++++++++++++++++++++++++++++++++++++++++++++ Prostatitis The prostate gland is about the size and shape of a walnut. It is located just below your bladder. It produces one of the components of semen, which is made up of sperm and the fluids that help nourish and transport it out from the testicles. Prostatitis is inflammation of the prostate gland.  There are four types of prostatitis:  Acute bacterial prostatitis. This is the least common type of prostatitis. It starts quickly and usually is associated with a bladder infection, high fever, and shaking chills. It can occur at any age.  Chronic bacterial prostatitis. This is a persistent bacterial infection in the prostate. It usually develops from repeated acute bacterial prostatitis or acute bacterial prostatitis that was not properly treated. It can occur in men of any age but is most common in middle-aged men whose prostate has begun to enlarge. The symptoms are not as severe as those in acute bacterial prostatitis. Discomfort in the part of your body that is in front of your rectum and below your scrotum (perineum), lower abdomen, or in the head of your penis (glans) may represent your primary discomfort.  Chronic prostatitis (nonbacterial). This is the most common type of prostatitis. It is inflammation of the prostate gland that is not caused by a bacterial infection. The cause is unknown and may be associated with a viral infection or autoimmune disorder.  Prostatodynia (pelvic floor disorder). This is  associated with increased muscular tone in the pelvis surrounding the prostate. CAUSES The causes of bacterial prostatitis are bacterial infection. The causes of the other types of prostatitis are unknown.  SYMPTOMS  Symptoms can vary depending upon the type of prostatitis that exists. There can also be overlap in symptoms. Possible symptoms for each type of prostatitis are listed below. Acute Bacterial Prostatitis  Painful urination.  Fever or chills.  Muscle or joint pains.  Low back pain.  Low abdominal pain.  Inability to empty bladder completely. Chronic Bacterial Prostatitis, Chronic Nonbacterial Prostatitis, and Prostatodynia  Sudden urge to urinate.  Frequent urination.  Difficulty starting urine stream.  Weak urine stream.  Discharge from the urethra.  Dribbling after urination.  Rectal pain.  Pain in the testicles, penis, or tip of the penis.  Pain in the perineum.  Problems with sexual function.  Painful ejaculation.  Bloody semen. DIAGNOSIS  In order to diagnose prostatitis, your health care provider will ask about your symptoms. One or more urine samples will be taken and tested (urinalysis). If the urinalysis result is negative for bacteria, your health care provider may use a finger to feel your prostate (digital rectal exam). This exam helps your health care provider determine if  your prostate is swollen and tender. It will also produce a specimen of semen that can be analyzed. TREATMENT  Treatment for prostatitis depends on the cause. If a bacterial infection is the cause, it can be treated with antibiotic medicine. In cases of chronic bacterial prostatitis, the use of antibiotics for up to 1 month or 6 weeks may be necessary. Your health care provider may instruct you to take sitz baths to help relieve pain. A sitz bath is a bath of hot water in which your hips and buttocks are under water. This relaxes the pelvic floor muscles and often helps to  relieve the pressure on your prostate. HOME CARE INSTRUCTIONS   Take all medicines as directed by your health care provider.  Take sitz baths as directed by your health care provider. SEEK MEDICAL CARE IF:   Your symptoms get worse, not better.  You have a fever. SEEK IMMEDIATE MEDICAL CARE IF:   You have chills.  You feel nauseous or vomit.  You feel lightheaded or faint.  You are unable to urinate.  You have blood or blood clots in your urine. MAKE SURE YOU:  Understand these instructions.  Will watch your condition.  Will get help right away if you are not doing well or get worse.

## 2015-09-17 LAB — PSA, TOTAL AND FREE
PSA, % FREE: 18 % — AB (ref >25)
PSA, FREE: 1.2 ng/mL
PSA, TOTAL: 6.6 ng/mL — AB (ref <=4.0)

## 2015-09-17 LAB — URINALYSIS, ROUTINE W REFLEX MICROSCOPIC
BILIRUBIN URINE: NEGATIVE
Glucose, UA: NEGATIVE
Hgb urine dipstick: NEGATIVE
KETONES UR: NEGATIVE
Nitrite: NEGATIVE
PROTEIN: NEGATIVE
Specific Gravity, Urine: 1.016 (ref 1.001–1.035)
pH: 7 (ref 5.0–8.0)

## 2015-09-17 LAB — URINALYSIS, MICROSCOPIC ONLY
BACTERIA UA: NONE SEEN [HPF]
CASTS: NONE SEEN [LPF]
CRYSTALS: NONE SEEN [HPF]
RBC / HPF: NONE SEEN RBC/HPF (ref <=2)
YEAST: NONE SEEN [HPF]

## 2015-09-17 LAB — URINE CULTURE: Organism ID, Bacteria: NO GROWTH

## 2015-09-22 ENCOUNTER — Other Ambulatory Visit: Payer: Self-pay | Admitting: Internal Medicine

## 2015-09-22 DIAGNOSIS — R972 Elevated prostate specific antigen [PSA]: Secondary | ICD-10-CM

## 2015-09-22 DIAGNOSIS — N411 Chronic prostatitis: Secondary | ICD-10-CM

## 2015-09-22 MED ORDER — SULFAMETHOXAZOLE-TRIMETHOPRIM 800-160 MG PO TABS: ORAL_TABLET | ORAL | 0 refills | Status: AC

## 2015-09-29 ENCOUNTER — Other Ambulatory Visit: Payer: Self-pay | Admitting: Internal Medicine

## 2015-09-29 DIAGNOSIS — F419 Anxiety disorder, unspecified: Secondary | ICD-10-CM

## 2015-10-29 ENCOUNTER — Encounter: Payer: Self-pay | Admitting: Internal Medicine

## 2015-10-29 ENCOUNTER — Ambulatory Visit (INDEPENDENT_AMBULATORY_CARE_PROVIDER_SITE_OTHER): Payer: 59 | Admitting: Internal Medicine

## 2015-10-29 VITALS — BP 118/70 | HR 102 | Temp 98.2°F | Resp 18 | Ht 71.0 in | Wt 208.0 lb

## 2015-10-29 DIAGNOSIS — M533 Sacrococcygeal disorders, not elsewhere classified: Secondary | ICD-10-CM | POA: Diagnosis not present

## 2015-10-29 DIAGNOSIS — H6123 Impacted cerumen, bilateral: Secondary | ICD-10-CM

## 2015-10-29 NOTE — Progress Notes (Signed)
Subjective:    Patient ID: [name], male    DOB: 16-Dec-1955, 60 y.o.   MRN: [id number]  HPI  Patient presents to the office for evaluation of prostate issues.  He reports that when he was here for his physical he reports that he had an elevated PSA from possible prostate infection.  He reports that he did take bactrim for a month.  He reports that he had another elevated PSA and they recommended that he potentially get a urology evaluation.  He saw them and they retested a PSA and then he determined that he should get a biopsy of the prostate.    He reports that the right ear has been bothering him for a week.  He reports that it has felt clogged.  He did put peroxide in it.  He has not been submerged in any water.  He has had some nasal congestion and some headache and sinus pressure.   He reports that he is having some low back pain and sacral pain that has been going on for the past month.  He reports its like a dull tooth ache.  He does have worsening pain at nighttime.  He reports that pain does go down the left leg.  He reports that it goes to the left knee.  He does occasionally take regular asprin with some relief.  He reports that this does help some.  He does not have any pain with urinating and no blood in his urine. He does have weak stream and frequent nighttime urination.  He has had an issue with some irritation with the scar tissue asscociated pilonidal removal remotely.  He reports that this pain is similar to when he has had sciatica, but it is more cramping than numbness or tingling.  No electric sensations.    Review of Systems  Constitutional: Negative for chills, fatigue and fever.  HENT: Positive for ear pain. Negative for congestion, dental problem, facial swelling, postnasal drip, rhinorrhea and tinnitus.   Gastrointestinal: Negative for abdominal pain, constipation, diarrhea, nausea and vomiting.  Musculoskeletal: Positive for back pain.  Neurological: Negative  for tremors and numbness.       Objective:   Physical Exam  Constitutional: He is oriented to person, place, and time. He appears well-developed and well-nourished. No distress.  HENT:  Head: Normocephalic.  Mouth/Throat: Oropharynx is clear and moist. No oropharyngeal exudate.  Eyes: Conjunctivae are normal. No scleral icterus.  Neck: Normal range of motion. Neck supple. No JVD present. No thyromegaly present.  Cardiovascular: Normal rate, regular rhythm, normal heart sounds and intact distal pulses.  Exam reveals no gallop and no friction rub.   No murmur heard. Pulmonary/Chest: Effort normal and breath sounds normal. No respiratory distress. He has no wheezes. He has no rales. He exhibits no tenderness.  Abdominal: Soft. Bowel sounds are normal. He exhibits no distension and no mass. There is no tenderness. There is no rebound and no guarding.  Musculoskeletal: Normal range of motion.  Patient rises slowly from sitting to standing.  They walk without an antalgic gait.  There is no evidence of erythema, ecchymosis, or gross deformity.  There is no tenderness to palpation over the bilateral lumbar spine, coccyx.  Mild tenderness to palpation of the left SI.  Active ROM is full.  Sensation to light touch is intact over all extremities.  Strength is symmetric and equal in all extremities.      Lymphadenopathy:    He has no cervical adenopathy.  Neurological: He is alert and oriented to person, place, and time. No cranial nerve deficit. Coordination normal.  Skin: Skin is warm and dry. No rash noted. He is not diaphoretic.  Psychiatric: He has a normal mood and affect. His behavior is normal. Judgment and thought content normal.  Nursing note and vitals reviewed.   Vitals:   10/29/15 1605  BP: 118/70  Pulse: (!) 102  Resp: 18  Temp: 98.2 F (36.8 C)          Assessment & Plan:    1. Coccyodynia -Tylenol 1,000 mg TID - DG Sacrum/Coccyx; Future -no red flag  symptoms -patient is due to have biopsy of the prostate -no likely infection as just finished 1 month of bactrim  2.  Ear wax impaction -tried ear lavage here which patient didn't tolerate -can send to ENT

## 2015-12-01 ENCOUNTER — Encounter: Payer: Self-pay | Admitting: Internal Medicine

## 2015-12-01 ENCOUNTER — Ambulatory Visit (INDEPENDENT_AMBULATORY_CARE_PROVIDER_SITE_OTHER): Payer: 59 | Admitting: Internal Medicine

## 2015-12-01 VITALS — BP 136/70 | HR 88 | Temp 98.4°F | Resp 16 | Ht 71.0 in | Wt 215.0 lb

## 2015-12-01 DIAGNOSIS — I10 Essential (primary) hypertension: Secondary | ICD-10-CM

## 2015-12-01 DIAGNOSIS — R7303 Prediabetes: Secondary | ICD-10-CM | POA: Diagnosis not present

## 2015-12-01 DIAGNOSIS — E039 Hypothyroidism, unspecified: Secondary | ICD-10-CM

## 2015-12-01 DIAGNOSIS — R972 Elevated prostate specific antigen [PSA]: Secondary | ICD-10-CM | POA: Diagnosis not present

## 2015-12-01 DIAGNOSIS — E559 Vitamin D deficiency, unspecified: Secondary | ICD-10-CM | POA: Diagnosis not present

## 2015-12-01 DIAGNOSIS — Z79899 Other long term (current) drug therapy: Secondary | ICD-10-CM

## 2015-12-01 DIAGNOSIS — E782 Mixed hyperlipidemia: Secondary | ICD-10-CM

## 2015-12-01 LAB — CBC WITH DIFFERENTIAL/PLATELET
BASOS PCT: 1 %
Basophils Absolute: 70 cells/uL (ref 0–200)
EOS ABS: 280 {cells}/uL (ref 15–500)
EOS PCT: 4 %
HCT: 41.2 % (ref 38.5–50.0)
Hemoglobin: 13.8 g/dL (ref 13.2–17.1)
Lymphocytes Relative: 18 %
Lymphs Abs: 1260 cells/uL (ref 850–3900)
MCH: 30.9 pg (ref 27.0–33.0)
MCHC: 33.5 g/dL (ref 32.0–36.0)
MCV: 92.2 fL (ref 80.0–100.0)
MONOS PCT: 10 %
MPV: 11.8 fL (ref 7.5–12.5)
Monocytes Absolute: 700 cells/uL (ref 200–950)
NEUTROS ABS: 4690 {cells}/uL (ref 1500–7800)
Neutrophils Relative %: 67 %
PLATELETS: 283 10*3/uL (ref 140–400)
RBC: 4.47 MIL/uL (ref 4.20–5.80)
RDW: 14.2 % (ref 11.0–15.0)
WBC: 7 10*3/uL (ref 3.8–10.8)

## 2015-12-01 LAB — HEPATIC FUNCTION PANEL
ALBUMIN: 3.9 g/dL (ref 3.6–5.1)
ALK PHOS: 47 U/L (ref 40–115)
ALT: 14 U/L (ref 9–46)
AST: 14 U/L (ref 10–35)
BILIRUBIN TOTAL: 0.4 mg/dL (ref 0.2–1.2)
Bilirubin, Direct: 0.1 mg/dL (ref <=0.2)
Indirect Bilirubin: 0.3 mg/dL (ref 0.2–1.2)
TOTAL PROTEIN: 5.8 g/dL — AB (ref 6.1–8.1)

## 2015-12-01 LAB — BASIC METABOLIC PANEL WITH GFR
BUN: 17 mg/dL (ref 7–25)
CO2: 24 mmol/L (ref 20–31)
CREATININE: 1.19 mg/dL (ref 0.70–1.25)
Calcium: 9.4 mg/dL (ref 8.6–10.3)
Chloride: 107 mmol/L (ref 98–110)
GFR, EST AFRICAN AMERICAN: 76 mL/min (ref >=60)
GFR, Est Non African American: 66 mL/min (ref >=60)
GLUCOSE: 97 mg/dL (ref 65–99)
POTASSIUM: 4.7 mmol/L (ref 3.5–5.3)
Sodium: 139 mmol/L (ref 135–146)

## 2015-12-01 LAB — PSA: PSA: 1.9 ng/mL (ref <=4.0)

## 2015-12-01 NOTE — Progress Notes (Signed)
Assessment and Plan:  Hypertension:  -Continue medication,  -monitor blood pressure at home.  -Continue DASH diet.   -Reminder to go to the ER if any CP, SOB, nausea, dizziness, severe HA, changes vision/speech, left arm numbness and tingling, and jaw pain.  Cholesterol: -Continue diet and exercise.  -Check cholesterol.   Pre-diabetes: -Continue diet and exercise.  -Check A1C  Vitamin D Def: -check level -continue medications.   Elevated PSA -recommended that patient continue to follow with urology for biopsy regardless of the PSA results  -patient was unable to tolerate rapaflo  Continue diet and meds as discussed. Further disposition pending results of labs.  HPI 60 y.o. male  presents for 3 month follow up with hypertension, hyperlipidemia, prediabetes and vitamin D.   His blood pressure has been controlled at home, today their BP is BP: 136/70.   He does workout. He denies chest pain, shortness of breath, dizziness.   He is on cholesterol medication and denies myalgias. His cholesterol is at goal. The cholesterol last visit was:   Lab Results  Component Value Date   CHOL 200 08/18/2015   HDL 46 08/18/2015   LDLCALC 121 08/18/2015   LDLDIRECT 190.6 07/30/2010   TRIG 163 (H) 08/18/2015   CHOLHDL 4.3 08/18/2015     He has been working on diet and exercise for prediabetes, and denies foot ulcerations, hyperglycemia, hypoglycemia , increased appetite, nausea, paresthesia of the feet, polydipsia, polyuria, visual disturbances, vomiting and weight loss. Last A1C in the office was:  Lab Results  Component Value Date   HGBA1C 5.9 (H) 08/18/2015    Patient is on Vitamin D supplement.  Lab Results  Component Value Date   VD25OH 76 08/18/2015     Patient reports that he went to urology for elevated PSA level.  He reports that he has been having elevated PSA levels over 6.  He reports that they gave him a sample of rapaflo which he didn't take.  He reports that he did try  the medication but he had some pain in his legs and some cramping.  He is supposed to have a biopsy done on November the 10th.  He would like to have his PSA level checked again today.    Current Medications:  Current Outpatient Prescriptions on File Prior to Visit  Medication Sig Dispense Refill  . ALPRAZolam (XANAX) 0.5 MG tablet TAKE 1/2 TO 1 TABLET BY MOUTH 2-3 TIMES DAILY AS NEEDED FOR ANXIETY 90 tablet 3  . aspirin EC 81 MG tablet 2 tabs po qd    . atorvastatin (LIPITOR) 80 MG tablet TAKE ONE TABLET BY MOUTH ONE TIME DAILY FOR CHOLESTEROL 90 tablet 1  . Cholecalciferol (VITAMIN D-3) 5000 UNITS TABS Take 1 tablet by mouth daily.    Marland Kitchen esomeprazole (NEXIUM) 20 MG capsule Take 20 mg by mouth daily at 12 noon. Takes OTC 20 mg    . lisinopril (PRINIVIL,ZESTRIL) 10 MG tablet TAKE ONE TABLET BY MOUTH ONE TIME DAILY. ONLY TAKE WHEN BLOOD PRESSURE IS HIGH. 90 tablet 1  . sildenafil (REVATIO) 20 MG tablet TAKES 1 TO 5 TABLETS DAILY AS NEEDED. 30 tablet 0  . sildenafil (VIAGRA) 100 MG tablet Take 1 tablet (100 mg total) by mouth as needed for erectile dysfunction. 9 tablet 1   No current facility-administered medications on file prior to visit.     Medical History:  Past Medical History:  Diagnosis Date  . ANXIETY 12/25/2007  . ANXIETY STATE NEC 11/01/2006  . CORONARY ARTERY  DISEASE 08/02/2006   off Plavix x 3 years  . DIVERTICULITIS OF COLON 01/06/2010  . DVT, HX OF 08/02/2006  . 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 HYPERLIPIDEMIA 08/02/2006  . HYPERTENSION 08/05/2008  . Memory loss 07/21/2009  . Pilonidal cyst    low back pain    Allergies:  Allergies  Allergen Reactions  . Penicillins     unspecified     Review of Systems:  Review of Systems  Constitutional: Negative for chills, fever and malaise/fatigue.  HENT: Negative for congestion, ear pain and sore throat.   Eyes: Negative.   Respiratory: Negative for cough, shortness of breath and wheezing.   Cardiovascular: Negative for  chest pain, palpitations and leg swelling.  Gastrointestinal: Negative for abdominal pain, blood in stool, constipation, diarrhea, heartburn and melena.  Genitourinary: Negative.   Skin: Negative.   Neurological: Negative for dizziness, sensory change, loss of consciousness and headaches.  Psychiatric/Behavioral: Negative for depression. The patient is not nervous/anxious and does not have insomnia.     Family history- Review and unchanged  Social history- Review and unchanged  Physical Exam: BP 136/70   Pulse 88   Temp 98.4 F (36.9 C) (Temporal)   Resp 16   Ht 5\' 11"  (1.803 m)   Wt 215 lb (97.5 kg)   BMI 29.99 kg/m  Wt Readings from Last 3 Encounters:  12/01/15 215 lb (97.5 kg)  10/29/15 208 lb (94.3 kg)  09/16/15 206 lb 9.6 oz (93.7 kg)    General Appearance: Well nourished well developed, in no apparent distress. Eyes: PERRLA, EOMs, conjunctiva no swelling or erythema ENT/Mouth: Ear canals normal without obstruction, swelling, erythma, discharge.  TMs normal bilaterally.  Oropharynx moist, clear, without exudate, or postoropharyngeal swelling. Neck: Supple, thyroid normal,no cervical adenopathy  Respiratory: Respiratory effort normal, Breath sounds clear A&P without rhonchi, wheeze, or rale.  No retractions, no accessory usage. Cardio: RRR with no MRGs. Brisk peripheral pulses without edema.  Abdomen: Soft, + BS,  Non tender, no guarding, rebound, hernias, masses. Musculoskeletal: Full ROM, 5/5 strength, Normal gait Skin: Warm, dry without rashes, lesions, ecchymosis.  Neuro: Awake and oriented X 3, Cranial nerves intact. Normal muscle tone, no cerebellar symptoms. Psych: Normal affect, Insight and Judgment appropriate.    Starlyn Skeans, PA-C 9:53 AM [facility]

## 2015-12-24 ENCOUNTER — Ambulatory Visit (HOSPITAL_COMMUNITY)
Admission: RE | Admit: 2015-12-24 | Discharge: 2015-12-24 | Disposition: A | Discharge status: Home / Self Care | Payer: 59 | Source: Ambulatory Visit | Attending: Internal Medicine | Admitting: Internal Medicine

## 2015-12-24 DIAGNOSIS — M533 Sacrococcygeal disorders, not elsewhere classified: Secondary | ICD-10-CM | POA: Insufficient documentation

## 2015-12-27 ENCOUNTER — Other Ambulatory Visit: Payer: Self-pay | Admitting: Internal Medicine

## 2015-12-29 ENCOUNTER — Ambulatory Visit (INDEPENDENT_AMBULATORY_CARE_PROVIDER_SITE_OTHER): Payer: 59 | Admitting: Internal Medicine

## 2015-12-29 ENCOUNTER — Encounter: Payer: Self-pay | Admitting: Internal Medicine

## 2015-12-29 VITALS — BP 112/78 | HR 76 | Temp 97.5°F | Resp 16 | Ht 71.0 in | Wt 207.6 lb

## 2015-12-29 DIAGNOSIS — R972 Elevated prostate specific antigen [PSA]: Secondary | ICD-10-CM

## 2015-12-29 DIAGNOSIS — M5432 Sciatica, left side: Secondary | ICD-10-CM

## 2015-12-29 MED ORDER — PREDNISONE 20 MG PO TABS: ORAL_TABLET | ORAL | 0 refills | Status: DC

## 2015-12-29 NOTE — Progress Notes (Signed)
[facility]   Unk Pinto, M.D.    Uvaldo Bristle. Silverio Lay, P.A.-C      Starlyn Skeans, P.A.-C  [facility]                [address] [place], [place] [id number] Telephone [phone] Telefax [phone] Subjective:    Patient ID: [name], male    DOB: 02/24/1955, 60 y.o.   MRN: [id number]  HPI  This 60 yo MWM with HTN, HLD, PreDM, Vit D Deficiency and hx of elevated PSA  Presents today with c/o vague discomfort in the left buttock  ? rectal area and sx's intermittent cramping of the lateral Left foot. Patient has had several recent elevated PSA's in the 6 range and was seen by Dr Diona Fanti and had a free/total PSA done. Patient relate most recent PSA after Abx tx was down to 1.9 last week. He does endorse  LUTS and  nocturia x 5-6. He also has hx/o LBP and sciatica in the past, but not well defined today. He relates a sensation that "there is something stuck inside of me".  Medication Sig  . ALPRAZolam  0.5 MG  TAKE 1/2 TO 1 TAB 2-3 TIMES DAILY AS NEEDED FOR ANXIETY  . aspirin EC 81 MG  2 tabs po qd  . Atorvastatin 80 MG TAKE ONE TAB DAILY   . VITAMIN D 5000 UNITS  Take 1 tablet by mouth daily.  Marland Kitchen esomeprazole  20 MG  Takes OTC 20 mg  . lisinopril  10 MG tablet TAKE ONE TAB  DAILY  . sildenafil  20 MG tablet TAKES 1 TO 5 TAB DAILY AS NEEDED.  Marland Kitchen sildenafil  100 MG tablet Take 1 tab as needed for erectile dysfunction.   Allergies  Allergen Reactions  . Penicillins     unspecified   Past Medical History:  Diagnosis Date  . ANXIETY 12/25/2007  . ANXIETY STATE NEC 11/01/2006  . CORONARY ARTERY DISEASE 08/02/2006   off Plavix x 3 years  . DIVERTICULITIS OF COLON 01/06/2010  . DVT, HX OF 08/02/2006  . 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 HYPERLIPIDEMIA 08/02/2006  . HYPERTENSION 08/05/2008  . Memory loss 07/21/2009  . Pilonidal cyst    low back pain   Past Surgical History:  Procedure  Laterality Date  . COLONOSCOPY W/ POLYPECTOMY  06/16/2010   diminutive adenoma, diverticulosis, hemorrhoids  . CORONARY ANGIOPLASTY WITH STENT PLACEMENT    . ESOPHAGOGASTRODUODENOSCOPY  06/16/2010   antral ulcers, esophageal stricture - dilated, duodenitis, hiatal hernia  . PILONIDAL CYST EXCISION     teenager   Review of Systems  10 point systems review negative except as above.    Objective:   Physical Exam  BP 112/78   Pulse 76   Temp 97.5 F (36.4 C)   Resp 16   Ht 5\' 11"  (1.803 m)   Wt 207 lb 9.6 oz (94.2 kg)   BMI 28.95 kg/m   HEENT - Eac's patent. TM's Nl. EOM's full. PERRLA. NasoOroPharynx clear. Neck - supple. Nl Thyroid. Carotids 2+ & No bruits, nodes, JVD Chest - Clear equal BS w/o Rales, rhonchi, wheezes. Cor - Nl HS. RRR w/o sig MGR. PP 1(+). No edema. Abd - No palpable organomegaly, masses or tenderness. BS nl. DRE - 2-3 (+) BPH - smooth firm & non-tender.   (-) Hemoccult MS- FROM w/o deformities. (-)  SLR and Hip Fig 4. No trochanteric or ishial tenderness. Muscle power, tone and bulk Nl. Gait Nl. Neuro - No obvious Cr N abnormalities. Sensory, motor and Cerebellar functions appear Nl w/o focal abnormalities.    Assessment & Plan:   1. Elevated PSA  - Urinalysis, Routine w reflex microscopic  - Urine culture - PSA, Total and Free  2. Sciatica of left side  - Empiric tx with  - predniSONE (DELTASONE) 20 MG tablet; 1 tab 3 x day for 3 days, then 1 tab 2 x day for 3 days, then 1 tab 1 x day for 5 days  Dispense: 20 tablet; Refill: 0

## 2015-12-29 NOTE — Patient Instructions (Signed)
Prostate-Specific Antigen Test Why am I having this test? The prostate-specific antigen (PSA) test is performed to determine how much PSA you have in your blood. PSA is a type of protein that is normally present in the prostate gland. Certain conditions can cause PSA blood levels to increase, such as:  Infection in the prostate (prostatitis).  Enlargement of the prostate (hypertrophy).  Prostate cancer. Because PSA levels increase greatly from prostate cancer, this test can be used to confirm a diagnosis of prostate cancer. It may also be used to monitor treatment for prostate cancer and to watch for a return of prostate cancer after treatment has finished. This test has a very high false-positive rate. Therefore, routine PSA screening for all men is no longer recommended. A false-positive result is incorrect because it indicates a condition or finding is present when it is not. What kind of sample is taken? A blood sample is required for this test. It is usually collected by inserting a needle into a vein or by sticking a finger with a small needle. How do I prepare for this test? There is no preparation required for this test. However, there are factors that can affect the results of a PSA test. To get the most accurate results:  Avoid having a rectal exam within several hours before having your blood drawn for this test.  Avoid having any procedures performed on the prostate gland within 6 weeks of having this test.  Avoid ejaculating within 24 hours of having this test.  Tell your health care provider if you had a recent urinary tract infection (UTI).  Tell your health care provider if you are taking medicines to assist with hair growth, such as finasteride.  Tell your health care provider if you have been exposed to a medicine called diethylstilbestrol. Let your health care provider know if any of these factors apply to you. You may be asked to reschedule the test. What are the  reference ranges? Reference ranges are established after testing a large group of people. Reference ranges may vary among different people, labs, and hospitals. It is your responsibility to obtain your test results. Ask the lab or department performing the test when and how you will get your results.  Low: 0-2.5 ng/mL.  Slightly to moderately elevated: 2.6-10.0 ng/mL.  Moderately elevated: 10.0-19.9 ng/mL.  Significantly elevated: 20 ng/mL or greater. What do the results mean? PSA test results greater than 4 ng/mL are found in the majority of men with prostate cancer. If your test result is above this level, this can indicate an increased risk for prostate cancer. Increased PSA levels can also indicate other health conditions. Talk with your health care provider to discuss your results, treatment options, and if necessary, the need for more tests. Talk with your health care provider if you have any questions about your results. Talk with your health care provider to discuss your results, treatment options, and if necessary, the need for more tests. Talk with your health care provider if you have any questions about your results. This information is not intended to replace advice given to you by your health care provider. Make sure you discuss any questions you have with your health care provider. Document Released: 02/27/2004 Document Revised: 09/30/2015 Document Reviewed: 06/19/2013 Elsevier Interactive Patient Education  2017 [employer]  

## 2015-12-30 LAB — URINALYSIS, ROUTINE W REFLEX MICROSCOPIC
BILIRUBIN URINE: NEGATIVE
Glucose, UA: NEGATIVE
HGB URINE DIPSTICK: NEGATIVE
KETONES UR: NEGATIVE
Leukocytes, UA: NEGATIVE
Nitrite: NEGATIVE
Protein, ur: NEGATIVE
SPECIFIC GRAVITY, URINE: 1.024 (ref 1.001–1.035)
pH: 6 (ref 5.0–8.0)

## 2015-12-30 LAB — PSA, TOTAL AND FREE
PSA, % Free: 22 % — ABNORMAL LOW (ref >25)
PSA, Free: 1 ng/mL
PSA, Total: 4.6 ng/mL — ABNORMAL HIGH (ref <=4.0)

## 2015-12-30 LAB — URINE CULTURE: ORGANISM ID, BACTERIA: NO GROWTH

## 2016-02-06 ENCOUNTER — Other Ambulatory Visit: Payer: Self-pay | Admitting: Internal Medicine

## 2016-02-06 DIAGNOSIS — F419 Anxiety disorder, unspecified: Secondary | ICD-10-CM

## 2016-02-06 NOTE — Telephone Encounter (Signed)
Please call Alprazolam 

## 2016-02-12 ENCOUNTER — Other Ambulatory Visit: Payer: Self-pay | Admitting: Internal Medicine

## 2016-03-04 ENCOUNTER — Ambulatory Visit: Payer: Self-pay | Admitting: Internal Medicine

## 2016-03-04 NOTE — Progress Notes (Signed)
NO SHOW

## 2016-03-29 ENCOUNTER — Ambulatory Visit (INDEPENDENT_AMBULATORY_CARE_PROVIDER_SITE_OTHER): Payer: 59 | Admitting: Internal Medicine

## 2016-03-29 ENCOUNTER — Encounter: Payer: Self-pay | Admitting: Internal Medicine

## 2016-03-29 VITALS — BP 120/76 | HR 76 | Temp 97.7°F | Resp 16 | Ht 71.0 in | Wt 206.8 lb

## 2016-03-29 DIAGNOSIS — G8929 Other chronic pain: Secondary | ICD-10-CM

## 2016-03-29 DIAGNOSIS — I25810 Atherosclerosis of coronary artery bypass graft(s) without angina pectoris: Secondary | ICD-10-CM

## 2016-03-29 DIAGNOSIS — E782 Mixed hyperlipidemia: Secondary | ICD-10-CM

## 2016-03-29 DIAGNOSIS — M5442 Lumbago with sciatica, left side: Secondary | ICD-10-CM | POA: Diagnosis not present

## 2016-03-29 DIAGNOSIS — R7303 Prediabetes: Secondary | ICD-10-CM | POA: Diagnosis not present

## 2016-03-29 DIAGNOSIS — I10 Essential (primary) hypertension: Secondary | ICD-10-CM

## 2016-03-29 DIAGNOSIS — Z79899 Other long term (current) drug therapy: Secondary | ICD-10-CM

## 2016-03-29 DIAGNOSIS — E559 Vitamin D deficiency, unspecified: Secondary | ICD-10-CM | POA: Diagnosis not present

## 2016-03-29 DIAGNOSIS — E039 Hypothyroidism, unspecified: Secondary | ICD-10-CM | POA: Diagnosis not present

## 2016-03-29 LAB — CBC WITH DIFFERENTIAL/PLATELET
BASOS ABS: 0 {cells}/uL (ref 0–200)
Basophils Relative: 0 %
EOS ABS: 79 {cells}/uL (ref 15–500)
Eosinophils Relative: 1 %
HEMATOCRIT: 44.5 % (ref 38.5–50.0)
Hemoglobin: 15.2 g/dL (ref 13.2–17.1)
Lymphocytes Relative: 16 %
Lymphs Abs: 1264 cells/uL (ref 850–3900)
MCH: 30.8 pg (ref 27.0–33.0)
MCHC: 34.2 g/dL (ref 32.0–36.0)
MCV: 90.1 fL (ref 80.0–100.0)
MONO ABS: 632 {cells}/uL (ref 200–950)
MONOS PCT: 8 %
MPV: 11.2 fL (ref 7.5–12.5)
NEUTROS PCT: 75 %
Neutro Abs: 5925 cells/uL (ref 1500–7800)
PLATELETS: 347 10*3/uL (ref 140–400)
RBC: 4.94 MIL/uL (ref 4.20–5.80)
RDW: 14.1 % (ref 11.0–15.0)
WBC: 7.9 10*3/uL (ref 3.8–10.8)

## 2016-03-29 LAB — HEPATIC FUNCTION PANEL
ALBUMIN: 4.4 g/dL (ref 3.6–5.1)
ALT: 22 U/L (ref 9–46)
AST: 21 U/L (ref 10–35)
Alkaline Phosphatase: 48 U/L (ref 40–115)
BILIRUBIN TOTAL: 0.5 mg/dL (ref 0.2–1.2)
Bilirubin, Direct: 0.1 mg/dL (ref <=0.2)
Indirect Bilirubin: 0.4 mg/dL (ref 0.2–1.2)
TOTAL PROTEIN: 6.9 g/dL (ref 6.1–8.1)

## 2016-03-29 LAB — LIPID PANEL
Cholesterol: 193 mg/dL (ref <200)
HDL: 44 mg/dL (ref >40)
LDL CALC: 118 mg/dL — AB (ref <100)
TRIGLYCERIDES: 156 mg/dL — AB (ref <150)
Total CHOL/HDL Ratio: 4.4 Ratio (ref <5.0)
VLDL: 31 mg/dL — ABNORMAL HIGH (ref <30)

## 2016-03-29 LAB — BASIC METABOLIC PANEL WITH GFR
BUN: 16 mg/dL (ref 7–25)
CO2: 24 mmol/L (ref 20–31)
CREATININE: 1.24 mg/dL (ref 0.70–1.25)
Calcium: 9.9 mg/dL (ref 8.6–10.3)
Chloride: 103 mmol/L (ref 98–110)
GFR, EST AFRICAN AMERICAN: 72 mL/min (ref >=60)
GFR, Est Non African American: 62 mL/min (ref >=60)
GLUCOSE: 91 mg/dL (ref 65–99)
POTASSIUM: 4.6 mmol/L (ref 3.5–5.3)
Sodium: 139 mmol/L (ref 135–146)

## 2016-03-29 LAB — TSH: TSH: 2.98 m[IU]/L (ref 0.40–4.50)

## 2016-03-29 MED ORDER — PREDNISONE 20 MG PO TABS: ORAL_TABLET | ORAL | 0 refills | Status: DC

## 2016-03-29 NOTE — Progress Notes (Signed)
[facility] Unk Pinto, M.D.        Uvaldo Bristle. Silverio Lay, P.A.-C       Starlyn Skeans, P.A.-C  [facility]                [address] [place], [place] [phone] Telephone [phone] Telefax [phone] ______________________________________________________________________     This very nice 61 y.o. MWM presents for 6 month follow up with Hypertension, Hyperlipidemia, Pre-Diabetes and Vitamin D Deficiency.      Patient continues to c/o intermittent LBP and Left Sciatica that is occasionally disabling making walking very painful or difficult.      Patient is treated for HTN & BP has been controlled at home. Today's BP is  120/76. Patient has had no complaints of any cardiac type chest pain, palpitations, dyspnea/orthopnea/PND, dizziness, claudication, or dependent edema.     Hyperlipidemia is controlled with diet & meds. Patient denies myalgias or other med SE's. Last Lipids were not at goal: Lab Results  Component Value Date   CHOL 200 08/18/2015   HDL 46 08/18/2015   LDLCALC 121 08/18/2015   TRIG 163 (H) 08/18/2015   CHOLHDL 4.3 08/18/2015      Also, the patient has history of PreDiabetes and has had no symptoms of reactive hypoglycemia, diabetic polys, paresthesias or visual blurring.  Last A1c was not at goal: Lab Results  Component Value Date   HGBA1C 5.9 (H) 08/18/2015      Further, the patient also has history of Vitamin D Deficiency and supplements vitamin D without any suspected side-effects. Last vitamin D was at goal: Lab Results  Component Value Date   VD25OH 76 08/18/2015   Current Outpatient Prescriptions on File Prior to Visit  Medication Sig  . ALPRAZolam  0.5 MG  TAKE 1/2 TO 1 TAB 2-3 TIMES DAILY AS NEEDED   . aspirin EC 81 MG  2 tabs po qd  . atorvastatin  80 MG TAKE ONE TAB DAILY  Sporadically take 2-3 x/week  . VITAMIN D 5000 UNITS Take 1 tablet by mouth daily. Takes  sporadically  . esomeprazole  20 MG  Takes OTC 20 mg  . lisinopril 10 MG  TAKE ONE TABLET BY MOUTH ONE TIME DAILY.   . sildenafil (REVATIO) 20 MG TAKES 1 TO 5 TAB DAILY AS NEEDED.   Allergies  Allergen Reactions  . Penicillins     unspecified   PMHx:   Past Medical History:  Diagnosis Date  . ANXIETY 12/25/2007  . ANXIETY STATE NEC 11/01/2006  . CORONARY ARTERY DISEASE 08/02/2006   off Plavix x 3 years  . DIVERTICULITIS OF COLON 01/06/2010  . DVT, HX OF 08/02/2006  . 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 HYPERLIPIDEMIA 08/02/2006  . HYPERTENSION 08/05/2008  . Memory loss 07/21/2009  . Pilonidal cyst    low back pain   Immunization History  Administered Date(s) Administered  . PPD Test 07/24/2013, 07/30/2014  . Tdap 07/30/2010   Past Surgical History:  Procedure Laterality Date  . COLONOSCOPY W/ POLYPECTOMY  06/16/2010   diminutive adenoma, diverticulosis, hemorrhoids  . CORONARY ANGIOPLASTY WITH STENT PLACEMENT    . ESOPHAGOGASTRODUODENOSCOPY  06/16/2010   antral ulcers, esophageal stricture - dilated, duodenitis, hiatal hernia  . PILONIDAL CYST EXCISION     teenager   FHx:    Reviewed / unchanged  SHx:    Reviewed / unchanged  Systems Review:  Constitutional: Denies fever, chills, wt changes, headaches, insomnia, fatigue, night sweats, change in appetite. Eyes: Denies redness, blurred vision, diplopia, discharge, itchy, watery eyes.  ENT: Denies discharge, congestion, post nasal drip, epistaxis, sore throat, earache, hearing loss, dental pain, tinnitus, vertigo, sinus pain, snoring.  CV: Denies chest pain, palpitations, irregular heartbeat, syncope, dyspnea, diaphoresis, orthopnea, PND, claudication or edema. Respiratory: denies cough, dyspnea, DOE, pleurisy, hoarseness, laryngitis, wheezing.  Gastrointestinal: Denies dysphagia, odynophagia, heartburn, reflux, water brash, abdominal pain or cramps, nausea, vomiting, bloating, diarrhea, constipation, hematemesis, melena,  hematochezia  or hemorrhoids. Genitourinary: Denies dysuria, frequency, urgency, nocturia, hesitancy, discharge, hematuria or flank pain. Musculoskeletal: Denies arthralgias, myalgias, stiffness, jt. swelling, pain, limping or strain/sprain.  Skin: Denies pruritus, rash, hives, warts, acne, eczema or change in skin lesion(s). Neuro: No weakness, tremor, incoordination, spasms, paresthesia or pain. Psychiatric: Denies confusion, memory loss or sensory loss. Endo: Denies change in weight, skin or hair change.  Heme/Lymph: No excessive bleeding, bruising or enlarged lymph nodes.  Physical Exam  BP 120/76   Pulse 76   Temp 97.7 F (36.5 C)   Resp 16   Ht 5\' 11"  (1.803 m)   Wt 206 lb 12.8 oz (93.8 kg)   BMI 28.84 kg/m   Appears over nourished and in no distress.  Eyes: PERRLA, EOMs, conjunctiva no swelling or erythema. Sinuses: No frontal/maxillary tenderness ENT/Mouth: EAC's clear, TM's nl w/o erythema, bulging. Nares clear w/o erythema, swelling, exudates. Oropharynx clear without erythema or exudates. Oral hygiene is good. Tongue normal, non obstructing. Hearing intact.  Neck: Supple. Thyroid nl. Car 2+/2+ without bruits, nodes or JVD. Chest: Respirations nl with BS clear & equal w/o rales, rhonchi, wheezing or stridor.  Cor: Heart sounds normal w/ regular rate and rhythm without sig. murmurs, gallops, clicks, or rubs. Peripheral pulses normal and equal  without edema.  Abdomen: Soft & bowel sounds normal. Non-tender w/o guarding, rebound, hernias, masses, or organomegaly.  Lymphatics: Unremarkable.  Musculoskeletal: Full ROM all peripheral extremities, joint stability, 5/5 strength and normal gait. Sl tender Left> Right hip Greater Trochanteric Bursae. Negative /Normal hip figure 4 bilaterally. Neg SLR bilaterally. Skin: Warm, dry without exposed rashes, lesions or ecchymosis apparent.  Neuro: Cranial nerves intact, reflexes equal bilaterally. Sensory-motor testing grossly intact.  Tendon reflexes grossly intact.  Pysch: Alert & oriented x 3.  Insight and judgement nl & appropriate. No ideations.  Assessment and Plan:  1. Essential hypertension  - Continue medication, monitor blood pressure at home.  - Continue DASH diet. Reminder to go to the ER if any CP,  SOB, nausea, dizziness, severe HA, changes vision/speech,  left arm numbness and tingling and jaw pain.  - CBC with Differential/Platelet - BASIC METABOLIC PANEL WITH GFR - TSH  2. Hyperlipidemia  - Continue diet/meds, exercise,& lifestyle modifications.  - Continue monitor periodic cholesterol/liver & renal functions  - Hepatic function panel - Lipid panel - TSH  3. Prediabetes  - Continue diet, exercise, lifestyle modifications.  - Monitor appropriate labs.  - Hemoglobin A1c - Insulin, random  4. Vitamin D deficiency  - Continue supplementation.  - VITAMIN D 25 Hydroxy   5. Hypothyroidism, unspecified type  - TSH  6. 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 Chronic left-sided low back pain with left-sided sciatica  - predniSONE (DELTASONE) 20 MG tablet; 1 tab 3 x day for 2 days, then 1 tab 2 x day for 2 days, then 1 tab 1 x day for  3 days  Dispense: 13 tablet;   - Due to the lonjevity of his pain/sciatica,  will order MR Lumbar Spine Wo Contrast; Future  8. Medication management  - CBC with Differential/Platelet - BASIC METABOLIC PANEL WITH GFR - Hepatic function panel - Magnesium - Lipid panel - TSH - Hemoglobin A1c - Insulin, random - VITAMIN D 25 Hydroxy        Recommended regular exercise, BP monitoring, weight control, and discussed med and SE's. Recommended labs to assess and monitor clinical status. Further disposition pending results of labs. Over 30 minutes of exam, counseling, chart review was performed

## 2016-03-29 NOTE — Patient Instructions (Signed)

## 2016-03-30 LAB — HEMOGLOBIN A1C
Hgb A1c MFr Bld: 5.9 % — ABNORMAL HIGH (ref <5.7)
MEAN PLASMA GLUCOSE: 123 mg/dL

## 2016-03-30 LAB — MAGNESIUM: MAGNESIUM: 1.7 mg/dL (ref 1.5–2.5)

## 2016-03-30 LAB — VITAMIN D 25 HYDROXY (VIT D DEFICIENCY, FRACTURES): VIT D 25 HYDROXY: 70 ng/mL (ref 30–100)

## 2016-03-30 LAB — INSULIN, RANDOM: INSULIN: 15 u[IU]/mL (ref 2.0–19.6)

## 2016-04-20 ENCOUNTER — Other Ambulatory Visit: Payer: 59

## 2016-04-28 ENCOUNTER — Other Ambulatory Visit: Payer: 59

## 2016-05-11 ENCOUNTER — Ambulatory Visit
Admission: RE | Admit: 2016-05-11 | Discharge: 2016-05-11 | Disposition: A | Discharge status: Home / Self Care | Payer: PRIVATE HEALTH INSURANCE | Source: Ambulatory Visit | Attending: Internal Medicine | Admitting: Internal Medicine

## 2016-05-11 DIAGNOSIS — M5442 Lumbago with sciatica, left side: Principal | ICD-10-CM

## 2016-05-11 DIAGNOSIS — G8929 Other chronic pain: Secondary | ICD-10-CM

## 2016-05-27 ENCOUNTER — Other Ambulatory Visit: Payer: Self-pay | Admitting: Internal Medicine

## 2016-05-27 DIAGNOSIS — F419 Anxiety disorder, unspecified: Secondary | ICD-10-CM

## 2016-05-27 NOTE — Telephone Encounter (Signed)
Please call Alpraz  

## 2016-06-30 NOTE — Progress Notes (Signed)
Assessment and Plan:  Hypertension:  -Continue medication,  -monitor blood pressure at home.  -Continue DASH diet.   -Reminder to go to the ER if any CP, SOB, nausea, dizziness, severe HA, changes vision/speech, left arm numbness and tingling, and jaw pain.  Cholesterol: -Continue diet and exercise.  -Check cholesterol.   Pre-diabetes: -Continue diet and exercise.  -Check A1C  Vitamin D Def: -check level -continue medications.   Lower back pain Better now, follow up ortho PRN, declines PT  Continue diet and meds as discussed. Further disposition pending results of labs.  HPI 61 y.o. male  presents for 3 month follow up with hypertension, hyperlipidemia, prediabetes and vitamin D.   His blood pressure has been controlled at home, today their BP is BP: 122/80.   He does workout. He denies chest pain, shortness of breath, dizziness.   He is on cholesterol medication and denies myalgias. His cholesterol is at goal. The cholesterol last visit was:   Lab Results  Component Value Date   CHOL 193 03/29/2016   HDL 44 03/29/2016   LDLCALC 118 (H) 03/29/2016   LDLDIRECT 190.6 07/30/2010   TRIG 156 (H) 03/29/2016   CHOLHDL 4.4 03/29/2016     He has been working on diet and exercise for prediabetes, and denies foot ulcerations, hyperglycemia, hypoglycemia , increased appetite, nausea, paresthesia of the feet, polydipsia, polyuria, visual disturbances, vomiting and weight loss. Last A1C in the office was:  Lab Results  Component Value Date   HGBA1C 5.9 (H) 03/29/2016   He has anxiety, has xanax takes AS needed, on average 2 a week.  He is on the nexium daily.  Patient is on Vitamin D supplement.  Lab Results  Component Value Date   VD25OH 103 03/29/2016     Patient reports that he went to urology for elevated PSA level.  Had MRI lumbar, S1 nerve irritation, back is doing better now. .  Lab Results  Component Value Date   PSA 1.9 12/01/2015   PSA 3.46 08/18/2015   PSA 1.98  07/30/2014   BMI is Body mass index is 29.01 kg/m., he is working on diet and exercise. Wt Readings from Last 3 Encounters:  07/01/16 208 lb (94.3 kg)  03/29/16 206 lb 12.8 oz (93.8 kg)  12/29/15 207 lb 9.6 oz (94.2 kg)     Current Medications:  Current Outpatient Prescriptions on File Prior to Visit  Medication Sig Dispense Refill  . ALPRAZolam (XANAX) 0.5 MG tablet TAKE 1 TABLET BY MOUTH 3 TIMES DAILY AS NEEDED 90 tablet 1  . aspirin EC 81 MG tablet 2 tabs po qd    . atorvastatin (LIPITOR) 80 MG tablet TAKE ONE TABLET BY MOUTH ONE TIME DAILY FOR CHOLESTEROL 90 tablet 1  . Cholecalciferol (VITAMIN D-3) 5000 UNITS TABS Take 1 tablet by mouth daily.    Marland Kitchen esomeprazole (NEXIUM) 20 MG capsule Take 20 mg by mouth daily at 12 noon. Takes OTC 20 mg    . lisinopril (PRINIVIL,ZESTRIL) 10 MG tablet TAKE ONE TABLET BY MOUTH ONE TIME DAILY. ONLY TAKE WHEN BLOOD PRESSURE IS HIGH. 90 tablet 1  . sildenafil (REVATIO) 20 MG tablet TAKES 1 TO 5 TABLETS DAILY AS NEEDED. 30 tablet 0   No current facility-administered medications on file prior to visit.     Medical History:  Past Medical History:  Diagnosis Date  . ANXIETY 12/25/2007  . ANXIETY STATE NEC 11/01/2006  . CORONARY ARTERY DISEASE 08/02/2006   off Plavix x 3 years  .  DIVERTICULITIS OF COLON 01/06/2010  . DVT, HX OF 08/02/2006  . 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 HYPERLIPIDEMIA 08/02/2006  . HYPERTENSION 08/05/2008  . Memory loss 07/21/2009  . Pilonidal cyst    low back pain    Allergies:  Allergies  Allergen Reactions  . Penicillins     unspecified     Review of Systems:  Review of Systems  Constitutional: Negative for chills, fever and malaise/fatigue.  HENT: Negative for congestion, ear pain and sore throat.   Eyes: Negative.   Respiratory: Negative for cough, shortness of breath and wheezing.   Cardiovascular: Negative for chest pain, palpitations and leg swelling.  Gastrointestinal: Negative for abdominal pain, blood in  stool, constipation, diarrhea, heartburn and melena.  Genitourinary: Negative.   Skin: Negative.   Neurological: Negative for dizziness, sensory change, loss of consciousness and headaches.  Psychiatric/Behavioral: Negative for depression. The patient is not nervous/anxious and does not have insomnia.     Family history- Review and unchanged  Social history- Review and unchanged  Physical Exam: BP 122/80   Pulse 87   Temp 97.4 F (36.3 C)   Resp 16   Ht 5\' 11"  (1.803 m)   Wt 208 lb (94.3 kg)   SpO2 97%   BMI 29.01 kg/m  Wt Readings from Last 3 Encounters:  07/01/16 208 lb (94.3 kg)  03/29/16 206 lb 12.8 oz (93.8 kg)  12/29/15 207 lb 9.6 oz (94.2 kg)    General Appearance: Well nourished well developed, in no apparent distress. Eyes: PERRLA, EOMs, conjunctiva no swelling or erythema ENT/Mouth: Ear canals normal without obstruction, swelling, erythma, discharge.  TMs normal bilaterally.  Oropharynx moist, clear, without exudate, or postoropharyngeal swelling. Neck: Supple, thyroid normal,no cervical adenopathy  Respiratory: Respiratory effort normal, Breath sounds clear A&P without rhonchi, wheeze, or rale.  No retractions, no accessory usage. Cardio: RRR with no MRGs. Brisk peripheral pulses without edema.  Abdomen: Soft, + BS,  Non tender, no guarding, rebound, hernias, masses. Musculoskeletal: Full ROM, 5/5 strength, Normal gait Skin: Warm, dry without rashes, lesions, ecchymosis.  Neuro: Awake and oriented X 3, Cranial nerves intact. Normal muscle tone, no cerebellar symptoms. Psych: Normal affect, Insight and Judgment appropriate.    Vicie Mutters, PA-C 9:02 AM [facility]

## 2016-07-01 ENCOUNTER — Encounter: Payer: Self-pay | Admitting: Physician Assistant

## 2016-07-01 ENCOUNTER — Ambulatory Visit (INDEPENDENT_AMBULATORY_CARE_PROVIDER_SITE_OTHER): Payer: 59 | Admitting: Physician Assistant

## 2016-07-01 VITALS — BP 122/80 | HR 87 | Temp 97.4°F | Resp 16 | Ht 71.0 in | Wt 208.0 lb

## 2016-07-01 DIAGNOSIS — R7303 Prediabetes: Secondary | ICD-10-CM

## 2016-07-01 DIAGNOSIS — E039 Hypothyroidism, unspecified: Secondary | ICD-10-CM | POA: Diagnosis not present

## 2016-07-01 DIAGNOSIS — R972 Elevated prostate specific antigen [PSA]: Secondary | ICD-10-CM

## 2016-07-01 DIAGNOSIS — E559 Vitamin D deficiency, unspecified: Secondary | ICD-10-CM | POA: Diagnosis not present

## 2016-07-01 DIAGNOSIS — I10 Essential (primary) hypertension: Secondary | ICD-10-CM | POA: Diagnosis not present

## 2016-07-01 DIAGNOSIS — E782 Mixed hyperlipidemia: Secondary | ICD-10-CM | POA: Diagnosis not present

## 2016-07-01 DIAGNOSIS — Z79899 Other long term (current) drug therapy: Secondary | ICD-10-CM

## 2016-07-01 LAB — CBC WITH DIFFERENTIAL/PLATELET
BASOS PCT: 0 %
Basophils Absolute: 0 cells/uL (ref 0–200)
EOS ABS: 76 {cells}/uL (ref 15–500)
Eosinophils Relative: 1 %
HEMATOCRIT: 45.8 % (ref 38.5–50.0)
Hemoglobin: 15.2 g/dL (ref 13.2–17.1)
Lymphocytes Relative: 18 %
Lymphs Abs: 1368 cells/uL (ref 850–3900)
MCH: 30.5 pg (ref 27.0–33.0)
MCHC: 33.2 g/dL (ref 32.0–36.0)
MCV: 91.8 fL (ref 80.0–100.0)
MONO ABS: 836 {cells}/uL (ref 200–950)
MONOS PCT: 11 %
MPV: 11.2 fL (ref 7.5–12.5)
NEUTROS ABS: 5320 {cells}/uL (ref 1500–7800)
Neutrophils Relative %: 70 %
PLATELETS: 317 10*3/uL (ref 140–400)
RBC: 4.99 MIL/uL (ref 4.20–5.80)
RDW: 14.1 % (ref 11.0–15.0)
WBC: 7.6 10*3/uL (ref 3.8–10.8)

## 2016-07-01 LAB — TSH: TSH: 1.52 mIU/L (ref 0.40–4.50)

## 2016-07-01 NOTE — Patient Instructions (Signed)
    Bad carbs also include fruit juice, alcohol, and sweet tea. These are empty calories that do not signal to your brain that you are full.   Please remember the good carbs are still carbs which convert into sugar. So please measure them out no more than 1/2-1 cup of rice, oatmeal, pasta, and beans  Veggies are however free foods! Pile them on.   Not all fruit is created equal. Please see the list below, the fruit at the bottom is higher in sugars than the fruit at the top. Please avoid all dried fruits.      Simple math prevails.    1st - exercise does not produce significant weight loss - at best one converts fat into muscle , "bulks up", loses inches, but usually stays "weight neutral"     2nd - think of your body weightas a check book: If you eat more calories than you burn up - you save money or gain weight .... Or if you spend more money than you put in the check book, ie burn up more calories than you eat, then you lose weight     3rd - if you walk or run 1 mile, you burn up 100 calories - you have to burn up 3,500 calories to lose 1 pound, ie you have to walk/run 35 miles to lose 1 measly pound. So if you want to lose 10 #, then you have to walk/run 350 miles, so.... clearly exercise is not the solution.     4. So if you consume 1,500 calories, then you have to burn up the equivalent of 15 miles to stay weight neutral - It also stands to reason that if you consume 1,500 cal/day and don't lose weight, then you must be burning up about 1,500 cals/day to stay weight neutral.     5. If you really want to lose weight, you must cut your calorie intake 300 calories /day and at that rate you should lose about 1 # every 3 days.   6. Please purchase Dr Joel Fuhrman's book(s) "The End of Dieting" & "Eat to Live" . It has some great concepts and recipes.      

## 2016-07-02 LAB — HEPATIC FUNCTION PANEL
ALBUMIN: 4.1 g/dL (ref 3.6–5.1)
ALK PHOS: 54 U/L (ref 40–115)
ALT: 13 U/L (ref 9–46)
AST: 12 U/L (ref 10–35)
BILIRUBIN INDIRECT: 0.5 mg/dL (ref 0.2–1.2)
BILIRUBIN TOTAL: 0.6 mg/dL (ref 0.2–1.2)
Bilirubin, Direct: 0.1 mg/dL (ref <=0.2)
Total Protein: 6.5 g/dL (ref 6.1–8.1)

## 2016-07-02 LAB — LIPID PANEL
CHOL/HDL RATIO: 4 ratio (ref <5.0)
Cholesterol: 175 mg/dL (ref <200)
HDL: 44 mg/dL (ref >40)
LDL CALC: 103 mg/dL — AB (ref <100)
TRIGLYCERIDES: 139 mg/dL (ref <150)
VLDL: 28 mg/dL (ref <30)

## 2016-07-02 LAB — HEMOGLOBIN A1C
HEMOGLOBIN A1C: 5.8 % — AB (ref <5.7)
Mean Plasma Glucose: 120 mg/dL

## 2016-07-02 LAB — BASIC METABOLIC PANEL WITH GFR
BUN: 20 mg/dL (ref 7–25)
CHLORIDE: 106 mmol/L (ref 98–110)
CO2: 21 mmol/L (ref 20–31)
Calcium: 9.4 mg/dL (ref 8.6–10.3)
Creat: 1.39 mg/dL — ABNORMAL HIGH (ref 0.70–1.25)
GFR, EST NON AFRICAN AMERICAN: 54 mL/min — AB (ref >=60)
GFR, Est African American: 62 mL/min (ref >=60)
Glucose, Bld: 113 mg/dL — ABNORMAL HIGH (ref 65–99)
POTASSIUM: 4.3 mmol/L (ref 3.5–5.3)
Sodium: 139 mmol/L (ref 135–146)

## 2016-07-02 LAB — VITAMIN D 25 HYDROXY (VIT D DEFICIENCY, FRACTURES): VIT D 25 HYDROXY: 71 ng/mL (ref 30–100)

## 2016-07-02 LAB — MAGNESIUM: Magnesium: 1.9 mg/dL (ref 1.5–2.5)

## 2016-08-11 ENCOUNTER — Other Ambulatory Visit: Payer: Self-pay | Admitting: Physician Assistant

## 2016-09-15 ENCOUNTER — Encounter: Payer: Self-pay | Admitting: Internal Medicine

## 2016-09-16 ENCOUNTER — Ambulatory Visit (INDEPENDENT_AMBULATORY_CARE_PROVIDER_SITE_OTHER): Payer: 59 | Admitting: Internal Medicine

## 2016-09-16 ENCOUNTER — Encounter: Payer: Self-pay | Admitting: Internal Medicine

## 2016-09-16 VITALS — BP 130/80 | HR 72 | Temp 97.5°F | Resp 18 | Ht 70.75 in | Wt 204.0 lb

## 2016-09-16 DIAGNOSIS — R5383 Other fatigue: Secondary | ICD-10-CM

## 2016-09-16 DIAGNOSIS — E782 Mixed hyperlipidemia: Secondary | ICD-10-CM

## 2016-09-16 DIAGNOSIS — Z1212 Encounter for screening for malignant neoplasm of rectum: Secondary | ICD-10-CM

## 2016-09-16 DIAGNOSIS — Z79899 Other long term (current) drug therapy: Secondary | ICD-10-CM

## 2016-09-16 DIAGNOSIS — E559 Vitamin D deficiency, unspecified: Secondary | ICD-10-CM | POA: Diagnosis not present

## 2016-09-16 DIAGNOSIS — I2510 Atherosclerotic heart disease of native coronary artery without angina pectoris: Secondary | ICD-10-CM

## 2016-09-16 DIAGNOSIS — Z136 Encounter for screening for cardiovascular disorders: Secondary | ICD-10-CM

## 2016-09-16 DIAGNOSIS — I10 Essential (primary) hypertension: Secondary | ICD-10-CM | POA: Diagnosis not present

## 2016-09-16 DIAGNOSIS — Z111 Encounter for screening for respiratory tuberculosis: Secondary | ICD-10-CM | POA: Diagnosis not present

## 2016-09-16 DIAGNOSIS — R7303 Prediabetes: Secondary | ICD-10-CM

## 2016-09-16 DIAGNOSIS — Z0000 Encounter for general adult medical examination without abnormal findings: Secondary | ICD-10-CM | POA: Diagnosis not present

## 2016-09-16 DIAGNOSIS — Z0001 Encounter for general adult medical examination with abnormal findings: Secondary | ICD-10-CM

## 2016-09-16 DIAGNOSIS — K219 Gastro-esophageal reflux disease without esophagitis: Secondary | ICD-10-CM

## 2016-09-16 DIAGNOSIS — Z125 Encounter for screening for malignant neoplasm of prostate: Secondary | ICD-10-CM

## 2016-09-16 DIAGNOSIS — R972 Elevated prostate specific antigen [PSA]: Secondary | ICD-10-CM

## 2016-09-16 LAB — HEPATIC FUNCTION PANEL
ALT: 17 U/L (ref 9–46)
AST: 14 U/L (ref 10–35)
Albumin: 4.4 g/dL (ref 3.6–5.1)
Alkaline Phosphatase: 61 U/L (ref 40–115)
BILIRUBIN DIRECT: 0.2 mg/dL (ref <=0.2)
Indirect Bilirubin: 0.6 mg/dL (ref 0.2–1.2)
Total Bilirubin: 0.8 mg/dL (ref 0.2–1.2)
Total Protein: 6.5 g/dL (ref 6.1–8.1)

## 2016-09-16 LAB — CBC WITH DIFFERENTIAL/PLATELET
BASOS ABS: 67 {cells}/uL (ref 0–200)
Basophils Relative: 1 %
EOS ABS: 201 {cells}/uL (ref 15–500)
Eosinophils Relative: 3 %
HCT: 45.8 % (ref 38.5–50.0)
HEMOGLOBIN: 15.5 g/dL (ref 13.2–17.1)
LYMPHS ABS: 1407 {cells}/uL (ref 850–3900)
Lymphocytes Relative: 21 %
MCH: 30.9 pg (ref 27.0–33.0)
MCHC: 33.8 g/dL (ref 32.0–36.0)
MCV: 91.4 fL (ref 80.0–100.0)
MPV: 11.8 fL (ref 7.5–12.5)
Monocytes Absolute: 670 cells/uL (ref 200–950)
Monocytes Relative: 10 %
NEUTROS ABS: 4355 {cells}/uL (ref 1500–7800)
Neutrophils Relative %: 65 %
Platelets: 293 10*3/uL (ref 140–400)
RBC: 5.01 MIL/uL (ref 4.20–5.80)
RDW: 14 % (ref 11.0–15.0)
WBC: 6.7 10*3/uL (ref 3.8–10.8)

## 2016-09-16 LAB — LIPID PANEL
CHOL/HDL RATIO: 3.6 ratio (ref <5.0)
Cholesterol: 151 mg/dL (ref <200)
HDL: 42 mg/dL (ref >40)
LDL CALC: 92 mg/dL (ref <100)
Triglycerides: 84 mg/dL (ref <150)
VLDL: 17 mg/dL (ref <30)

## 2016-09-16 LAB — TSH: TSH: 2.3 mIU/L (ref 0.40–4.50)

## 2016-09-16 LAB — BASIC METABOLIC PANEL WITH GFR
BUN: 20 mg/dL (ref 7–25)
CO2: 23 mmol/L (ref 20–32)
CREATININE: 1.17 mg/dL (ref 0.70–1.25)
Calcium: 9.5 mg/dL (ref 8.6–10.3)
Chloride: 105 mmol/L (ref 98–110)
GFR, Est African American: 77 mL/min (ref >=60)
GFR, Est Non African American: 66 mL/min (ref >=60)
Glucose, Bld: 100 mg/dL — ABNORMAL HIGH (ref 65–99)
Potassium: 4.6 mmol/L (ref 3.5–5.3)
SODIUM: 138 mmol/L (ref 135–146)

## 2016-09-16 LAB — IRON AND TIBC
%SAT: 48 % (ref 15–60)
Iron: 183 ug/dL — ABNORMAL HIGH (ref 50–180)
TIBC: 380 ug/dL (ref 250–425)
UIBC: 197 ug/dL

## 2016-09-16 MED ORDER — FINASTERIDE 5 MG PO TABS: 5.0000 mg | ORAL_TABLET | Freq: Every day | ORAL | 3 refills | Status: DC

## 2016-09-16 NOTE — Progress Notes (Signed)
[facility]   [name], M.D.      Francis Bristle. Silverio Lay, P.A.-C [facility]                [address] [place], [place] [address] Telephone [phone] Telefax [phone] Annual  Screening/Preventative Visit  & Comprehensive Evaluation & Examination      This very nice 61 y.o. MWM presents for a Screening/Preventative Visit & comprehensive evaluation and management of multiple medical co-morbidities.  Patient has been followed for HTN, Prediabetes, Hyperlipidemia and Vitamin D Deficiency. Patient also has hx/o GERD controlled with diet & Omeprazole. He also has hx/o LUTS and had elevated PSA normalizing after antibiotic Tx and Bx's were cancelled by Dr Diona Fanti.     HTN predates since 2010. Patient's BP has been controlled at home.  Today's BP is at goal - 130/80.  Patient has ASCAD and underwent PCA/Stenting in 2008 by Dr Dorris Carnes and patient denies any cardiac symptoms as chest pain, palpitations, shortness of breath, dizziness or ankle swelling. He had a Negative Cardiolite in 2015. Patient has GERD controlled with prudent diet and his medications      Patient's hyperlipidemia is controlled with diet and medications. Patient denies myalgias or other medication SE's. Last lipids were near goal: Lab Results  Component Value Date   CHOL 175 07/01/2016   HDL 44 07/01/2016   LDLCALC 103 (H) 07/01/2016   TRIG 139 07/01/2016   CHOLHDL 4.0 07/01/2016      Patient has prediabetes (A1c 6.1% in 2014 and  6.3% in 2015)  and patient denies reactive hypoglycemic symptoms, visual blurring, diabetic polys or paresthesias. Last A1c was near goal: Lab Results  Component Value Date   HGBA1C 5.8 (H) 07/01/2016       Finally, patient has history of Vitamin D Deficiency ("43" on treatment in 2014 and "30" in Feb 2016)   and last vitamin D was  Lab Results  Component Value Date   VD25OH 78 07/01/2016    Current Outpatient Prescriptions on File Prior to Visit  Medication Sig  . ALPRAZolam (XANAX) 0.5 MG tablet TAKE 1 TABLET BY MOUTH 3 TIMES DAILY AS NEEDED  . aspirin EC 81 MG tablet 2 tabs po qd  . atorvastatin (LIPITOR) 80 MG tablet TAKE ONE TABLET BY MOUTH ONE TIME DAILY FOR CHOLESTEROL  . Cholecalciferol (VITAMIN D-3) 5000 UNITS TABS Take 1 tablet by mouth daily.  Marland Kitchen esomeprazole (NEXIUM) 20 MG capsule Take 20 mg by mouth daily at 12 noon. Takes OTC 20 mg  . lisinopril (PRINIVIL,ZESTRIL) 10 MG tablet TAKE 1 TABLET BY MOUTH DAILY. ONLY TAKE WHEN BLOOD PRESSURE IS HIGH.  . sildenafil (REVATIO) 20 MG tablet TAKES 1 TO 5 TABLETS DAILY AS NEEDED.   No current facility-administered medications on file prior to visit.    Allergies  Allergen Reactions  . Penicillins     unspecified   Past Medical History:  Diagnosis Date  . ANXIETY 12/25/2007  . ANXIETY STATE NEC 11/01/2006  . CORONARY ARTERY DISEASE 08/02/2006   off Plavix x 3 years  . DIVERTICULITIS OF COLON 01/06/2010  . DVT, HX OF 08/02/2006  . 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 HYPERLIPIDEMIA 08/02/2006  . HYPERTENSION 08/05/2008  . Memory loss 07/21/2009  . Pilonidal cyst    low back pain   Health Maintenance  Topic Date Due  . INFLUENZA VACCINE  09/07/2016  . COLONOSCOPY  06/15/2017  . TETANUS/TDAP  07/29/2020  . Hepatitis C Screening  Completed  . HIV Screening  Completed   Immunization History  Administered Date(s) Administered  . PPD Test 07/24/2013, 07/30/2014, 09/16/2016  . Tdap 07/30/2010   Past Surgical History:  Procedure Laterality Date  . COLONOSCOPY W/ POLYPECTOMY  06/16/2010   diminutive adenoma, diverticulosis, hemorrhoids  . CORONARY ANGIOPLASTY WITH STENT PLACEMENT    . ESOPHAGOGASTRODUODENOSCOPY  06/16/2010   antral ulcers, esophageal stricture - dilated, duodenitis, hiatal hernia  . PILONIDAL CYST EXCISION     teenager   Family History  Problem Relation Age of Onset  . Colon cancer Father         deceased age 33  . Dementia Mother    Social History   Social History  . Marital status: Married    Spouse name: N/A  . Number of children: 2  . 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 Smoking status: Never Smoker  . Smokeless tobacco: Never Used  . Alcohol use Yes     Comment: approx 2 drinks per week  . Drug use: Yes     Comment: marjuana as per 2011 Echart H&P denies any at this time (06/2010  . Sexual activity: Not on file   Other Topics Concern  . Not on file   Social History Narrative   Daily caffeine    ROS Constitutional: Denies fever, chills, weight loss/gain, headaches, insomnia,  night sweats or change in appetite. Does c/o fatigue. Eyes: Denies redness, blurred vision, diplopia, discharge, itchy or watery eyes.  ENT: Denies discharge, congestion, post nasal drip, epistaxis, sore throat, earache, hearing loss, dental pain, Tinnitus, Vertigo, Sinus pain or snoring.  Cardio: Denies chest pain, palpitations, irregular heartbeat, syncope, dyspnea, diaphoresis, orthopnea, PND, claudication or edema Respiratory: denies cough, dyspnea, DOE, pleurisy, hoarseness, laryngitis or wheezing.  Gastrointestinal: Denies dysphagia, heartburn, reflux, water brash, pain, cramps, nausea, vomiting, bloating, diarrhea, constipation, hematemesis, melena, hematochezia, jaundice or hemorrhoids Genitourinary: Denies dysuria, frequency, urgency, nocturia, hesitancy, discharge, hematuria or flank pain Musculoskeletal: Denies arthralgia, myalgia, stiffness, Jt. Swelling, pain, limp or strain/sprain. Denies Falls. Skin: Denies puritis, rash, hives, warts, acne, eczema or change in skin lesion Neuro: No weakness, tremor, incoordination, spasms, paresthesia or pain Psychiatric: Denies confusion, memory loss or sensory loss. Denies Depression. Endocrine: Denies change in weight, skin, hair change, nocturia, and  paresthesia, diabetic polys, visual blurring or hyper / hypo glycemic episodes.  Heme/Lymph: No excessive bleeding, bruising or enlarged lymph nodes.  Physical Exam  BP 130/80   Pulse 72   Temp (!) 97.5 F (36.4 C)   Resp 18   Ht 5' 10.75" (1.797 m)   Wt 204 lb (92.5 kg)   BMI 28.65 kg/m   General Appearance: Well nourished and well groomed and in no apparent distress.  Eyes: PERRLA, EOMs, conjunctiva no swelling or erythema, normal fundi and vessels. Sinuses: No frontal/maxillary tenderness ENT/Mouth: EACs patent / TMs  nl. Nares clear without erythema, swelling, mucoid exudates. Oral hygiene is good. No erythema, swelling, or exudate. Tongue normal, non-obstructing. Tonsils not swollen or erythematous. Hearing normal.  Neck: Supple, thyroid normal. No bruits, nodes or JVD. Respiratory: Respiratory effort normal.  BS equal and clear bilateral without rales, rhonci, wheezing or stridor. Cardio: Heart sounds are normal with regular rate and rhythm and no murmurs, rubs or gallops. Peripheral pulses are normal and equal bilaterally without  edema. No aortic or femoral bruits. Chest: symmetric with normal excursions and percussion.  Abdomen: Soft, with Nl bowel sounds. Nontender, no guarding, rebound, hernias, masses, or organomegaly.  Lymphatics: Non tender without lymphadenopathy.  Genitourinary: No hernias.Testes nl. DRE - prostate nl for age - smooth & firm w/o nodules. Musculoskeletal: Full ROM all peripheral extremities, joint stability, 5/5 strength, and normal gait. Skin: Warm and dry without rashes, lesions, cyanosis, clubbing or  ecchymosis.  Neuro: Cranial nerves intact, reflexes equal bilaterally. Normal muscle tone, no cerebellar symptoms. Sensation intact.  Pysch: Alert and oriented X 3 with normal affect, insight and judgment appropriate.   Assessment and Plan  1. Annual Preventative/Screening Exam    2. Essential hypertension  - EKG 12-Lead - [place], RETROPERITNL ABD,   LTD - Urinalysis, Routine w reflex microscopic - CBC with Differential/Platelet - BASIC METABOLIC PANEL WITH GFR - Magnesium - TSH - Microalbumin / creatinine urine ratio  3. Hyperlipidemia, mixed  - EKG 12-Lead - [place], RETROPERITNL ABD,  LTD - Hepatic function panel - Lipid panel - TSH  4. Prediabetes  - EKG 12-Lead - [place], RETROPERITNL ABD,  LTD - Hemoglobin A1c - Insulin, random  5. Vitamin D deficiency  - VITAMIN D 25 Hydroxy   6. Coronary artery disease involving native heart without angina pectoris  - EKG 12-Lead - Lipid panel  7. Gastroesophageal reflux disease   8. Screening for ischemic heart disease  - EKG 12-Lead  9. Screening for AAA (aortic abdominal aneurysm)  - [place], RETROPERITNL ABD,  LTD  10. Prostate cancer screening  - PSA  11. Elevated PSA  - PSA  12. Screening for rectal cancer  - POC Hemoccult Bld/Stl   13. Fatigue, unspecified type  - Vitamin B12 - Iron and TIBC - Testosterone - CBC with Differential/Platelet - TSH  14. Medication management  - Urinalysis, Routine w reflex microscopic - CBC with Differential/Platelet - BASIC METABOLIC PANEL WITH GFR - Hepatic function panel - Magnesium - Lipid panel - TSH - Hemoglobin A1c - Insulin, random - VITAMIN D 25 Hydroxy  - Microalbumin / creatinine urine ratio  15. Screening examination for pulmonary tuberculosis  - PPD      Patient was counseled in prudent diet, weight control to achieve/maintain BMI less than 25, BP monitoring, regular exercise and medications as discussed.  Discussed med effects and SE's. Routine screening labs and tests as requested with regular follow-up as recommended. Over 40 minutes of exam, counseling, chart review and high complex critical decision making was performed

## 2016-09-16 NOTE — Patient Instructions (Addendum)

## 2016-09-17 LAB — TESTOSTERONE: Testosterone: 464 ng/dL (ref 250–827)

## 2016-09-17 LAB — PSA: PSA: 2.4 ng/mL (ref <=4.0)

## 2016-09-17 LAB — HEMOGLOBIN A1C
HEMOGLOBIN A1C: 5.8 % — AB (ref <5.7)
Mean Plasma Glucose: 120 mg/dL

## 2016-09-17 LAB — URINALYSIS, MICROSCOPIC ONLY
Bacteria, UA: NONE SEEN [HPF]
Casts: NONE SEEN [LPF]
Crystals: NONE SEEN [HPF]
RBC / HPF: NONE SEEN RBC/HPF (ref <=2)
Squamous Epithelial / LPF: NONE SEEN [HPF] (ref <=5)
WBC UA: NONE SEEN WBC/HPF (ref <=5)
Yeast: NONE SEEN [HPF]

## 2016-09-17 LAB — URINALYSIS, ROUTINE W REFLEX MICROSCOPIC
Bilirubin Urine: NEGATIVE
Glucose, UA: NEGATIVE
HGB URINE DIPSTICK: NEGATIVE
Ketones, ur: NEGATIVE
LEUKOCYTES UA: NEGATIVE
NITRITE: NEGATIVE
PROTEIN: NEGATIVE
Specific Gravity, Urine: 1.027 (ref 1.001–1.035)
pH: 5.5 (ref 5.0–8.0)

## 2016-09-17 LAB — MICROALBUMIN / CREATININE URINE RATIO
Creatinine, Urine: 298 mg/dL (ref 20–370)
Microalb Creat Ratio: 2 mcg/mg creat (ref <30)
Microalb, Ur: 0.5 mg/dL

## 2016-09-17 LAB — MAGNESIUM: MAGNESIUM: 1.9 mg/dL (ref 1.5–2.5)

## 2016-09-17 LAB — VITAMIN D 25 HYDROXY (VIT D DEFICIENCY, FRACTURES): VIT D 25 HYDROXY: 87 ng/mL (ref 30–100)

## 2016-09-17 LAB — VITAMIN B12: Vitamin B-12: 303 pg/mL (ref 200–1100)

## 2016-09-19 LAB — INSULIN, RANDOM: Insulin: 7.4 u[IU]/mL (ref 2.0–19.6)

## 2016-10-14 ENCOUNTER — Other Ambulatory Visit: Payer: Self-pay | Admitting: Internal Medicine

## 2016-12-26 NOTE — Progress Notes (Signed)
FOLLOW UP  Assessment and Plan:   Hypertension Well controlled with current medications  Monitor blood pressure at home; patient to call if consistently greater than 130/80 Continue DASH diet.   Reminder to go to the ER if any CP, SOB, nausea, dizziness, severe HA, changes vision/speech, left arm numbness and tingling and jaw pain.  Cholesterol Continue medication Continue low cholesterol diet and exercise.  Check lipid panel.   Prediabetes Continue diet and exercise.  Perform daily foot/skin check, notify office of any concerning changes.  Check A1C  GERD Symptoms well managed without breakthrough Will try to get off PPI given info for taper and zantac sent in  Obesity with co morbidities Long discussion about weight loss, diet, and exercise Discussed ideal weight for height and initial weight goal (205) Patient will work on starting exercise 3 days a week; will reduce nightly gingerale to 1/2 can, increase vegetable intake Will follow up in 3 months  Vitamin D Def/ osteoporosis prevention Continue supplementation Check Vit D level  Continue diet and meds as discussed. Further disposition pending results of labs. Discussed med's effects and SE's.   Over 30 minutes of exam, counseling, chart review, and critical decision making was performed.   Future Appointments  Date Time Provider [place]  04/04/2017  9:30 AM Unk Pinto, MD [facility] None  10/23/2017 10:00 AM Unk Pinto, MD [facility] None    ----------------------------------------------------------------------------------------------------------------------  HPI 61 y.o. male  presents for 3 month follow up on hypertension, cholesterol, prediabetes, weight and vitamin D deficiency. He takes xanax 0.5 mg approximately 3 times a week for sleep.   BMI is Body mass index is 29.69 kg/m., he has not been working on diet and exercise.  Wt Readings from Last 3 Encounters:  12/27/16 211 lb 6.4 oz  (95.9 kg)  09/16/16 204 lb (92.5 kg)  07/01/16 208 lb (94.3 kg)   he has a diagnosis of GERD which is currently managed by nexium 20 mg daily.  he reports symptoms is currently well controlled, and denies breakthrough reflux, burning in chest, hoarseness or cough.    His blood pressure has been controlled at home, today their BP is BP: 126/82  He does not workout. He denies chest pain, shortness of breath, dizziness.   He is on cholesterol medication and denies myalgias. His cholesterol is at goal. The cholesterol last visit was:   Lab Results  Component Value Date   CHOL 151 09/16/2016   HDL 42 09/16/2016   LDLCALC 92 09/16/2016   LDLDIRECT 190.6 07/30/2010   TRIG 84 09/16/2016   CHOLHDL 3.6 09/16/2016    He has not been working on diet and exercise for prediabetes, and denies increased appetite, nausea, paresthesia of the feet, polydipsia, polyuria and visual disturbances. Last A1C in the office was:  Lab Results  Component Value Date   HGBA1C 5.8 (H) 09/16/2016   Patient is on Vitamin D supplement and at goal:    Lab Results  Component Value Date   VD25OH 87 09/16/2016       Current Medications:  Current Outpatient Medications on File Prior to Visit  Medication Sig  . ALPRAZolam (XANAX) 0.5 MG tablet TAKE 1 TABLET BY MOUTH 3 TIMES DAILY AS NEEDED  . aspirin EC 81 MG tablet 2 tabs po qd  . atorvastatin (LIPITOR) 80 MG tablet TAKE ONE TABLET BY MOUTH ONE TIME DAILY FOR CHOLESTEROL  . Cholecalciferol (VITAMIN D-3) 5000 UNITS TABS Take 1 tablet by mouth daily.  Marland Kitchen esomeprazole ([place]) 20  MG capsule Take 20 mg by mouth daily at 12 noon. Takes OTC 20 mg  . finasteride (PROSCAR) 5 MG tablet Take 1 tablet (5 mg total) by mouth daily.  Marland Kitchen lisinopril (PRINIVIL,ZESTRIL) 10 MG tablet TAKE 1 TABLET BY MOUTH DAILY. ONLY TAKE WHEN BLOOD PRESSURE IS HIGH.  . sildenafil (REVATIO) 20 MG tablet TAKE 1 TO 5 TABS BY MOUTH DAILY AS NEEDED.   No current facility-administered medications on  file prior to visit.      Allergies:  Allergies  Allergen Reactions  . Penicillins     unspecified     Medical History:  Past Medical History:  Diagnosis Date  . ANXIETY 12/25/2007  . ANXIETY STATE NEC 11/01/2006  . CORONARY ARTERY DISEASE 08/02/2006   off Plavix x 3 years  . DIVERTICULITIS OF COLON 01/06/2010  . DVT, HX OF 08/02/2006  . 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 HYPERLIPIDEMIA 08/02/2006  . HYPERTENSION 08/05/2008  . Hypothyroidism 03/19/2014  . Memory loss 07/21/2009  . Pilonidal cyst    low back pain   Family history- Reviewed and unchanged Social history- Reviewed and unchanged   Review of Systems:  Review of Systems  Constitutional: Negative for malaise/fatigue and weight loss.  HENT: Negative for hearing loss and tinnitus.   Eyes: Negative for blurred vision and double vision.  Respiratory: Negative for cough, shortness of breath and wheezing.   Cardiovascular: Negative for chest pain, palpitations, orthopnea, claudication and leg swelling.  Gastrointestinal: Negative for abdominal pain, blood in stool, constipation, diarrhea, heartburn, melena, nausea and vomiting.  Genitourinary: Negative.   Musculoskeletal: Negative for joint pain and myalgias.  Skin: Negative for rash.  Neurological: Negative for dizziness, tingling, sensory change, weakness and headaches.  Endo/Heme/Allergies: Negative for polydipsia.  Psychiatric/Behavioral: Negative.  Negative for depression. The patient is not nervous/anxious and does not have insomnia (Well managed with current medications).   All other systems reviewed and are negative.     Physical Exam: BP 126/82   Pulse 69   Temp 98.2 F (36.8 C)   Ht 5' 10.75" (1.797 m)   Wt 211 lb 6.4 oz (95.9 kg)   SpO2 96%   BMI 29.69 kg/m  Wt Readings from Last 3 Encounters:  12/27/16 211 lb 6.4 oz (95.9 kg)  09/16/16 204 lb (92.5 kg)  07/01/16 208 lb (94.3 kg)   General Appearance: Well nourished, in no apparent  distress. Eyes: PERRLA, EOMs, conjunctiva no swelling or erythema Sinuses: No Frontal/maxillary tenderness ENT/Mouth: Ext aud canals clear, TMs without erythema, bulging. No erythema, swelling, or exudate on post pharynx.  Tonsils not swollen or erythematous. Hearing normal.  Neck: Supple, thyroid normal.  Respiratory: Respiratory effort normal, BS equal bilaterally without rales, rhonchi, wheezing or stridor.  Cardio: RRR with no MRGs. Brisk peripheral pulses without edema.  Abdomen: Soft, + BS.  Non tender, no guarding, rebound, hernias, masses. Lymphatics: Non tender without lymphadenopathy.  Musculoskeletal: Full ROM, 5/5 strength, Normal gait Skin: Warm, dry without rashes, lesions, ecchymosis.  Neuro: Cranial nerves intact. No cerebellar symptoms.  Psych: Awake and oriented X 3, normal affect, Insight and Judgment appropriate.    Francis Ribas, [name] 9:09 AM [facility]

## 2016-12-27 ENCOUNTER — Encounter: Payer: Self-pay | Admitting: Adult Health

## 2016-12-27 ENCOUNTER — Ambulatory Visit: Payer: 59 | Admitting: Adult Health

## 2016-12-27 VITALS — BP 126/82 | HR 69 | Temp 98.2°F | Ht 70.75 in | Wt 211.4 lb

## 2016-12-27 DIAGNOSIS — R7303 Prediabetes: Secondary | ICD-10-CM

## 2016-12-27 DIAGNOSIS — E782 Mixed hyperlipidemia: Secondary | ICD-10-CM | POA: Diagnosis not present

## 2016-12-27 DIAGNOSIS — I10 Essential (primary) hypertension: Secondary | ICD-10-CM

## 2016-12-27 DIAGNOSIS — E559 Vitamin D deficiency, unspecified: Secondary | ICD-10-CM

## 2016-12-27 DIAGNOSIS — F419 Anxiety disorder, unspecified: Secondary | ICD-10-CM

## 2016-12-27 DIAGNOSIS — Z79899 Other long term (current) drug therapy: Secondary | ICD-10-CM

## 2016-12-27 DIAGNOSIS — K222 Esophageal obstruction: Secondary | ICD-10-CM | POA: Diagnosis not present

## 2016-12-27 DIAGNOSIS — K219 Gastro-esophageal reflux disease without esophagitis: Secondary | ICD-10-CM | POA: Diagnosis not present

## 2016-12-27 MED ORDER — ALPRAZOLAM 0.5 MG PO TABS
0.5000 mg | ORAL_TABLET | Freq: Three times a day (TID) | ORAL | 1 refills | Status: DC | PRN
Start: 2016-12-27 — End: 2017-01-06

## 2016-12-27 MED ORDER — RANITIDINE HCL 150 MG PO TABS: 150.0000 mg | ORAL_TABLET | Freq: Two times a day (BID) | ORAL | 1 refills | Status: DC

## 2016-12-27 NOTE — Patient Instructions (Signed)
GETTING OFF OF PPI's    Nexium/protonix/prilosec/Omeprazole/Dexilant/Aciphex are called PPI's, they are great at healing your stomach but should only be taken for a short period of time.     Recent studies have shown that taken for a long time they  can increase the risk of osteoporosis (weakening of your bones), pneumonia, low magnesium, restless legs, Cdiff (infection that causes diarrhea), DEMENTIA and most recently kidney damage / disease / insufficiency.     Due to this information we want to try to stop the PPI but if you try to stop it abruptly this can cause rebound acid and worsening symptoms.   So this is how we want you to get off the PPI: Generic is always fine!!  - Start taking the nexium/protonix/prilosec/PPI  every other day with  zantac (ranitidine) OR pepcid famotadine 2 x a day for 2-4 weeks - some people stay on this dosage and can not taper off further. Our main goal is to limit the dosage and amount you are taking so if you need to stay on this dose.   - then decrease the PPI to every 3 days while taking the zantac or pepcid 300mg  twice a day the other  days for 2-4  Weeks  - then you can try the zantac or pepcid 300mg  once at night or up to 2 x day as needed.  - you can continue on this once at night or stop all together  - Avoid alcohol, spicy foods, NSAIDS (aleve, ibuprofen) at this time. See foods below.   +++++++++++++++++++++++++++++++++++++++++++  Food Choices for Gastroesophageal Reflux Disease  When you have gastroesophageal reflux disease (GERD), the foods you eat and your eating habits are very important. Choosing the right foods can help ease the discomfort of GERD. WHAT GENERAL GUIDELINES DO I NEED TO FOLLOW?  Choose fruits, vegetables, whole grains, low-fat dairy products, and low-fat meat, fish, and poultry.  Limit fats such as oils, salad dressings, butter, nuts, and avocado.  Keep a food diary to identify foods that cause symptoms.  Avoid foods  that cause reflux. These may be different for different people.  Eat frequent small meals instead of three large meals each day.  Eat your meals slowly, in a relaxed setting.  Limit fried foods.  Cook foods using methods other than frying.  Avoid drinking alcohol.  Avoid drinking large amounts of liquids with your meals.  Avoid bending over or lying down until 2-3 hours after eating.   WHAT FOODS ARE NOT RECOMMENDED? The following are some foods and drinks that may worsen your symptoms:  Vegetables Tomatoes. Tomato juice. Tomato and spaghetti sauce. Chili peppers. Onion and garlic. Horseradish. Fruits Oranges, grapefruit, and lemon (fruit and juice). Meats High-fat meats, fish, and poultry. This includes hot dogs, ribs, ham, sausage, salami, and bacon. Dairy Whole milk and chocolate milk. Sour cream. Cream. Butter. Ice cream. Cream cheese.  Beverages Coffee and tea, with or without caffeine. Carbonated beverages or energy drinks. Condiments Hot sauce. Barbecue sauce.  Sweets/Desserts Chocolate and cocoa. Donuts. Peppermint and spearmint. Fats and Oils High-fat foods, including [place] fries and potato chips. Other Vinegar. Strong spices, such as black pepper, white pepper, red pepper, cayenne, curry powder, cloves, ginger, and chili powder.

## 2016-12-28 LAB — HEMOGLOBIN A1C
Hgb A1c MFr Bld: 5.8 % of total Hgb — ABNORMAL HIGH (ref <5.7)
MEAN PLASMA GLUCOSE: 120 (calc)
eAG (mmol/L): 6.6 (calc)

## 2016-12-28 LAB — CBC WITH DIFFERENTIAL/PLATELET
BASOS PCT: 0.8 %
Basophils Absolute: 49 cells/uL (ref 0–200)
EOS ABS: 183 {cells}/uL (ref 15–500)
Eosinophils Relative: 3 %
HCT: 41.6 % (ref 38.5–50.0)
Hemoglobin: 14.1 g/dL (ref 13.2–17.1)
Lymphs Abs: 1177 cells/uL (ref 850–3900)
MCH: 30.9 pg (ref 27.0–33.0)
MCHC: 33.9 g/dL (ref 32.0–36.0)
MCV: 91.2 fL (ref 80.0–100.0)
MONOS PCT: 10.6 %
MPV: 11.8 fL (ref 7.5–12.5)
Neutro Abs: 4044 cells/uL (ref 1500–7800)
Neutrophils Relative %: 66.3 %
PLATELETS: 278 10*3/uL (ref 140–400)
RBC: 4.56 10*6/uL (ref 4.20–5.80)
RDW: 13 % (ref 11.0–15.0)
TOTAL LYMPHOCYTE: 19.3 %
WBC mixed population: 647 cells/uL (ref 200–950)
WBC: 6.1 10*3/uL (ref 3.8–10.8)

## 2016-12-28 LAB — HEPATIC FUNCTION PANEL
AG Ratio: 1.9 (calc) (ref 1.0–2.5)
ALKALINE PHOSPHATASE (APISO): 60 U/L (ref 40–115)
ALT: 17 U/L (ref 9–46)
AST: 13 U/L (ref 10–35)
Albumin: 4.2 g/dL (ref 3.6–5.1)
BILIRUBIN INDIRECT: 0.2 mg/dL (ref 0.2–1.2)
BILIRUBIN TOTAL: 0.3 mg/dL (ref 0.2–1.2)
Bilirubin, Direct: 0.1 mg/dL (ref 0.0–0.2)
GLOBULIN: 2.2 g/dL (ref 1.9–3.7)
Total Protein: 6.4 g/dL (ref 6.1–8.1)

## 2016-12-28 LAB — MAGNESIUM: MAGNESIUM: 1.9 mg/dL (ref 1.5–2.5)

## 2016-12-28 LAB — BASIC METABOLIC PANEL WITH GFR
BUN / CREAT RATIO: 14 (calc) (ref 6–22)
BUN: 18 mg/dL (ref 7–25)
CO2: 28 mmol/L (ref 20–32)
CREATININE: 1.3 mg/dL — AB (ref 0.70–1.25)
Calcium: 9.8 mg/dL (ref 8.6–10.3)
Chloride: 105 mmol/L (ref 98–110)
GFR, EST AFRICAN AMERICAN: 67 mL/min/{1.73_m2} (ref >=60)
GFR, EST NON AFRICAN AMERICAN: 58 mL/min/{1.73_m2} — AB (ref >=60)
GLUCOSE: 108 mg/dL — AB (ref 65–99)
Potassium: 5.4 mmol/L — ABNORMAL HIGH (ref 3.5–5.3)
SODIUM: 140 mmol/L (ref 135–146)

## 2016-12-28 LAB — VITAMIN D 25 HYDROXY (VIT D DEFICIENCY, FRACTURES): Vit D, 25-Hydroxy: 40 ng/mL (ref 30–100)

## 2016-12-28 LAB — LIPID PANEL
CHOL/HDL RATIO: 3.9 (calc) (ref <5.0)
Cholesterol: 191 mg/dL (ref <200)
HDL: 49 mg/dL (ref >40)
LDL Cholesterol (Calc): 110 mg/dL (calc) — ABNORMAL HIGH
NON-HDL CHOLESTEROL (CALC): 142 mg/dL — AB (ref <130)
TRIGLYCERIDES: 203 mg/dL — AB (ref <150)

## 2016-12-28 LAB — TSH: TSH: 3.47 mIU/L (ref 0.40–4.50)

## 2017-01-06 ENCOUNTER — Other Ambulatory Visit: Payer: Self-pay | Admitting: Internal Medicine

## 2017-01-06 DIAGNOSIS — F419 Anxiety disorder, unspecified: Secondary | ICD-10-CM

## 2017-03-04 ENCOUNTER — Other Ambulatory Visit: Payer: Self-pay | Admitting: Physician Assistant

## 2017-03-29 DIAGNOSIS — E663 Overweight: Secondary | ICD-10-CM | POA: Insufficient documentation

## 2017-03-29 NOTE — Progress Notes (Signed)
FOLLOW UP  Assessment and Plan:   Hypertension Well controlled with current medications  Monitor blood pressure at home; patient to call if consistently greater than 130/80 Continue DASH diet.   Reminder to go to the ER if any CP, SOB, nausea, dizziness, severe HA, changes vision/speech, left arm numbness and tingling and jaw pain.  Cholesterol Currently at LDL goal; diet discussed for trigs Continue low cholesterol diet and exercise.  Check lipid panel.   Prediabetes Continue diet and exercise.  Perform daily foot/skin check, notify office of any concerning changes.  Check A1C  Overweight with co morbidities Long discussion about weight loss, diet, and exercise Recommended diet heavy in fruits and veggies and low in animal meats, cheeses, and dairy products, appropriate calorie intake Discussed ideal weight for height and initial weight goal  Will follow up in 3 months  Vitamin D Def Well below goal at last visit; dose was not changed; continue supplementation to maintain goal of 70-100 Defer Vit D level  Anxiety Well managed by current regimen; continue medications Stress management techniques discussed, increase water, good sleep hygiene discussed, increase exercise, and increase veggies.   Lower back pain- negative straight leg Prednisone was prescribed,NSAIDs, RICE, and exercise given Consider Physical Therapy and XRay studies if not improving. Call or return to clinic prn if these symptoms worsen or fail to improve as anticipated.  Continue diet and meds as discussed. Further disposition pending results of labs. Discussed med's effects and SE's.   Over 30 minutes of exam, counseling, chart review, and critical decision making was performed.   Future Appointments  Date Time Provider [place]  10/23/2017 10:00 AM Unk Pinto, MD [facility] None     ----------------------------------------------------------------------------------------------------------------------  HPI 62 y.o. male with hx of  ASCAD s/p PTCA/Stent (2005) presents for 3 month follow up on hypertension, cholesterol, diabetes, weight and vitamin D deficiency as well as acute c/o back pain.   Complains of low back pain 1 week ago without any notable injury (woke up in morning with pain). Describes pain as dull ache, "like a tooth ache," 5/10. The pain is positional with bending or lifting, without radiation down the legs. Symptoms have been constant since that time. Prior history of back problems: recurrent self limited episodes of low back pain in the past. There is no numbness in the legs. Patient denies fever, hematuria, incontinence, numbness, tingling, weakness and saddle anesthesia. He has taken some advil for the pain which has helped. He reports pain is trending to improve.   he has a diagnosis of anxiety and is currently on xanax 0.25-0.5 mg PRN BID or TID, reports symptoms are well controlled on current regimen. he currently reports taking 3 or so weekly.  BMI is Body mass index is 29.5 kg/m., he has been working on diet, has cut out ginger ale, reduced sugar in tea, and exercise. Wt Readings from Last 3 Encounters:  03/30/17 210 lb (95.3 kg)  12/27/16 211 lb 6.4 oz (95.9 kg)  09/16/16 204 lb (92.5 kg)   His blood pressure has been controlled at home, today their BP is BP: 122/80  He does not workout. He denies chest pain, shortness of breath, dizziness.   He is on cholesterol medication and denies myalgias. His LDLcholesterol is at goal, trigs remain elevated. The cholesterol last visit was:   Lab Results  Component Value Date   CHOL 191 12/27/2016   HDL 49 12/27/2016   LDLCALC 92 09/16/2016   LDLDIRECT 190.6 07/30/2010   TRIG  203 (H) 12/27/2016   CHOLHDL 3.9 12/27/2016    He has been working on diet and exercise for prediabetes, and denies increased  appetite, nausea, paresthesia of the feet, polydipsia, polyuria, visual disturbances, vomiting and weight loss. Last A1C in the office was:  Lab Results  Component Value Date   HGBA1C 5.8 (H) 12/27/2016   Patient is on Vitamin D supplement but remains below goal of 70:    Lab Results  Component Value Date   VD25OH 40 12/27/2016        Current Medications:  Current Outpatient Medications on File Prior to Visit  Medication Sig  . ALPRAZolam (XANAX) 0.5 MG tablet Take 1/2 to 1 tablet 2 to 3 x / day onl if needed for anxiety and please try to limit to 5 days /week to avoid addiction  . aspirin EC 81 MG tablet 2 tabs po qd  . atorvastatin (LIPITOR) 80 MG tablet TAKE ONE TABLET BY MOUTH ONE TIME DAILY FOR CHOLESTEROL  . Cholecalciferol (VITAMIN D-3) 5000 UNITS TABS Take 1 tablet by mouth daily.  Marland Kitchen esomeprazole (NEXIUM) 20 MG capsule Take 20 mg by mouth daily at 12 noon. Takes OTC 20 mg  . finasteride (PROSCAR) 5 MG tablet Take 1 tablet (5 mg total) by mouth daily. (Patient taking differently: Take 5 mg by mouth daily. Take 1/2 tablet daily)  . lisinopril (PRINIVIL,ZESTRIL) 10 MG tablet Take 1 tablet daily for BP  . sildenafil (REVATIO) 20 MG tablet TAKE 1 TO 5 TABS BY MOUTH DAILY AS NEEDED.  . ranitidine (ZANTAC) 150 MG tablet Take 1 tablet (150 mg total) 2 (two) times daily by mouth. (Patient not taking: Reported on 03/30/2017)   No current facility-administered medications on file prior to visit.      Allergies:  Allergies  Allergen Reactions  . Penicillins     unspecified     Medical History:  Past Medical History:  Diagnosis Date  . ANXIETY 12/25/2007  . ANXIETY STATE NEC 11/01/2006  . CORONARY ARTERY DISEASE 08/02/2006   off Plavix x 3 years  . DIVERTICULITIS OF COLON 01/06/2010  . DVT, HX OF 08/02/2006  . 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 HYPERLIPIDEMIA 08/02/2006  . HYPERTENSION 08/05/2008  . Hypothyroidism 03/19/2014  . Memory loss 07/21/2009  . Pilonidal cyst    low back  pain   Family history- Reviewed and unchanged Social history- Reviewed and unchanged   Review of Systems:  Review of Systems  Constitutional: Negative for malaise/fatigue and weight loss.  HENT: Negative for hearing loss and tinnitus.   Eyes: Negative for blurred vision and double vision.  Respiratory: Negative for cough, shortness of breath and wheezing.   Cardiovascular: Negative for chest pain, palpitations, orthopnea, claudication and leg swelling.  Gastrointestinal: Negative for abdominal pain, blood in stool, constipation, diarrhea, heartburn, melena, nausea and vomiting.  Genitourinary: Negative.   Musculoskeletal: Positive for back pain. Negative for joint pain and myalgias.  Skin: Negative for rash.  Neurological: Negative for dizziness, tingling, sensory change, weakness and headaches.  Endo/Heme/Allergies: Negative for polydipsia.  Psychiatric/Behavioral: Negative for depression, memory loss and substance abuse. The patient has insomnia. The patient is not nervous/anxious.   All other systems reviewed and are negative.   Physical Exam: BP 122/80   Pulse 77   Temp 97.9 F (36.6 C)   Ht 5' 10.75" (1.797 m)   Wt 210 lb (95.3 kg)   SpO2 99%   BMI 29.50 kg/m  Wt Readings from Last 3 Encounters:  03/30/17  210 lb (95.3 kg)  12/27/16 211 lb 6.4 oz (95.9 kg)  09/16/16 204 lb (92.5 kg)   General Appearance: Well nourished, in no apparent distress. Eyes: PERRLA, EOMs, conjunctiva no swelling or erythema Sinuses: No Frontal/maxillary tenderness ENT/Mouth: Ext aud canals clear, TMs without erythema, bulging. No erythema, swelling, or exudate on post pharynx.  Tonsils not swollen or erythematous. Hearing normal.  Neck: Supple, thyroid normal.  Respiratory: Respiratory effort normal, BS equal bilaterally without rales, rhonchi, wheezing or stridor.  Cardio: RRR with no MRGs. Brisk peripheral pulses without edema.  Abdomen: Soft, + BS.  Non tender, no guarding, rebound,  hernias, masses. Lymphatics: Non tender without lymphadenopathy.  Musculoskeletal: Full ROM, 5/5 strength, antalgic gait. Some tenderness over bilateral lower back near SI joints bilaterally without crepitus, notable tenderness to palpation, heat.  Skin: Warm, dry without rashes, lesions, ecchymosis.  Neuro: Cranial nerves intact. No cerebellar symptoms.  Psych: Awake and oriented X 3, normal affect, Insight and Judgment appropriate.    Izora Ribas, NP 11:17 AM [facility]

## 2017-03-30 ENCOUNTER — Ambulatory Visit: Payer: 59 | Admitting: Adult Health

## 2017-03-30 ENCOUNTER — Encounter: Payer: Self-pay | Admitting: Adult Health

## 2017-03-30 VITALS — BP 122/80 | HR 77 | Temp 97.9°F | Ht 70.75 in | Wt 210.0 lb

## 2017-03-30 DIAGNOSIS — K222 Esophageal obstruction: Secondary | ICD-10-CM

## 2017-03-30 DIAGNOSIS — R7303 Prediabetes: Secondary | ICD-10-CM

## 2017-03-30 DIAGNOSIS — F419 Anxiety disorder, unspecified: Secondary | ICD-10-CM | POA: Diagnosis not present

## 2017-03-30 DIAGNOSIS — E782 Mixed hyperlipidemia: Secondary | ICD-10-CM

## 2017-03-30 DIAGNOSIS — M545 Low back pain, unspecified: Secondary | ICD-10-CM

## 2017-03-30 DIAGNOSIS — E663 Overweight: Secondary | ICD-10-CM

## 2017-03-30 DIAGNOSIS — Z79899 Other long term (current) drug therapy: Secondary | ICD-10-CM | POA: Diagnosis not present

## 2017-03-30 DIAGNOSIS — K219 Gastro-esophageal reflux disease without esophagitis: Secondary | ICD-10-CM

## 2017-03-30 DIAGNOSIS — I10 Essential (primary) hypertension: Secondary | ICD-10-CM

## 2017-03-30 DIAGNOSIS — E559 Vitamin D deficiency, unspecified: Secondary | ICD-10-CM

## 2017-03-30 DIAGNOSIS — F411 Generalized anxiety disorder: Secondary | ICD-10-CM

## 2017-03-30 MED ORDER — ALPRAZOLAM 0.5 MG PO TABS: ORAL_TABLET | ORAL | 0 refills | Status: DC

## 2017-03-30 MED ORDER — PREDNISONE 20 MG PO TABS: ORAL_TABLET | ORAL | 0 refills | Status: DC

## 2017-03-30 NOTE — Patient Instructions (Signed)
Try the exercises and other information in the back care manual, meloxicam/mobic once during the day as needed (avoid taking other NSAIDS like Alleve or Ibuprofen while taking this) and prednisone if needed for the pain.    Go to the ER if you have any new weakness in your legs, have trouble controlling your urine or bowels, or have worsening pain.   If you are not better in 1-3 month we will get an xray and refer you to ortho   Back pain Rehab Ask your health care provider which exercises are safe for you. Do exercises exactly as told by your health care provider and adjust them as directed. It is normal to feel mild stretching, pulling, tightness, or discomfort as you do these exercises, but you should stop right away if you feel sudden pain or your pain gets worse.Do not begin these exercises until told by your health care provider. Stretching and range of motion exercises These exercises warm up your muscles and joints and improve the movement and flexibility of your hips and your back. These exercises also help to relieve pain, numbness, and tingling. Exercise A: Sciatic nerve glide 1. Sit in a chair with your head facing down toward your chest. Place your hands behind your back. Let your shoulders slump forward. 2. Slowly straighten one of your knees while you tilt your head back as if you are looking toward the ceiling. Only straighten your leg as far as you can without making your symptoms worse. 3. Hold for __________ seconds. 4. Slowly return to the starting position. 5. Repeat with your other leg. Repeat __________ times. Complete this exercise __________ times a day. Exercise B: Knee to chest with hip adduction and internal rotation  1. Lie on your back on a firm surface with both legs straight. 2. Bend one of your knees and move it up toward your chest until you feel a gentle stretch in your lower back and buttock. Then, move your knee toward the shoulder that is on the opposite  side from your leg. ? Hold your leg in this position by holding onto the front of your knee. 3. Hold for __________ seconds. 4. Slowly return to the starting position. 5. Repeat with your other leg. Repeat __________ times. Complete this exercise __________ times a day. Exercise C: Prone extension on elbows  1. Lie on your abdomen on a firm surface. A bed may be too soft for this exercise. 2. Prop yourself up on your elbows. 3. Use your arms to help lift your chest up until you feel a gentle stretch in your abdomen and your lower back. ? This will place some of your body weight on your elbows. If this is uncomfortable, try stacking pillows under your chest. ? Your hips should stay down, against the surface that you are lying on. Keep your hip and back muscles relaxed. 4. Hold for __________ seconds. 5. Slowly relax your upper body and return to the starting position. Repeat __________ times. Complete this exercise __________ times a day. Strengthening exercises These exercises build strength and endurance in your back. Endurance is the ability to use your muscles for a long time, even after they get tired. Exercise D: Pelvic tilt 1. Lie on your back on a firm surface. Bend your knees and keep your feet flat. 2. Tense your abdominal muscles. Tip your pelvis up toward the ceiling and flatten your lower back into the floor. ? To help with this exercise, you may place a small  towel under your lower back and try to push your back into the towel. 3. Hold for __________ seconds. 4. Let your muscles relax completely before you repeat this exercise. Repeat __________ times. Complete this exercise __________ times a day. Exercise E: Alternating arm and leg raises  1. Get on your hands and knees on a firm surface. If you are on a hard floor, you may want to use padding to cushion your knees, such as an exercise mat. 2. Line up your arms and legs. Your hands should be below your shoulders, and your  knees should be below your hips. 3. Lift your left leg behind you. At the same time, raise your right arm and straighten it in front of you. ? Do not lift your leg higher than your hip. ? Do not lift your arm higher than your shoulder. ? Keep your abdominal and back muscles tight. ? Keep your hips facing the ground. ? Do not arch your back. ? Keep your balance carefully, and do not hold your breath. 4. Hold for __________ seconds. 5. Slowly return to the starting position and repeat with your right leg and your left arm. Repeat __________ times. Complete this exercise __________ times a day. Posture and body mechanics  Body mechanics refers to the movements and positions of your body while you do your daily activities. Posture is part of body mechanics. Good posture and healthy body mechanics can help to relieve stress in your body's tissues and joints. Good posture means that your spine is in its natural S-curve position (your spine is neutral), your shoulders are pulled back slightly, and your head is not tipped forward. The following are general guidelines for applying improved posture and body mechanics to your everyday activities. Standing   When standing, keep your spine neutral and your feet about hip-width apart. Keep a slight bend in your knees. Your ears, shoulders, and hips should line up.  When you do a task in which you stand in one place for a long time, place one foot up on a stable object that is 2-4 inches (5-10 cm) high, such as a footstool. This helps keep your spine neutral. Sitting   When sitting, keep your spine neutral and keep your feet flat on the floor. Use a footrest, if necessary, and keep your thighs parallel to the floor. Avoid rounding your shoulders, and avoid tilting your head forward.  When working at a desk or a computer, keep your desk at a height where your hands are slightly lower than your elbows. Slide your chair under your desk so you are close enough  to maintain good posture.  When working at a computer, place your monitor at a height where you are looking straight ahead and you do not have to tilt your head forward or downward to look at the screen. Resting   When lying down and resting, avoid positions that are most painful for you.  If you have pain with activities such as sitting, bending, stooping, or squatting (flexion-based activities), lie in a position in which your body does not bend very much. For example, avoid curling up on your side with your arms and knees near your chest (fetal position).  If you have pain with activities such as standing for a long time or reaching with your arms (extension-based activities), lie with your spine in a neutral position and bend your knees slightly. Try the following positions: ? Lying on your side with a pillow between your knees. ?  Lying on your back with a pillow under your knees. Lifting   When lifting objects, keep your feet at least shoulder-width apart and tighten your abdominal muscles.  Bend your knees and hips and keep your spine neutral. It is important to lift using the strength of your legs, not your back. Do not lock your knees straight out.  Always ask for help to lift heavy or awkward objects. This information is not intended to replace advice given to you by your health care provider. Make sure you discuss any questions you have with your health care provider. Document Released: 01/24/2005 Document Revised: 10/01/2015 Document Reviewed: 10/10/2014 Elsevier Interactive Patient Education  [employer].

## 2017-03-31 LAB — CBC WITH DIFFERENTIAL/PLATELET
BASOS ABS: 54 {cells}/uL (ref 0–200)
Basophils Relative: 0.7 %
EOS ABS: 223 {cells}/uL (ref 15–500)
Eosinophils Relative: 2.9 %
HEMATOCRIT: 42.6 % (ref 38.5–50.0)
HEMOGLOBIN: 14.8 g/dL (ref 13.2–17.1)
Lymphs Abs: 1455 cells/uL (ref 850–3900)
MCH: 30.8 pg (ref 27.0–33.0)
MCHC: 34.7 g/dL (ref 32.0–36.0)
MCV: 88.8 fL (ref 80.0–100.0)
MONOS PCT: 7.7 %
MPV: 11.9 fL (ref 7.5–12.5)
NEUTROS ABS: 5375 {cells}/uL (ref 1500–7800)
Neutrophils Relative %: 69.8 %
Platelets: 307 10*3/uL (ref 140–400)
RBC: 4.8 10*6/uL (ref 4.20–5.80)
RDW: 12.7 % (ref 11.0–15.0)
Total Lymphocyte: 18.9 %
WBC: 7.7 10*3/uL (ref 3.8–10.8)
WBCMIX: 593 {cells}/uL (ref 200–950)

## 2017-03-31 LAB — BASIC METABOLIC PANEL WITH GFR
BUN / CREAT RATIO: 17 (calc) (ref 6–22)
BUN: 21 mg/dL (ref 7–25)
CO2: 27 mmol/L (ref 20–32)
Calcium: 9.7 mg/dL (ref 8.6–10.3)
Chloride: 105 mmol/L (ref 98–110)
Creat: 1.27 mg/dL — ABNORMAL HIGH (ref 0.70–1.25)
GFR, EST AFRICAN AMERICAN: 69 mL/min/{1.73_m2} (ref >=60)
GFR, EST NON AFRICAN AMERICAN: 60 mL/min/{1.73_m2} (ref >=60)
GLUCOSE: 94 mg/dL (ref 65–99)
Potassium: 4.5 mmol/L (ref 3.5–5.3)
SODIUM: 139 mmol/L (ref 135–146)

## 2017-03-31 LAB — HEPATIC FUNCTION PANEL
AG Ratio: 1.8 (calc) (ref 1.0–2.5)
ALT: 22 U/L (ref 9–46)
AST: 12 U/L (ref 10–35)
Albumin: 4.3 g/dL (ref 3.6–5.1)
Alkaline phosphatase (APISO): 58 U/L (ref 40–115)
Bilirubin, Direct: 0.1 mg/dL (ref 0.0–0.2)
Globulin: 2.4 g/dL (ref 1.9–3.7)
Indirect Bilirubin: 0.3 mg/dL (ref 0.2–1.2)
Total Bilirubin: 0.4 mg/dL (ref 0.2–1.2)
Total Protein: 6.7 g/dL (ref 6.1–8.1)

## 2017-03-31 LAB — LIPID PANEL
Cholesterol: 199 mg/dL
HDL: 51 mg/dL
LDL Cholesterol (Calc): 113 mg/dL — ABNORMAL HIGH
Non-HDL Cholesterol (Calc): 148 mg/dL — ABNORMAL HIGH
Total CHOL/HDL Ratio: 3.9 (calc)
Triglycerides: 234 mg/dL — ABNORMAL HIGH

## 2017-03-31 LAB — HEMOGLOBIN A1C
EAG (MMOL/L): 7.1 (calc)
Hgb A1c MFr Bld: 6.1 % of total Hgb — ABNORMAL HIGH (ref <5.7)
Mean Plasma Glucose: 128 (calc)

## 2017-03-31 LAB — TSH: TSH: 3.57 m[IU]/L (ref 0.40–4.50)

## 2017-04-04 ENCOUNTER — Ambulatory Visit: Payer: Self-pay | Admitting: Internal Medicine

## 2017-04-12 ENCOUNTER — Ambulatory Visit: Payer: 59 | Admitting: Internal Medicine

## 2017-04-12 ENCOUNTER — Encounter: Payer: Self-pay | Admitting: Internal Medicine

## 2017-04-12 VITALS — BP 118/74 | HR 80 | Temp 97.3°F | Resp 18 | Ht 70.75 in | Wt 214.0 lb

## 2017-04-12 DIAGNOSIS — K921 Melena: Secondary | ICD-10-CM

## 2017-04-12 DIAGNOSIS — M5442 Lumbago with sciatica, left side: Secondary | ICD-10-CM | POA: Diagnosis not present

## 2017-04-12 MED ORDER — PREDNISONE 20 MG PO TABS: ORAL_TABLET | ORAL | 0 refills | Status: DC

## 2017-04-12 NOTE — Progress Notes (Signed)
[facility]  Unk Pinto, M.D.  Uvaldo Bristle. Silverio Lay, P.A.-C   Liane Comber, [place] [address] [place] [address] Telephone [phone] Telefax [phone]  Subjective:    Patient ID: [name], male    DOB: January 08, 1956, 62 y.o.   MRN: [id number]  HPI  This very nice 62 yo MWM re-presents with c/o LBP and sciatica to the Lt posterior buttock , down the posterior LLE. In Nov 2017 he had Negative X-rays of the sacrum & coccyx. Then in April 2018 , he had Lumbar MRI suspicious for Left L5/S1 abnormality  And question of compromise of the Lt S1 root sleeve.  He states his father died with metastatic colon cancer and is afraid that he may have cancer also . He did have a colonoscopy  06/24/2010 by Dr Carlean Purl and was advised 7 year f/u - due 06/2017. He does report intermittent hematochezia.   Medication Sig  . ALPRAZolam (XANAX) 0.5 MG tablet Take 1/2 to 1 tablet 2 to 3 x / day onl if needed for anxiety and please try to limit to 5 days /week to avoid addiction  . aspirin EC 81 MG tablet 2 tabs po qd  . atorvastatin  80 MG tablet TAKE ONE TABLET BY MOUTH ONE TIME DAILY FOR CHOLESTEROL  . VIT D 5000 UNITS TABS Take 1 tablet by mouth daily.  Marland Kitchen esomeprazole  20 MG capsule Take 20 mg by mouth daily at 12 noon. Takes OTC 20 mg  . finasteride  5 MG tablet Take 1 tablet (5 mg total) by mouth daily. (Patient taking differently: Take 5 mg by mouth daily. Take 1/2 tablet daily)  . lisinopril  10 MG tablet Take 1 tablet daily for BP  . sildenafil  20 MG tablet TAKE 1 TO 5 TABS DAILY AS NEEDED.  . ranitidine  150 MG tablet Patient not taking: Reported on 03/30/2017   Allergies  Allergen Reactions  . Penicillins     unspecified   Past Medical History:  Diagnosis Date  . ANXIETY 12/25/2007  . ANXIETY STATE NEC 11/01/2006  . CORONARY ARTERY DISEASE 08/02/2006   off Plavix x 3 years  . DIVERTICULITIS OF  COLON 01/06/2010  . DVT, HX OF 08/02/2006  . 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 HYPERLIPIDEMIA 08/02/2006  . HYPERTENSION 08/05/2008  . Hypothyroidism 03/19/2014  . Memory loss 07/21/2009  . Pilonidal cyst    low back pain   Past Surgical History:  Procedure Laterality Date  . COLONOSCOPY W/ POLYPECTOMY  06/16/2010   diminutive adenoma, diverticulosis, hemorrhoids  . CORONARY ANGIOPLASTY WITH STENT PLACEMENT    . ESOPHAGOGASTRODUODENOSCOPY  06/16/2010   antral ulcers, esophageal stricture - dilated, duodenitis, hiatal hernia  . PILONIDAL CYST EXCISION     teenager   Review of Systems  10 point systems review negative except as above.    Objective:   Physical Exam  BP 118/74   Pulse 80   Temp (!) 97.3 F (36.3 C)   Resp 18   Ht 5' 10.75" (1.797 m)   Wt 214 lb (97.1 kg)   BMI 30.06 kg/m   HEENT - WNL. Neck - supple.  Chest - Clear equal BS. Cor - Nl HS. RRR w/o sig MGR. PP 1(+). No edema. MS- FROM w/o deformities.  Gait Nl. Neuro -  Nl w/o focal abnormalities. Neg SLR & Hip Fig-4. Cannot localize an area of tenderness about the sacrococcygeal area  Assessment & Plan:   1. Acute left-sided low back pain with left-sided sciatica  - predniSONE (DELTASONE) 20 MG tablet; 1 tab 3 x day for 3 days, then 1 tab 2 x day for 3 days, then 1 tab 1 x day for 5 days  Dispense: 20 tablet; Refill: 0  - DG Lumbar Spine Complete - DG Pelvis 1-2 Views; Future   - Sx Lyrica 75 mg  # 28 - take 1-2 QHS  2. Hematochezia  - referral back to Dr Carlean Purl.

## 2017-04-13 ENCOUNTER — Ambulatory Visit (HOSPITAL_COMMUNITY)
Admission: RE | Admit: 2017-04-13 | Discharge: 2017-04-13 | Disposition: A | Discharge status: Home / Self Care | Payer: 59 | Source: Ambulatory Visit | Attending: Internal Medicine | Admitting: Internal Medicine

## 2017-04-13 ENCOUNTER — Encounter: Payer: Self-pay | Admitting: Internal Medicine

## 2017-04-13 DIAGNOSIS — M5442 Lumbago with sciatica, left side: Secondary | ICD-10-CM

## 2017-04-13 DIAGNOSIS — M47896 Other spondylosis, lumbar region: Secondary | ICD-10-CM | POA: Diagnosis not present

## 2017-04-14 ENCOUNTER — Encounter (INDEPENDENT_AMBULATORY_CARE_PROVIDER_SITE_OTHER): Payer: Self-pay

## 2017-05-04 ENCOUNTER — Encounter: Payer: Self-pay | Admitting: Internal Medicine

## 2017-05-04 ENCOUNTER — Ambulatory Visit: Payer: 59 | Admitting: Internal Medicine

## 2017-05-04 VITALS — BP 116/72 | HR 84 | Temp 97.7°F | Resp 18 | Ht 70.75 in | Wt 209.8 lb

## 2017-05-04 DIAGNOSIS — R1032 Left lower quadrant pain: Secondary | ICD-10-CM

## 2017-05-04 NOTE — Progress Notes (Signed)
  Subjective:    Patient ID: [name], male    DOB: [birth year], 62 y.o.   MRN: [id number]  HPI  Patient is a nice 62 yo MWM who presents with a 2-3 day hx/o vague discomfort in the Left groin. Systems review is otherwise negative. Note patient relates that his father died with Colon cancer and fears getting cancer.  Medication Sig  . ALPRAZolam0.5 MG tablet Take 1/2 to 1 tablet 2 to 3 x / day onl if needed for anxiety and please try to limit to 5 days /week to avoid addiction  . aspirin EC 81 MG  2 tabs po qd  . Atorvastatin 80 MG TAKE ONE TAB ONE TIME DAILY  . VIT D 5000 UNITS  Take 1 tablet  daily.  Marland Kitchen esomeprazole  20 MG  Take 20 mg  daily at 12 noon.  . finasteride  5 MG  Take 1/2 tablet daily  . lisinopril 10 MG  Take 1 tablet daily for BP  . sildenafil  20 MG  TAKE 1 TO 5 TABS  DAILY AS NEEDED.   Allergies  Allergen Reactions  . Penicillins     unspecified   Past Medical History:  Diagnosis Date  . ANXIETY 12/25/2007  . ANXIETY STATE NEC 11/01/2006  . CORONARY ARTERY DISEASE 08/02/2006   off Plavix x 3 years  . DIVERTICULITIS OF COLON 01/06/2010  . DVT, HX OF 08/02/2006  . 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 HYPERLIPIDEMIA 08/02/2006  . HYPERTENSION 08/05/2008  . Hypothyroidism 03/19/2014  . Memory loss 07/21/2009  . Pilonidal cyst    low back pain      Objective:   Physical Exam  BP 116/72   Pulse 84   Temp 97.7 F (36.5 C)   Resp 18   Ht 5' 10.75" (1.797 m)   Wt 209 lb 12.8 oz (95.2 kg)   BMI 29.47 kg/m   HEENT - WNL. Neck - supple.  Chest - Clear equal BS. Cor - Nl HS. RRR w/o sig m.  Abd - soft , benign.  GU - No inguinal hernias. Bilat testes & cords are normal. No abnormal masses are palpated in either groin. MS- FROM w/o deformities.  Gait Nl. Neuro -  Nl w/o focal abnormalities.    Assessment & Plan:   1. Groin discomfort, left  - patient reassured

## 2017-05-08 ENCOUNTER — Other Ambulatory Visit: Payer: Self-pay | Admitting: Internal Medicine

## 2017-05-16 ENCOUNTER — Encounter: Payer: Self-pay | Admitting: Physician Assistant

## 2017-05-16 ENCOUNTER — Ambulatory Visit (HOSPITAL_COMMUNITY)
Admission: RE | Admit: 2017-05-16 | Discharge: 2017-05-16 | Disposition: A | Discharge status: Home / Self Care | Payer: 59 | Source: Ambulatory Visit | Attending: Vascular Surgery | Admitting: Vascular Surgery

## 2017-05-16 ENCOUNTER — Ambulatory Visit: Payer: 59 | Admitting: Physician Assistant

## 2017-05-16 ENCOUNTER — Telehealth: Payer: Self-pay

## 2017-05-16 VITALS — BP 120/68 | HR 88 | Temp 97.7°F | Ht 70.75 in | Wt 215.6 lb

## 2017-05-16 DIAGNOSIS — M7989 Other specified soft tissue disorders: Secondary | ICD-10-CM | POA: Diagnosis not present

## 2017-05-16 DIAGNOSIS — Z86718 Personal history of other venous thrombosis and embolism: Secondary | ICD-10-CM | POA: Diagnosis not present

## 2017-05-16 NOTE — Progress Notes (Signed)
Called pt  to inform the him that a STAT U/S for DVT has been scheduled at the   [facility] [address] [place] [address] which he was told to be there at 12:45pm today. Pt was also given the direction to the office as well.   Pt voiced understanding & hung up

## 2017-05-16 NOTE — Progress Notes (Signed)
   Subjective:    Patient ID: [name], male    DOB: 12-14-1955, 62 y.o.   MRN: [id number]  HPI 62 y.o. WM with history of HTN, CAD s/p stent 2005, DVT presents after fall from ladder 2 weeks ago, 05/07/2017. Fell 4-5 feet from ladder onto the ladder in the grass, his his left elbow, hit left side of head but denies LOC, his left thigh and left calf. He has been elevating his leg, has been worse with walking. He has swelling, some warmth, redness in his left leg. No fever, chills, no SOB, CP.   Blood pressure 120/68, pulse 88, temperature 97.7 F (36.5 C), height 5' 10.75" (1.797 m), weight 215 lb 9.6 oz (97.8 kg), SpO2 97 %.  Medications Current Outpatient Medications on File Prior to Visit  Medication Sig  . ALPRAZolam (XANAX) 0.5 MG tablet Take 1/2 to 1 tablet 2 to 3 x / day onl if needed for anxiety and please try to limit to 5 days /week to avoid addiction  . aspirin EC 81 MG tablet 2 tabs po qd  . atorvastatin (LIPITOR) 80 MG tablet TAKE ONE TABLET BY MOUTH ONE TIME DAILY FOR CHOLESTEROL  . Cholecalciferol (VITAMIN D-3) 5000 UNITS TABS Take 1 tablet by mouth daily.  Marland Kitchen esomeprazole (NEXIUM) 20 MG capsule Take 20 mg by mouth daily at 12 noon. Takes OTC 20 mg  . finasteride (PROSCAR) 5 MG tablet Take 1 tablet (5 mg total) by mouth daily. (Patient taking differently: Take 5 mg by mouth daily. Take 1/2 tablet daily)  . lisinopril (PRINIVIL,ZESTRIL) 10 MG tablet Take 1 tablet daily for BP  . sildenafil (REVATIO) 20 MG tablet TAKE 1 TO 5 TABS BY MOUTH DAILY AS NEEDED.   No current facility-administered medications on file prior to visit.     Problem list He has Hyperlipidemia; Anxiety state; Essential hypertension; ASCAD s/p PTCA/Stent (2005) ; GERD with stricture; DVT, HX OF; Prediabetes; Vitamin D deficiency; Medication management; Elevated PSA; Prostatitis; and Overweight (BMI 25.0-29.9) on their problem list.   Review of Systems See HPI    Objective:   Physical Exam   Constitutional: He is oriented to person, place, and time. He appears well-developed and well-nourished. No distress.  Eyes: Pupils are equal, round, and reactive to light. Conjunctivae are normal.  Neck: Normal range of motion. Neck supple.  Cardiovascular: Normal rate and regular rhythm.  Pulmonary/Chest: Effort normal and breath sounds normal. He has no wheezes.  Musculoskeletal: Normal range of motion. He exhibits edema (left distal leg with erythema) and tenderness (neg homen's).  Lymphadenopathy:    He has no cervical adenopathy.  Neurological: He is alert and oriented to person, place, and time. No cranial nerve deficit.  Skin: Skin is warm and dry. No rash noted.       Assessment & Plan:  Left leg swelling after a fall  Continue 2 bASA, declines starting samples of eliquis Patient has history of DVT after catheretization He has swelling, warmth, tenderness left distal leg after fall Will get [place] to rule out DVT Elevate leg, get compression stockings Any SOB, CP

## 2017-05-16 NOTE — Patient Instructions (Signed)
Deep Vein Thrombosis Deep vein thrombosis (DVT) is a condition in which a blood clot forms in a deep vein, such as a lower leg, thigh, or arm vein. A clot is blood that has thickened into a gel or solid. This condition is dangerous. It can lead to serious and even life-threatening complications if the clot travels to the lungs and causes a blockage (pulmonary embolism). It can also damage veins in the leg. This can result in leg pain, swelling, discoloration, and sores (post-thrombotic syndrome). What are the causes? This condition may be caused by:  A slowdown of blood flow.  Damage to a vein.  A condition that makes blood clot more easily.  What increases the risk? The following factors may make you more likely to develop this condition:  Being overweight.  Being elderly, especially over age 60.  Sitting or lying down for more than four hours.  Lack of physical activity (sedentary lifestyle).  Being pregnant, giving birth, or having recently given birth.  Taking medicines that contain estrogen.  Smoking.  A history of any of the following: ? Blood clots or blood clotting disease. ? Peripheral vascular disease. ? Inflammatory bowel disease. ? Cancer. ? Heart disease. ? Genetic conditions that affect how blood clots. ? Neurological diseases that affect the legs (leg paresis). ? Injury. ? Major or lengthy surgery. ? A central line placed inside a large vein.  What are the signs or symptoms? Symptoms of this condition include:  Swelling, pain, or tenderness in an arm or leg.  Warmth, redness, or discoloration in an arm or leg.  If the clot is in your leg, symptoms may be more noticeable or worse when you stand or walk. Some people do not have any symptoms. How is this diagnosed? This condition is diagnosed with:  A medical history.  A physical exam.  Tests, such as: ? Blood tests. These are done to see how your blood clots. ? Imaging tests. These are done to  check for clots. Tests may include:  Ultrasound.  CT scan.  MRI.  X-ray.  Venogram. For this test, X-rays are taken after a dye is injected into a vein.  How is this treated? Treatment for this condition depends on the cause, your risk for bleeding or developing more clots, and any medical conditions you have. Treatment may include:  Taking blood thinners (also called anticoagulants). These medicines may be taken by mouth, injected under the skin, or injected through an IV tube (catheter). These medicines prevent clots from forming.  Injecting medicine that dissolves blood clots into the affected vein (catheter-directed thrombolysis).  Having surgery. Surgery may be done to: ? Remove the clot. ? Place a filter in a large vein to catch blood clots before they reach the lungs.  Some treatments may be continued for up to six months. Follow these instructions at home: If you are taking an oral blood thinner:  Take the medicine exactly as told by your health care provider. Some blood thinners need to be taken at the same time every day. Do not skip a dose.  Ask your health care provider about what foods and drugs interact with the medicine.  Ask about possible side effects. General instructions  Blood thinners can cause easy bruising and difficulty stopping bleeding. Because of this, if you are taking or were given a blood thinner: ? Hold pressure over cuts for longer than usual. ? Tell your dentist and other health care providers that you are taking blood thinners before   having any procedures that can cause bleeding. ? Avoid contact sports.  Take over-the-counter and prescription medicines only as told by your health care provider.  Return to your normal activities as told by your health care provider. Ask your health care provider what activities are safe for you.  Wear compression stockings if recommended by your health care provider.  Keep all follow-up visits as told by  your health care provider. This is important. How is this prevented? To lower your risk of developing this condition again:  For 30 or more minutes every day, do an activity that: ? Involves moving your arms and legs. ? Increases your heart rate.  When traveling for longer than four hours: ? Exercise your arms and legs every hour. ? Drink plenty of water. ? Avoid drinking alcohol.  Avoid sitting or lying for a long time without moving your legs.  Stay a healthy weight.  If you are a woman who is older than age 35, avoid unnecessary use of medicines that contain estrogen.  Do not use any products that contain nicotine or tobacco, such as cigarettes and e-cigarettes. This is especially important if you take estrogen medicines. If you need help quitting, ask your health care provider.  Contact a health care provider if:  You miss a dose of your blood thinner.  You have nausea, vomiting, or diarrhea that lasts for more than one day.  Your menstrual period is heavier than usual.  You have unusual bruising. Get help right away if:  You have new or increased pain, swelling, or redness in an arm or leg.  You have numbness or tingling in an arm or leg.  You have shortness of breath.  You have chest pain.  You have a rapid or irregular heartbeat.  You feel light-headed or dizzy.  You cough up blood.  There is blood in your vomit, stool, or urine.  You have a serious fall or accident, or you hit your head.  You have a severe headache or confusion.  You have a cut that will not stop bleeding. These symptoms may represent a serious problem that is an emergency. Do not wait to see if the symptoms will go away. Get medical help right away. Call your local emergency services (911 in the [place] Do not drive yourself to the hospital. Summary  DVT is a condition in which a blood clot forms in a deep vein, such as a lower leg, thigh, or arm vein.  Symptoms can include swelling,  warmth, pain, and redness in your leg or arm.  Treatment may include taking blood thinners, injecting medicine that dissolves blood clots,wearing compression stockings, or surgery.  If you are prescribed blood thinners, take them exactly as told. This information is not intended to replace advice given to you by your health care provider. Make sure you discuss any questions you have with your health care provider. Document Released: 01/24/2005 Document Revised: 02/27/2016 Document Reviewed: 02/27/2016 Elsevier Interactive Patient Education  2018 [employer]  

## 2017-05-16 NOTE — Telephone Encounter (Signed)
Called pt  to inform the him that a STAT U/S for DVT has been scheduled at the   [facility] [address] [place] [address] which he was told to be there at 12:45pm today. Pt was also given the direction to the office as well.   Pt voiced understanding & hung up

## 2017-06-01 ENCOUNTER — Encounter: Payer: Self-pay | Admitting: Internal Medicine

## 2017-06-02 ENCOUNTER — Other Ambulatory Visit: Payer: Self-pay | Admitting: Adult Health

## 2017-06-02 DIAGNOSIS — F419 Anxiety disorder, unspecified: Secondary | ICD-10-CM

## 2017-06-15 ENCOUNTER — Encounter: Payer: Self-pay | Admitting: Internal Medicine

## 2017-07-04 NOTE — Progress Notes (Signed)
FOLLOW UP  Assessment and Plan:   Edema - elevate legs TID, increase activity, increase water, decrease sodium intake.  Wear compression socks more routinely if available. Return to the office if no change with symptoms. Check labs.  Hypertension Well controlled with current medications  Monitor blood pressure at home; patient to call if consistently greater than 130/80 Continue DASH diet.   Reminder to go to the ER if any CP, SOB, nausea, dizziness, severe HA, changes vision/speech, left arm numbness and tingling and jaw pain.  Cholesterol Currently remains mildly above goal; discussed increasing atorvastatin to 80 mg pending results from today Continue low cholesterol diet and exercise.  Check lipid panel.   Prediabetes Continue diet and exercise.  Perform daily foot/skin check, notify office of any concerning changes.  Check A1C  Overweight Long discussion about weight loss, diet, and exercise Recommended diet heavy in fruits and veggies and low in animal meats, cheeses, and dairy products, appropriate calorie intake Discussed ideal weight for height  and initial weight goal (209 lb) Patient will work on eliminating soft drinks and chips, reducing dinner portions Will follow up in 3 months  Vitamin D Def Below goal at last visit; continue supplementation to maintain goal of 70-100 Check Vit D level  Anxiety Well managed by current regimen; continue medications; limit benzo use Stress management techniques discussed, increase water, good sleep hygiene discussed, increase exercise, and increase veggies.   GERD Well managed on current medications Discussed diet, avoiding triggers and other lifestyle changes   Continue diet and meds as discussed. Further disposition pending results of labs. Discussed med's effects and SE's.   Over 30 minutes of exam, counseling, chart review, and critical decision making was performed.   Future Appointments  Date Time Provider  [place]  07/26/2017  9:00 AM [facility]  08/09/2017 11:30 AM Gatha Mayer, MD [facility]  10/23/2017 10:00 AM Unk Pinto, MD [facility] None    ----------------------------------------------------------------------------------------------------------------------  HPI 62 y.o. male male with hx of  ASCAD s/p PTCA/Stent (2005) presents for 3 month follow up on hypertension, cholesterol, diabetes, weight and vitamin D deficiency. He presents today with new onset x 4 weeks pitting edema of ankles. Denies other symptoms or cardiac accompaniments. He is established with cardiology but has not seen in over a year, no ECHO on file to review.   he has a diagnosis of anxiety and is currently on xanax 0.25-0.5 mg PRN BID or TID, reports symptoms are well controlled on current regimen. he currently reports taking 2-3 or so weekly, typically at night to help with sleep.   BMI is Body mass index is 30.17 kg/m., he has not been working on diet and exercise.  Wt Readings from Last 3 Encounters:  07/05/17 214 lb 12.8 oz (97.4 kg)  05/16/17 215 lb 9.6 oz (97.8 kg)  05/04/17 209 lb 12.8 oz (95.2 kg)   His blood pressure has been controlled at home, today their BP is BP: 122/80  He does workout. He denies chest pain, shortness of breath, dizziness.   He is on cholesterol medication (atorvastatin 40 mg daily) and denies myalgias. His cholesterol is not at goal. The cholesterol last visit was:   Lab Results  Component Value Date   CHOL 199 03/30/2017   HDL 51 03/30/2017   LDLCALC 113 (H) 03/30/2017   LDLDIRECT 190.6 07/30/2010   TRIG 234 (H) 03/30/2017   CHOLHDL 3.9 03/30/2017    He has not been working on diet  and exercise for prediabetes, and denies foot ulcerations, increased appetite, nausea, paresthesia of the feet, polydipsia, polyuria, visual disturbances, vomiting and weight loss. Last A1C in the office was:  Lab Results  Component Value Date    HGBA1C 6.1 (H) 03/30/2017   Patient is on Vitamin D supplement.    Lab Results  Component Value Date   VD25OH 40 12/27/2016        Current Medications:  Current Outpatient Medications on File Prior to Visit  Medication Sig  . ALPRAZolam (XANAX) 0.5 MG tablet TAKE 1/2-1 TAB 2-3 TIMES DAILY ONLY IF NEEDED FOR ANXIETY. LIMIT TO 5 DAYS/WEEK TO AVOID ADDICTION  . aspirin EC 81 MG tablet 2 tabs po qd  . atorvastatin (LIPITOR) 80 MG tablet TAKE ONE TABLET BY MOUTH ONE TIME DAILY FOR CHOLESTEROL (Patient taking differently: TAKE ONE HALF TABLET BY MOUTH ONE TIME DAILY FOR CHOLESTEROL)  . Cholecalciferol (VITAMIN D-3) 5000 UNITS TABS Take 1 tablet by mouth daily.  Marland Kitchen esomeprazole (NEXIUM) 20 MG capsule Take 20 mg by mouth daily at 12 noon. Takes OTC 20 mg  . finasteride (PROSCAR) 5 MG tablet Take 1 tablet (5 mg total) by mouth daily. (Patient taking differently: Take 5 mg by mouth daily. Take 1/2 tablet daily)  . lisinopril (PRINIVIL,ZESTRIL) 10 MG tablet Take 1 tablet daily for BP  . sildenafil (REVATIO) 20 MG tablet TAKE 1 TO 5 TABS BY MOUTH DAILY AS NEEDED.   No current facility-administered medications on file prior to visit.      Allergies:  Allergies  Allergen Reactions  . Penicillins     unspecified     Medical History:  Past Medical History:  Diagnosis Date  . ANXIETY 12/25/2007  . ANXIETY STATE NEC 11/01/2006  . CORONARY ARTERY DISEASE 08/02/2006   off Plavix x 3 years  . DIVERTICULITIS OF COLON 01/06/2010  . DVT, HX OF 08/02/2006  . 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 HYPERLIPIDEMIA 08/02/2006  . HYPERTENSION 08/05/2008  . Hypothyroidism 03/19/2014  . Memory loss 07/21/2009  . Pilonidal cyst    low back pain   Family history- Reviewed and unchanged Social history- Reviewed and unchanged   Review of Systems:  Review of Systems  Constitutional: Negative for malaise/fatigue and weight loss.  HENT: Negative for hearing loss and tinnitus.   Eyes: Negative for blurred vision  and double vision.  Respiratory: Negative for cough, shortness of breath and wheezing.   Cardiovascular: Positive for leg swelling. Negative for chest pain, palpitations, orthopnea, claudication and PND.  Gastrointestinal: Negative for abdominal pain, blood in stool, constipation, diarrhea, heartburn, melena, nausea and vomiting.  Genitourinary: Negative.   Musculoskeletal: Negative for joint pain and myalgias.  Skin: Negative for rash.  Neurological: Negative for dizziness, tingling, sensory change, weakness and headaches.  Endo/Heme/Allergies: Negative for polydipsia.  Psychiatric/Behavioral: Negative.   All other systems reviewed and are negative.    Physical Exam: BP 122/80   Pulse 83   Temp 97.9 F (36.6 C)   Ht 5' 10.75" (1.797 m)   Wt 214 lb 12.8 oz (97.4 kg)   SpO2 97%   BMI 30.17 kg/m  Wt Readings from Last 3 Encounters:  07/05/17 214 lb 12.8 oz (97.4 kg)  05/16/17 215 lb 9.6 oz (97.8 kg)  05/04/17 209 lb 12.8 oz (95.2 kg)   General Appearance: Well nourished, in no apparent distress. Eyes: PERRLA, EOMs, conjunctiva no swelling or erythema Sinuses: No Frontal/maxillary tenderness ENT/Mouth: Ext aud canals clear, TMs without erythema, bulging. No erythema, swelling,  or exudate on post pharynx.  Tonsils not swollen or erythematous. Hearing normal.  Neck: Supple, thyroid normal.  Respiratory: Respiratory effort normal, BS equal bilaterally without rales, rhonchi, wheezing or stridor.  Cardio: RRR with no MRGs. Brisk peripheral pulses with bilateral ankles 2+ pitting edema.  Abdomen: Soft, + BS.  Non tender, no guarding, rebound, hernias, masses. Lymphatics: Non tender without lymphadenopathy.  Musculoskeletal: Full ROM, 5/5 strength, Normal gait Skin: Warm, dry without rashes, lesions, ecchymosis.  Neuro: Cranial nerves intact. No cerebellar symptoms.  Psych: Awake and oriented X 3, normal affect, Insight and Judgment appropriate.    Francis Ribas, NP 11:17  AM [facility]

## 2017-07-05 ENCOUNTER — Ambulatory Visit: Payer: 59 | Admitting: Adult Health

## 2017-07-05 ENCOUNTER — Encounter: Payer: Self-pay | Admitting: Adult Health

## 2017-07-05 VITALS — BP 122/80 | HR 83 | Temp 97.9°F | Ht 70.75 in | Wt 214.8 lb

## 2017-07-05 DIAGNOSIS — R600 Localized edema: Secondary | ICD-10-CM | POA: Diagnosis not present

## 2017-07-05 DIAGNOSIS — Z79899 Other long term (current) drug therapy: Secondary | ICD-10-CM | POA: Diagnosis not present

## 2017-07-05 DIAGNOSIS — K219 Gastro-esophageal reflux disease without esophagitis: Secondary | ICD-10-CM

## 2017-07-05 DIAGNOSIS — F411 Generalized anxiety disorder: Secondary | ICD-10-CM

## 2017-07-05 DIAGNOSIS — K222 Esophageal obstruction: Secondary | ICD-10-CM | POA: Diagnosis not present

## 2017-07-05 DIAGNOSIS — E559 Vitamin D deficiency, unspecified: Secondary | ICD-10-CM | POA: Diagnosis not present

## 2017-07-05 DIAGNOSIS — E663 Overweight: Secondary | ICD-10-CM | POA: Diagnosis not present

## 2017-07-05 DIAGNOSIS — R7303 Prediabetes: Secondary | ICD-10-CM

## 2017-07-05 DIAGNOSIS — I10 Essential (primary) hypertension: Secondary | ICD-10-CM | POA: Diagnosis not present

## 2017-07-05 DIAGNOSIS — R7309 Other abnormal glucose: Secondary | ICD-10-CM | POA: Diagnosis not present

## 2017-07-05 DIAGNOSIS — E782 Mixed hyperlipidemia: Secondary | ICD-10-CM

## 2017-07-05 NOTE — Patient Instructions (Signed)
We will hold off on adding lasix for now - please let me know if swelling doesn't improve with watching your salt, fluids, elevating legs, wearing compression hose   We may have you follow up with cardiology for an ECHO test to check out your hear if labs are normal and symptoms do not improve   Edema Edema is an abnormal buildup of fluids in your bodytissues. Edema is somewhatdependent on gravity to pull the fluid to the lowest place in your body. That makes the condition more common in the legs and thighs (lower extremities). Painless swelling of the feet and ankles is common and becomes more likely as you get older. It is also common in looser tissues, like around your eyes. When the affected area is squeezed, the fluid may move out of that spot and leave a dent for a few moments. This dent is called pitting. What are the causes? There are many possible causes of edema. Eating too much salt and being on your feet or sitting for a long time can cause edema in your legs and ankles. Hot weather may make edema worse. Common medical causes of edema include:  Heart failure.  Liver disease.  Kidney disease.  Weak blood vessels in your legs.  Cancer.  An injury.  Pregnancy.  Some medications.  Obesity.  What are the signs or symptoms? Edema is usually painless.Your skin may look swollen or shiny. How is this diagnosed? Your health care provider may be able to diagnose edema by asking about your medical history and doing a physical exam. You may need to have tests such as X-rays, an electrocardiogram, or blood tests to check for medical conditions that may cause edema. How is this treated? Edema treatment depends on the cause. If you have heart, liver, or kidney disease, you need the treatment appropriate for these conditions. General treatment may include:  Elevation of the affected body part above the level of your heart.  Compression of the affected body part. Pressure from  elastic bandages or support stockings squeezes the tissues and forces fluid back into the blood vessels. This keeps fluid from entering the tissues.  Restriction of fluid and salt intake.  Use of a water pill (diuretic). These medications are appropriate only for some types of edema. They pull fluid out of your body and make you urinate more often. This gets rid of fluid and reduces swelling, but diuretics can have side effects. Only use diuretics as directed by your health care provider.  Follow these instructions at home:  Keep the affected body part above the level of your heart when you are lying down.  Do not sit still or stand for prolonged periods.  Do not put anything directly under your knees when lying down.  Do not wear constricting clothing or garters on your upper legs.  Exercise your legs to work the fluid back into your blood vessels. This may help the swelling go down.  Wear elastic bandages or support stockings to reduce ankle swelling as directed by your health care provider.  Eat a low-salt diet to reduce fluid if your health care provider recommends it.  Only take medicines as directed by your health care provider. Contact a health care provider if:  Your edema is not responding to treatment.  You have heart, liver, or kidney disease and notice symptoms of edema.  You have edema in your legs that does not improve after elevating them.  You have sudden and unexplained weight gain. Get  help right away if:  You develop shortness of breath or chest pain.  You cannot breathe when you lie down.  You develop pain, redness, or warmth in the swollen areas.  You have heart, liver, or kidney disease and suddenly get edema.  You have a fever and your symptoms suddenly get worse. This information is not intended to replace advice given to you by your health care provider. Make sure you discuss any questions you have with your health care provider. Document Released:  01/24/2005 Document Revised: 07/02/2015 Document Reviewed: 11/16/2012 Elsevier Interactive Patient Education  2017 [employer].

## 2017-07-06 LAB — COMPLETE METABOLIC PANEL WITH GFR
AG Ratio: 2.1 (calc) (ref 1.0–2.5)
ALKALINE PHOSPHATASE (APISO): 58 U/L (ref 40–115)
ALT: 15 U/L (ref 9–46)
AST: 13 U/L (ref 10–35)
Albumin: 4.6 g/dL (ref 3.6–5.1)
BUN: 20 mg/dL (ref 7–25)
CALCIUM: 9.6 mg/dL (ref 8.6–10.3)
CO2: 25 mmol/L (ref 20–32)
CREATININE: 1.23 mg/dL (ref 0.70–1.25)
Chloride: 105 mmol/L (ref 98–110)
GFR, EST NON AFRICAN AMERICAN: 62 mL/min/{1.73_m2} (ref >=60)
GFR, Est African American: 72 mL/min/{1.73_m2} (ref >=60)
GLUCOSE: 90 mg/dL (ref 65–99)
Globulin: 2.2 g/dL (calc) (ref 1.9–3.7)
Potassium: 4.4 mmol/L (ref 3.5–5.3)
SODIUM: 138 mmol/L (ref 135–146)
Total Bilirubin: 0.6 mg/dL (ref 0.2–1.2)
Total Protein: 6.8 g/dL (ref 6.1–8.1)

## 2017-07-06 LAB — CBC WITH DIFFERENTIAL/PLATELET
Basophils Absolute: 49 cells/uL (ref 0–200)
Basophils Relative: 0.6 %
EOS PCT: 1.2 %
Eosinophils Absolute: 97 cells/uL (ref 15–500)
HCT: 42.4 % (ref 38.5–50.0)
Hemoglobin: 14.5 g/dL (ref 13.2–17.1)
Lymphs Abs: 1766 cells/uL (ref 850–3900)
MCH: 30.7 pg (ref 27.0–33.0)
MCHC: 34.2 g/dL (ref 32.0–36.0)
MCV: 89.6 fL (ref 80.0–100.0)
MONOS PCT: 10 %
MPV: 11.9 fL (ref 7.5–12.5)
NEUTROS PCT: 66.4 %
Neutro Abs: 5378 cells/uL (ref 1500–7800)
PLATELETS: 318 10*3/uL (ref 140–400)
RBC: 4.73 10*6/uL (ref 4.20–5.80)
RDW: 12.8 % (ref 11.0–15.0)
TOTAL LYMPHOCYTE: 21.8 %
WBC: 8.1 10*3/uL (ref 3.8–10.8)
WBCMIX: 810 {cells}/uL (ref 200–950)

## 2017-07-06 LAB — URINALYSIS, ROUTINE W REFLEX MICROSCOPIC
Bilirubin Urine: NEGATIVE
Glucose, UA: NEGATIVE
Hgb urine dipstick: NEGATIVE
LEUKOCYTES UA: NEGATIVE
NITRITE: NEGATIVE
Protein, ur: NEGATIVE
SPECIFIC GRAVITY, URINE: 1.028 (ref 1.001–1.03)
pH: 6.5 (ref 5.0–8.0)

## 2017-07-06 LAB — MAGNESIUM: MAGNESIUM: 2.2 mg/dL (ref 1.5–2.5)

## 2017-07-06 LAB — LIPID PANEL
CHOLESTEROL: 186 mg/dL (ref <200)
HDL: 50 mg/dL (ref >40)
LDL CHOLESTEROL (CALC): 115 mg/dL — AB
Non-HDL Cholesterol (Calc): 136 mg/dL (calc) — ABNORMAL HIGH (ref <130)
TRIGLYCERIDES: 105 mg/dL (ref <150)
Total CHOL/HDL Ratio: 3.7 (calc) (ref <5.0)

## 2017-07-06 LAB — HEMOGLOBIN A1C
Hgb A1c MFr Bld: 5.8 % of total Hgb — ABNORMAL HIGH (ref <5.7)
MEAN PLASMA GLUCOSE: 120 (calc)
eAG (mmol/L): 6.6 (calc)

## 2017-07-06 LAB — VITAMIN D 25 HYDROXY (VIT D DEFICIENCY, FRACTURES): Vit D, 25-Hydroxy: 67 ng/mL (ref 30–100)

## 2017-07-06 LAB — TSH: TSH: 2.41 m[IU]/L (ref 0.40–4.50)

## 2017-07-26 ENCOUNTER — Other Ambulatory Visit: Payer: Self-pay

## 2017-07-26 ENCOUNTER — Ambulatory Visit (AMBULATORY_SURGERY_CENTER): Payer: Self-pay | Admitting: *Deleted

## 2017-07-26 VITALS — Ht 70.0 in | Wt 209.0 lb

## 2017-07-26 DIAGNOSIS — Z86010 Personal history of colonic polyps: Secondary | ICD-10-CM

## 2017-07-26 DIAGNOSIS — Z800 Family history of malignant neoplasm of digestive organs: Secondary | ICD-10-CM

## 2017-07-26 NOTE — Progress Notes (Signed)
No egg or soy allergy known to patient  No issues with past sedation with any surgeries  or procedures, no intubation problems  No diet pills per patient No home 02 use per patient  No blood thinners per patient  Pt denies issues with constipation  No A fib or A flutter  EMMI video offered patient declined.

## 2017-08-09 ENCOUNTER — Ambulatory Visit (AMBULATORY_SURGERY_CENTER): Payer: 59 | Admitting: Internal Medicine

## 2017-08-09 ENCOUNTER — Encounter: Payer: Self-pay | Admitting: Internal Medicine

## 2017-08-09 VITALS — BP 106/54 | HR 75 | Temp 97.8°F | Resp 11 | Ht 70.0 in | Wt 214.0 lb

## 2017-08-09 DIAGNOSIS — Z86010 Personal history of colonic polyps: Secondary | ICD-10-CM | POA: Diagnosis not present

## 2017-08-09 DIAGNOSIS — D123 Benign neoplasm of transverse colon: Secondary | ICD-10-CM

## 2017-08-09 DIAGNOSIS — D120 Benign neoplasm of cecum: Secondary | ICD-10-CM | POA: Diagnosis not present

## 2017-08-09 MED ORDER — SODIUM CHLORIDE 0.9 % IV SOLN: 500.0000 mL | Freq: Once | INTRAVENOUS | Status: DC

## 2017-08-09 NOTE — Progress Notes (Signed)
A/ox3 pleased with MAC, report to RN 

## 2017-08-09 NOTE — Progress Notes (Signed)
Called to room to assist during endoscopic procedure.  Patient ID and intended procedure confirmed with present staff. Received instructions for my participation in the procedure from the performing physician.  

## 2017-08-09 NOTE — Patient Instructions (Addendum)
I found and removed 3 tiny polyps. I will let you know pathology results and when to have another routine colonoscopy by mail and/or My Chart.  You also have a condition called diverticulosis - common and not usually a problem. Please read the handout provided.   I appreciate the opportunity to care for you. Gatha Mayer, MD, FACG YOU HAD AN ENDOSCOPIC PROCEDURE TODAY AT [facility]:   Refer to the procedure report that was given to you for any specific questions about what was found during the examination.  If the procedure report does not answer your questions, please call your gastroenterologist to clarify.  If you requested that your care partner not be given the details of your procedure findings, then the procedure report has been included in a sealed envelope for you to review at your convenience later.  YOU SHOULD EXPECT: Some feelings of bloating in the abdomen. Passage of more gas than usual.  Walking can help get rid of the air that was put into your GI tract during the procedure and reduce the bloating. If you had a lower endoscopy (such as a colonoscopy or flexible sigmoidoscopy) you may notice spotting of blood in your stool or on the toilet paper. If you underwent a bowel prep for your procedure, you may not have a normal bowel movement for a few days.  Please Note:  You might notice some irritation and congestion in your nose or some drainage.  This is from the oxygen used during your procedure.  There is no need for concern and it should clear up in a day or so.  SYMPTOMS TO REPORT IMMEDIATELY:   Following lower endoscopy (colonoscopy or flexible sigmoidoscopy):  Excessive amounts of blood in the stool  Significant tenderness or worsening of abdominal pains  Swelling of the abdomen that is new, acute  Fever of 100F or higher   Following upper endoscopy (EGD)  Vomiting of blood or coffee ground material  New chest pain or pain under the shoulder  blades  Painful or persistently difficult swallowing  New shortness of breath  Fever of 100F or higher  Black, tarry-looking stools  For urgent or emergent issues, a gastroenterologist can be reached at any hour by calling [phone].   DIET:  We do recommend a small meal at first, but then you may proceed to your regular diet.  Drink plenty of fluids but you should avoid alcoholic beverages for 24 hours.  ACTIVITY:  You should plan to take it easy for the rest of today and you should NOT DRIVE or use heavy machinery until tomorrow (because of the sedation medicines used during the test).    FOLLOW UP: Our staff will call the number listed on your records the next business day following your procedure to check on you and address any questions or concerns that you may have regarding the information given to you following your procedure. If we do not reach you, we will leave a message.  However, if you are feeling well and you are not experiencing any problems, there is no need to return our call.  We will assume that you have returned to your regular daily activities without incident.  If any biopsies were taken you will be contacted by phone or by letter within the next 1-3 weeks.  Please call us at [phone] if you have not heard about the biopsies in 3 weeks.    SIGNATURES/CONFIDENTIALITY: You and/or your care partner have  signed paperwork which will be entered into your electronic medical record.  These signatures attest to the fact that that the information above on your After Visit Summary has been reviewed and is understood.  Full responsibility of the confidentiality of this discharge information lies with you and/or your care-partner.  Polyp information given.

## 2017-08-09 NOTE — Progress Notes (Signed)
Pt's states no medical or surgical changes since previsit or office visit. 

## 2017-08-09 NOTE — Op Note (Signed)
[place] Patient Name: [name] Procedure Date: 08/09/2017 11:58 AM MRN: [id number] Endoscopist: Gatha Mayer , MD Age: 62 Referring MD:  Date of Birth: Jan 02, 1956 Gender: Male Account #: [id number] Procedure:                Colonoscopy Indications:              Surveillance: Personal history of adenomatous                            polyps on last colonoscopy > 5 years ago Medicines:                Propofol per Anesthesia, Monitored Anesthesia Care Procedure:                Pre-Anesthesia Assessment:                           - Prior to the procedure, a History and Physical                            was performed, and patient medications and                            allergies were reviewed. The patient's tolerance of                            previous anesthesia was also reviewed. The risks                            and benefits of the procedure and the sedation                            options and risks were discussed with the patient.                            All questions were answered, and informed consent                            was obtained. Prior Anticoagulants: The patient has                            taken no previous anticoagulant or antiplatelet                            agents. ASA Grade Assessment: III - A patient with                            severe systemic disease. After reviewing the risks                            and benefits, the patient was deemed in                            satisfactory condition to undergo the procedure.  After obtaining informed consent, the colonoscope                            was passed under direct vision. Throughout the                            procedure, the patient's blood pressure, pulse, and                            oxygen saturations were monitored continuously. The                            Colonoscope was introduced through the anus and    advanced to the the cecum, identified by                            appendiceal orifice and ileocecal valve. The                            colonoscopy was performed without difficulty. The                            patient tolerated the procedure well. The quality                            of the bowel preparation was good. The ileocecal                            valve, appendiceal orifice, and rectum were                            photographed. The bowel preparation used was                            Miralax. Scope In: 12:07:35 PM Scope Out: 12:20:54 PM Scope Withdrawal Time: 0 hours 9 minutes 57 seconds  Total Procedure Duration: 0 hours 13 minutes 19 seconds  Findings:                 The perianal and digital rectal examinations were                            normal. Pertinent negatives include normal prostate                            (size, shape, and consistency).                           A diminutive polyp was found in the transverse                            colon. The polyp was sessile. The polyp was removed                            with a cold snare. Resection and retrieval  were                            complete. Verification of patient identification                            for the specimen was done. Estimated blood loss was                            minimal.                           Two sessile polyps were found in the cecum. The                            polyps were 1 to 2 mm in size. These polyps were                            removed with a cold biopsy forceps. Resection and                            retrieval were complete. Verification of patient                            identification for the specimen was done. Estimated                            blood loss was minimal.                           Diverticula were found in the sigmoid colon.                           The exam was otherwise without abnormality on                            direct  and retroflexion views. Complications:            No immediate complications. Estimated Blood Loss:     Estimated blood loss was minimal. Impression:               - One diminutive polyp in the transverse colon,                            removed with a cold snare. Resected and retrieved.                           - Two 1 to 2 mm polyps in the cecum, removed with a                            cold biopsy forceps. Resected and retrieved.                           - Diverticulosis in the sigmoid colon.                           -  The examination was otherwise normal on direct                            and retroflexion views.                           - Personal history of colonic polyps 1 adenoma 2012. Recommendation:           - Patient has a contact number available for                            emergencies. The signs and symptoms of potential                            delayed complications were discussed with the                            patient. Return to normal activities tomorrow.                            Written discharge instructions were provided to the                            patient.                           - Resume previous diet.                           - Continue present medications.                           - Repeat colonoscopy is recommended for                            surveillance. The colonoscopy date will be                            determined after pathology results from today's                            exam become available for review. Gatha Mayer, MD 08/09/2017 12:34:05 PM This report has been signed electronically.

## 2017-08-14 ENCOUNTER — Telehealth: Payer: Self-pay | Admitting: *Deleted

## 2017-08-14 ENCOUNTER — Telehealth: Payer: Self-pay

## 2017-08-14 NOTE — Telephone Encounter (Signed)
  Follow up Call-  Call back number 08/09/2017  Post procedure Call Back phone  # [phone]  Permission to leave phone message Yes  Some recent data might be hidden    [facility]

## 2017-08-14 NOTE — Telephone Encounter (Signed)
  Follow up Call-  Call back number 08/09/2017  Post procedure Call Back phone  # [phone]  Permission to leave phone message Yes  Some recent data might be hidden     Patient questions:  Do you have a fever, pain , or abdominal swelling? No. Pain Score  0 *  Have you tolerated food without any problems? Yes.    Have you been able to return to your normal activities? Yes.    Do you have any questions about your discharge instructions: Diet   No. Medications  No. Follow up visit  No.  Do you have questions or concerns about your Care? No.  Actions: * If pain score is 4 or above: No action needed, pain <4.

## 2017-08-18 ENCOUNTER — Encounter: Payer: Self-pay | Admitting: Internal Medicine

## 2017-08-18 NOTE — Progress Notes (Signed)
3 tiny adenomas Recall 2024 My Chart

## 2017-09-19 ENCOUNTER — Other Ambulatory Visit: Payer: Self-pay | Admitting: Internal Medicine

## 2017-09-24 ENCOUNTER — Other Ambulatory Visit: Payer: Self-pay | Admitting: Internal Medicine

## 2017-10-22 ENCOUNTER — Encounter: Payer: Self-pay | Admitting: Internal Medicine

## 2017-10-22 NOTE — Progress Notes (Addendum)
[facility]   Unk Pinto, M.D.     Uvaldo Bristle. Silverio Lay, P.A.-C Liane Comber, [place]                [address] [place] [address] Telephone [phone] Telefax [phone] Annual  Screening/Preventative Visit  & Comprehensive Evaluation & Examination     This very nice 62 y.o. MWM presents for a Screening /Preventative Visit & comprehensive evaluation and management of multiple medical co-morbidities.  Patient has been followed for HTN, HLD, Prediabetes and Vitamin D Deficiency. Patient has hx/o GERD controlled w/diet & meds. Patient has hx/o BPH/LUTS followed n=by Dr Diona Fanti.     HTN predates circa 2010. Patient's BP has been controlled at home.  Today's BP is at goal - 128/80.  In 2008, he underwent  PCA/Stenting by Dr Dorris Carnes.  Cardiolite was negative in 2015. Patient denies any cardiac symptoms as chest pain, palpitations, shortness of breath, dizziness or ankle swelling.     Patient's hyperlipidemia is not controlled with diet and medications. Patient denies myalgias or other medication SE's. Last lipids were not at goal: Lab Results  Component Value Date   CHOL 186 07/05/2017   HDL 50 07/05/2017   LDLCALC 115 (H) 07/05/2017   LDLDIRECT 190.6 07/30/2010   TRIG 105 07/05/2017   CHOLHDL 3.7 07/05/2017      Patient has hx/o prediabetes (A1c 6.1%/2014  & 6.3%/2015) and patient denies reactive hypoglycemic symptoms, visual blurring, diabetic polys or paresthesias. Last A1c was not at goal: Lab Results  Component Value Date   HGBA1C 5.8 (H) 07/05/2017       Finally, patient has history of Vitamin D Deficiency ("43"/2014 and "30"/Feb 2016) and last vitamin D was at goal: Lab Results  Component Value Date   VD25OH 67 07/05/2017   Current Outpatient Medications on File Prior to Visit  Medication Sig  . ALPRAZolam (XANAX) 0.5 MG tablet TAKE 1/2-1 TAB 2-3 TIMES DAILY ONLY  IF NEEDED FOR ANXIETY. LIMIT TO 5 DAYS/WEEK TO AVOID ADDICTION  . aspirin EC 81 MG tablet 2 tabs po qd  . atorvastatin (LIPITOR) 80 MG tablet TAKE ONE TABLET BY MOUTH ONE TIME DAILY FOR CHOLESTEROL (Patient taking differently: TAKE ONE HALF TABLET BY MOUTH ONE TIME DAILY FOR CHOLESTEROL)  . Cholecalciferol (VITAMIN D-3) 5000 UNITS TABS Take 1 tablet by mouth 2 (two) times daily.   . Cyanocobalamin (VITAMIN B 12 PO) Take 1 tablet by mouth daily.  Marland Kitchen esomeprazole (NEXIUM) 20 MG capsule Take 20 mg by mouth daily at 12 noon. Takes OTC 20 mg  . finasteride (PROSCAR) 5 MG tablet TAKE 1 TABLET BY MOUTH EVERY DAY  . lisinopril (PRINIVIL,ZESTRIL) 10 MG tablet TAKE 1 TABLET BY MOUTH ONCE DAILY FOR BLOOD PRESSURE  . sildenafil (REVATIO) 20 MG tablet TAKE 1 TO 5 TABS BY MOUTH DAILY AS NEEDED.   No current facility-administered medications on file prior to visit.    Allergies  Allergen Reactions  . Penicillins     unspecified   Past Medical History:  Diagnosis Date  . ANXIETY 12/25/2007  . ANXIETY STATE NEC 11/01/2006  . CORONARY ARTERY DISEASE 08/02/2006   off Plavix x 3 years  . DIVERTICULITIS OF COLON 01/06/2010  . DVT, HX OF 08/02/2006  . 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 Hx of adenomatous polyp of colon 06/09/2010  . HYPERLIPIDEMIA 08/02/2006  .  HYPERTENSION 08/05/2008  . Hypothyroidism 03/19/2014  . Memory loss 07/21/2009  . Pilonidal cyst    low back pain   Health Maintenance  Topic Date Due  . INFLUENZA VACCINE  11/30/2017 (Originally 09/07/2017)  . TETANUS/TDAP  07/29/2020  . COLONOSCOPY  08/10/2022  . Hepatitis C Screening  Completed  . HIV Screening  Completed   Immunization History  Administered Date(s) Administered  . PPD Test 07/24/2013, 07/30/2014, 09/16/2016  . Tdap 07/30/2010   Last Colon - 08/09/2017 - Dr Carlean Purl - recc f/u 5 yr in July 2024  Past Surgical History:  Procedure Laterality Date  . COLONOSCOPY    . COLONOSCOPY W/ POLYPECTOMY  06/16/2010   diminutive adenoma,  diverticulosis, hemorrhoids  . CORONARY ANGIOPLASTY WITH STENT PLACEMENT    . ESOPHAGOGASTRODUODENOSCOPY  06/16/2010   antral ulcers, esophageal stricture - dilated, duodenitis, hiatal hernia  . PILONIDAL CYST EXCISION     teenager  . POLYPECTOMY    . UPPER GASTROINTESTINAL ENDOSCOPY     Family History  Problem Relation Age of Onset  . Colon cancer Father        deceased age 56  . Dementia Mother   . Breast cancer Neg Hx   . Colon polyps Neg Hx   . Esophageal cancer Neg Hx   . Liver cancer Neg Hx   . Pancreatic cancer Neg Hx   . Rectal cancer Neg Hx   . Stomach cancer Neg Hx    Social History   Socioeconomic History  . Marital status: Married    Spouse name:   . Number of children: 2  Occupational History  . Occupation: Psychologist, occupational: [employer]   Tobacco Use  . Smoking status: Never Smoker  . Smokeless tobacco: Never Used  Substance and Sexual Activity  . Alcohol use: Yes    Comment: approx 2 drinks per week  . Drug use: Yes    Comment: marjuana as per 2011 Echart H&P denies any at this time (06/2010  . Sexual activity: No  Social History Narrative   Daily caffeine    ROS Constitutional: Denies fever, chills, weight loss/gain, headaches, insomnia,  night sweats or change in appetite. Does c/o fatigue. Eyes: Denies redness, blurred vision, diplopia, discharge, itchy or watery eyes.  ENT: Denies discharge, congestion, post nasal drip, epistaxis, sore throat, earache, hearing loss, dental pain, Tinnitus, Vertigo, Sinus pain or snoring.  Cardio: Denies chest pain, palpitations, irregular heartbeat, syncope, dyspnea, diaphoresis, orthopnea, PND, claudication or edema Respiratory: denies cough, dyspnea, DOE, pleurisy, hoarseness, laryngitis or wheezing.  Gastrointestinal: Denies dysphagia, heartburn, reflux, water brash, pain, cramps, nausea, vomiting, bloating, diarrhea, constipation, hematemesis, melena, hematochezia, jaundice or  hemorrhoids Genitourinary: Denies dysuria, frequency, urgency, nocturia, hesitancy, discharge, hematuria or flank pain Musculoskeletal: Denies arthralgia, myalgia, stiffness, Jt. Swelling, pain, limp or strain/sprain. Denies Falls. Skin: Denies puritis, rash, hives, warts, acne, eczema or change in skin lesion Neuro: No weakness, tremor, incoordination, spasms, paresthesia or pain Psychiatric: Denies confusion, memory loss or sensory loss. Denies Depression. Endocrine: Denies change in weight, skin, hair change, nocturia, and paresthesia, diabetic polys, visual blurring or hyper / hypo glycemic episodes.  Heme/Lymph: No excessive bleeding, bruising or enlarged lymph nodes.  Physical Exam  BP 128/80   Pulse 72   Temp (!) 97 F (36.1 C)   Resp 16   Ht 5\' 11"  (1.803 m)   Wt 202 lb 9.6 oz (91.9 kg)   BMI 28.26 kg/m   General Appearance: Well nourished and well groomed  and in no apparent distress.  Eyes: PERRLA, EOMs, conjunctiva no swelling or erythema, normal fundi and vessels. Sinuses: No frontal/maxillary tenderness ENT/Mouth: EACs impacted w/Cerumen. Nares clear without erythema, swelling, mucoid exudates. Oral hygiene is good. No erythema, swelling, or exudate. Tongue normal, non-obstructing. Tonsils not swollen or erythematous. Hearing normal.  Neck: Supple, thyroid not palpable. No bruits, nodes or JVD. Respiratory: Respiratory effort normal.  BS equal and clear bilateral without rales, rhonci, wheezing or stridor. Cardio: Heart sounds are normal with regular rate and rhythm and no murmurs, rubs or gallops. Peripheral pulses are normal and equal bilaterally without edema. No aortic or femoral bruits. Chest: symmetric with normal excursions and percussion.  Abdomen: Soft, with Nl bowel sounds. Nontender, no guarding, rebound, hernias, masses, or organomegaly.  Lymphatics: Non tender without lymphadenopathy.  Genitourinary: No hernias.Testes nl. DRE - prostate nl for age - smooth &  firm w/o nodules. Musculoskeletal: Full ROM all peripheral extremities, joint stability, 5/5 strength, and normal gait. Skin: Warm and dry without rashes, lesions, cyanosis, clubbing or  ecchymosis.  Neuro: Cranial nerves intact, reflexes equal bilaterally. Normal muscle tone, no cerebellar symptoms. Sensation intact.  Pysch: Alert and oriented X 3 with normal affect, insight and judgment appropriate.   Assessment and Plan  1. Annual Preventative/Screening Exam    2. Essential hypertension  - EKG 12-Lead - [place], RETROPERITNL ABD,  LTD - Urinalysis, Routine w reflex microscopic - Microalbumin / creatinine urine ratio - CBC with Differential/Platelet - COMPLETE METABOLIC PANEL WITH GFR - Magnesium - TSH  3. Hyperlipidemia, mixed  - EKG 12-Lead - [place], RETROPERITNL ABD,  LTD - Lipid panel - TSH  4. Prediabetes  - EKG 12-Lead - [place], RETROPERITNL ABD,  LTD - Hemoglobin A1c - Insulin, random  5. Vitamin D deficiency  - VITAMIN D 25 Hydroxy  6. Coronary artery disease involving native heart without angina pectoris, unspecified vessel or lesion type  - EKG 12-Lead - Urinalysis, Routine w reflex microscopic - Lipid panel  7. Gastroesophageal reflux disease  - CBC with Differential/Platelet  8. Hypothyroidism  - TSH  9. Screening for ischemic heart disease  - EKG 12-Lead - Lipid panel  10. FH: hypertension  - EKG 12-Lead - [place], RETROPERITNL ABD,  LTD  11. Screening for AAA (aortic abdominal aneurysm)  - [place], RETROPERITNL ABD,  LTD  12. Screening for colorectal cancer  - POC Hemoccult Bld/Stl  13. Elevated PSA  - PSA  14. Prostate cancer screening  - PSA  15. Screening examination for pulmonary tuberculosis  - PPD  16. Fatigue  - Vitamin B12 - CBC with Differential/Platelet - Iron,Total/Total Iron Binding Cap - Testosterone  17. Medication management  - Urinalysis, Routine w reflex microscopic - Microalbumin / creatinine urine ratio - CBC  with Differential/Platelet - COMPLETE METABOLIC PANEL WITH GFR - Magnesium - Lipid panel - TSH - Hemoglobin A1c - Insulin, random - VITAMIN D 25 Hydroxyl  18. Bilateral impacted cerumen         Patient was counseled in prudent diet, weight control to achieve/maintain BMI less than 25, BP monitoring, regular exercise and medications as discussed.  Discussed med effects and SE's. Routine screening labs and tests as requested with regular follow-up as recommended. Over 40 minutes of exam, counseling, chart review and high complex critical decision making was performed   Procedure (CPT [phone])  After informed consent, both EAC's were irrigated til clear.

## 2017-10-22 NOTE — Patient Instructions (Signed)

## 2017-10-23 ENCOUNTER — Ambulatory Visit (INDEPENDENT_AMBULATORY_CARE_PROVIDER_SITE_OTHER): Payer: 59 | Admitting: Internal Medicine

## 2017-10-23 VITALS — BP 128/80 | HR 72 | Temp 97.0°F | Resp 16 | Ht 71.0 in | Wt 202.6 lb

## 2017-10-23 DIAGNOSIS — R7303 Prediabetes: Secondary | ICD-10-CM

## 2017-10-23 DIAGNOSIS — Z131 Encounter for screening for diabetes mellitus: Secondary | ICD-10-CM | POA: Diagnosis not present

## 2017-10-23 DIAGNOSIS — Z8249 Family history of ischemic heart disease and other diseases of the circulatory system: Secondary | ICD-10-CM

## 2017-10-23 DIAGNOSIS — E559 Vitamin D deficiency, unspecified: Secondary | ICD-10-CM

## 2017-10-23 DIAGNOSIS — Z1329 Encounter for screening for other suspected endocrine disorder: Secondary | ICD-10-CM | POA: Diagnosis not present

## 2017-10-23 DIAGNOSIS — I10 Essential (primary) hypertension: Secondary | ICD-10-CM | POA: Diagnosis not present

## 2017-10-23 DIAGNOSIS — R972 Elevated prostate specific antigen [PSA]: Secondary | ICD-10-CM

## 2017-10-23 DIAGNOSIS — Z79899 Other long term (current) drug therapy: Secondary | ICD-10-CM

## 2017-10-23 DIAGNOSIS — Z0000 Encounter for general adult medical examination without abnormal findings: Secondary | ICD-10-CM | POA: Diagnosis not present

## 2017-10-23 DIAGNOSIS — Z1212 Encounter for screening for malignant neoplasm of rectum: Secondary | ICD-10-CM

## 2017-10-23 DIAGNOSIS — Z13220 Encounter for screening for lipoid disorders: Secondary | ICD-10-CM

## 2017-10-23 DIAGNOSIS — Z111 Encounter for screening for respiratory tuberculosis: Secondary | ICD-10-CM

## 2017-10-23 DIAGNOSIS — I2510 Atherosclerotic heart disease of native coronary artery without angina pectoris: Secondary | ICD-10-CM

## 2017-10-23 DIAGNOSIS — R350 Frequency of micturition: Secondary | ICD-10-CM | POA: Diagnosis not present

## 2017-10-23 DIAGNOSIS — E039 Hypothyroidism, unspecified: Secondary | ICD-10-CM

## 2017-10-23 DIAGNOSIS — R5383 Other fatigue: Secondary | ICD-10-CM

## 2017-10-23 DIAGNOSIS — Z125 Encounter for screening for malignant neoplasm of prostate: Secondary | ICD-10-CM | POA: Diagnosis not present

## 2017-10-23 DIAGNOSIS — K219 Gastro-esophageal reflux disease without esophagitis: Secondary | ICD-10-CM

## 2017-10-23 DIAGNOSIS — Z1211 Encounter for screening for malignant neoplasm of colon: Secondary | ICD-10-CM

## 2017-10-23 DIAGNOSIS — Z1389 Encounter for screening for other disorder: Secondary | ICD-10-CM

## 2017-10-23 DIAGNOSIS — Z0001 Encounter for general adult medical examination with abnormal findings: Secondary | ICD-10-CM

## 2017-10-23 DIAGNOSIS — Z136 Encounter for screening for cardiovascular disorders: Secondary | ICD-10-CM

## 2017-10-23 DIAGNOSIS — Z130 Encounter for screening for diseases of the blood and blood-forming organs and certain disorders involving the immune mechanism: Secondary | ICD-10-CM

## 2017-10-23 DIAGNOSIS — H6123 Impacted cerumen, bilateral: Secondary | ICD-10-CM

## 2017-10-23 DIAGNOSIS — N401 Enlarged prostate with lower urinary tract symptoms: Secondary | ICD-10-CM | POA: Diagnosis not present

## 2017-10-23 DIAGNOSIS — E782 Mixed hyperlipidemia: Secondary | ICD-10-CM

## 2017-10-24 LAB — CBC WITH DIFFERENTIAL/PLATELET
BASOS ABS: 48 {cells}/uL (ref 0–200)
Basophils Relative: 0.7 %
EOS PCT: 2.2 %
Eosinophils Absolute: 152 cells/uL (ref 15–500)
HEMATOCRIT: 41.4 % (ref 38.5–50.0)
Hemoglobin: 13.9 g/dL (ref 13.2–17.1)
LYMPHS ABS: 1208 {cells}/uL (ref 850–3900)
MCH: 29.8 pg (ref 27.0–33.0)
MCHC: 33.6 g/dL (ref 32.0–36.0)
MCV: 88.8 fL (ref 80.0–100.0)
MPV: 12.3 fL (ref 7.5–12.5)
Monocytes Relative: 7.4 %
NEUTROS PCT: 72.2 %
Neutro Abs: 4982 cells/uL (ref 1500–7800)
Platelets: 296 10*3/uL (ref 140–400)
RBC: 4.66 10*6/uL (ref 4.20–5.80)
RDW: 12.9 % (ref 11.0–15.0)
TOTAL LYMPHOCYTE: 17.5 %
WBC mixed population: 511 cells/uL (ref 200–950)
WBC: 6.9 10*3/uL (ref 3.8–10.8)

## 2017-10-24 LAB — URINALYSIS, ROUTINE W REFLEX MICROSCOPIC
BILIRUBIN URINE: NEGATIVE
GLUCOSE, UA: NEGATIVE
Hgb urine dipstick: NEGATIVE
Ketones, ur: NEGATIVE
LEUKOCYTES UA: NEGATIVE
Nitrite: NEGATIVE
Protein, ur: NEGATIVE
SPECIFIC GRAVITY, URINE: 1.022 (ref 1.001–1.03)

## 2017-10-24 LAB — COMPLETE METABOLIC PANEL WITH GFR
AG RATIO: 2.3 (calc) (ref 1.0–2.5)
ALT: 15 U/L (ref 9–46)
AST: 13 U/L (ref 10–35)
Albumin: 4.5 g/dL (ref 3.6–5.1)
Alkaline phosphatase (APISO): 63 U/L (ref 40–115)
BILIRUBIN TOTAL: 0.6 mg/dL (ref 0.2–1.2)
BUN: 15 mg/dL (ref 7–25)
CO2: 26 mmol/L (ref 20–32)
CREATININE: 1.11 mg/dL (ref 0.70–1.25)
Calcium: 9.8 mg/dL (ref 8.6–10.3)
Chloride: 106 mmol/L (ref 98–110)
GFR, EST AFRICAN AMERICAN: 81 mL/min/{1.73_m2} (ref >=60)
GFR, EST NON AFRICAN AMERICAN: 70 mL/min/{1.73_m2} (ref >=60)
Globulin: 2 g/dL (calc) (ref 1.9–3.7)
Glucose, Bld: 96 mg/dL (ref 65–99)
Potassium: 4.8 mmol/L (ref 3.5–5.3)
Sodium: 141 mmol/L (ref 135–146)
TOTAL PROTEIN: 6.5 g/dL (ref 6.1–8.1)

## 2017-10-24 LAB — VITAMIN B12: VITAMIN B 12: 1218 pg/mL — AB (ref 200–1100)

## 2017-10-24 LAB — TESTOSTERONE: Testosterone: 596 ng/dL (ref 250–827)

## 2017-10-24 LAB — TSH: TSH: 2.73 mIU/L (ref 0.40–4.50)

## 2017-10-24 LAB — HEMOGLOBIN A1C
Hgb A1c MFr Bld: 5.7 % of total Hgb — ABNORMAL HIGH (ref <5.7)
MEAN PLASMA GLUCOSE: 117 (calc)
eAG (mmol/L): 6.5 (calc)

## 2017-10-24 LAB — INSULIN, RANDOM: INSULIN: 7.1 u[IU]/mL (ref 2.0–19.6)

## 2017-10-24 LAB — MICROALBUMIN / CREATININE URINE RATIO
Creatinine, Urine: 157 mg/dL (ref 20–320)
MICROALB/CREAT RATIO: 2 ug/mg{creat} (ref <30)
Microalb, Ur: 0.3 mg/dL

## 2017-10-24 LAB — PSA: PSA: 1.1 ng/mL (ref <=4.0)

## 2017-10-24 LAB — LIPID PANEL
CHOL/HDL RATIO: 2.9 (calc) (ref <5.0)
CHOLESTEROL: 123 mg/dL (ref <200)
HDL: 43 mg/dL (ref >40)
LDL CHOLESTEROL (CALC): 60 mg/dL
NON-HDL CHOLESTEROL (CALC): 80 mg/dL (ref <130)
TRIGLYCERIDES: 110 mg/dL (ref <150)

## 2017-10-24 LAB — IRON, TOTAL/TOTAL IRON BINDING CAP
%SAT: 34 % (calc) (ref 20–48)
IRON: 131 ug/dL (ref 50–180)
TIBC: 380 mcg/dL (calc) (ref 250–425)

## 2017-10-24 LAB — VITAMIN D 25 HYDROXY (VIT D DEFICIENCY, FRACTURES): VIT D 25 HYDROXY: 89 ng/mL (ref 30–100)

## 2017-10-24 LAB — MAGNESIUM: Magnesium: 1.9 mg/dL (ref 1.5–2.5)

## 2017-12-05 ENCOUNTER — Other Ambulatory Visit: Payer: Self-pay | Admitting: Physician Assistant

## 2017-12-05 DIAGNOSIS — F419 Anxiety disorder, unspecified: Secondary | ICD-10-CM

## 2018-01-11 ENCOUNTER — Encounter: Payer: Self-pay | Admitting: Internal Medicine

## 2018-01-11 ENCOUNTER — Ambulatory Visit: Payer: 59 | Admitting: Internal Medicine

## 2018-01-11 VITALS — BP 116/68 | HR 80 | Temp 97.7°F | Resp 16 | Ht 71.0 in | Wt 197.0 lb

## 2018-01-11 DIAGNOSIS — R1031 Right lower quadrant pain: Secondary | ICD-10-CM

## 2018-01-11 NOTE — Progress Notes (Signed)
Subjective:    Patient ID: [name], male    DOB: [birth year], 62 y.o.   MRN: [id number]  HPI    This very nice 62 yo MWM presents with 1 week hx/o Rt groin pain which he describes as sharp & burning. Onset was gradual w/o noted incited event. No N/V. No change in BM's.  No UT sx's or change in Urine.       PMHx (+) for HTN (2021), PCA/Stent (2008), Negative Cardiolite (2015)                                                                                                                                                                                                                     Medication Sig  . ALPRAZolam  0.5 MG  TAKE 1/2-1 TAB 2-3 x DAILY ONLY IF NEEDED  . aspirin EC 81 MG  2 tabs po qd  . atorvastatin  80 MG  TAKE 1/2 TABLET  DAILY FOR CHOLESTEROL   . VIT D 5000 UNITS  Take 1 tablet  2 x daily.   Marland Kitchen VIT B12 tab Take 1 tablet  daily.  Marland Kitchen esomeprazole  20 MG  Take  daily   . finasteride  5 MG  TAKE 1 TABLET  EVERY DAY  . lisinopril 10 MG  TAKE 1 TABLET ONCE DAILY   . sildenafil  20 MG  TAKE 1 TO 5 TABS  DAILY AS NEEDED.   Allergies  Allergen Reactions  . Penicillins unspecified   Past Medical History:  Diagnosis Date  . ANXIETY 12/25/2007  . ANXIETY STATE NEC 11/01/2006  . CORONARY ARTERY DISEASE 08/02/2006   off Plavix x 3 years  . DIVERTICULITIS OF COLON 01/06/2010  . DVT, HX OF 08/02/2006  . 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 Hx of adenomatous polyp of colon 06/09/2010  . HYPERLIPIDEMIA 08/02/2006  . HYPERTENSION 08/05/2008  . Hypothyroidism 03/19/2014  . Memory loss 07/21/2009  . Pilonidal cyst    low back pain   Past Surgical History:  Procedure Laterality Date  . COLONOSCOPY    . COLONOSCOPY W/ POLYPECTOMY  06/16/2010   diminutive adenoma, diverticulosis, hemorrhoids  . CORONARY ANGIOPLASTY WITH STENT PLACEMENT    . ESOPHAGOGASTRODUODENOSCOPY  06/16/2010   antral ulcers, esophageal stricture - dilated, duodenitis, hiatal hernia  . PILONIDAL CYST EXCISION     teenager  . POLYPECTOMY    . UPPER GASTROINTESTINAL ENDOSCOPY      Review of Systems    10 point systems review  negative except as above.    Objective:   Physical Exam  BP 116/68   Pulse 80   Temp 97.7 F (36.5 C)   Resp 16   Ht 5\' 11"  (1.803 m)   Wt 197 lb (89.4 kg)   BMI 27.48 kg/m   HEENT - WNL. Neck - supple.  Chest - Clear equal BS. Cor - Nl HS. RRR w/o sig m. PP 1(+). No edema. Abd - Soft / benign. No masses or tenderness  & BS Nl.  GU -    Lt mid inguinal prominence ? Fatty  hernia. Lt cords/testes - Nl.   Rt inguinal external ring tenderness. Rt cords / testes Nl. MS- FROM w/o deformities.  Gait Nl. Neuro -  Nl w/o focal abnormalities.    Assessment & Plan:   1. Rt inguinal pain  - r/o incipient Inguinal hernia   PS: immediately after examination, patient c/o excruciating pain Rt groin and after sitting with ice pack to groin several minutes, pain subsided  - Appt scheduled to see Dr Ninfa Linden in the am. Patient advised if pain worsened to go to the ER.  Marland Kitchen

## 2018-01-11 NOTE — Patient Instructions (Addendum)

## 2018-01-12 ENCOUNTER — Other Ambulatory Visit: Payer: Self-pay | Admitting: Surgery

## 2018-02-05 NOTE — Progress Notes (Addendum)
FOLLOW UP  Assessment and Plan:    [name] was seen today for follow-up.  Diagnoses and all orders for this visit:  Essential hypertension - continue medications, DASH diet, exercise and monitor at home. Call if greater than 130/80.  -     CBC with Differential/Platelet -     COMPLETE METABOLIC PANEL WITH GFR  Prediabetes Discussed dietary and exercise modifications -     Hemoglobin A1c -     Insulin, random  Vitamin D deficiency At goal at recent check; continue to recommend supplementation for goal of 70-100 Defer vitamin D level  GERD  Doing well at this time Continue Nexium -     Magnesium   Hyperlipidemia, mixed Discussed dietary and exercise modifications -     Lipid panel - Rx sent:     atorvastatin (LIPITOR) 80 MG tablet; TAKE ONE TABLET BY MOUTH ONE TIME DAILY FOR CHOLESTEROL  Left inguinal hernia No change, evaluation with general surgery Scheduled in 6 days Continue to monitor  Rightt inguinal hernia No change, evaluation with general surgery Scheduled in 6 days Continue to monitor  Umbilical hernia w/o obstruction & w/o gangrene No change, evaluation with general surgery Scheduled in 6 days Continue to monitor  Chronic anxiety -     Discontinue: ALPRAZolam (XANAX) 0.5 MG tablet; Take one tablet by mouth daily as needed for anxiety. -     ALPRAZolam (XANAX) 1 MG tablet; Use half tablet as needed for anxiety or ar bedtime for sleep.  Benign prostatis hyperplasia without lower urinary tract symptoms  Continue to monitor. -     finasteride (PROSCAR) 5 MG tablet; Take half tablet daily   Call or return with new or worsening symptoms as discussed in appointment.  May contact via office phone [phone] or [name].  Continue diet and meds as discussed. Further disposition pending results of labs. Discussed med's effects and SE's.   Over 30 minutes of exam, counseling, chart review, and critical decision making was performed.   Future  Appointments  Date Time Provider [place]  05/08/2018 10:30 AM Unk Pinto, MD [facility] None  11/09/2018  9:00 AM Unk Pinto, MD [facility] None    ----------------------------------------------------------------------------------------------------------------------  HPI 62 y.o. male  presents for 3 month follow up on hypertension, cholesterol, diabetes, GERD and vitamin D deficiency.  He has history of BPH.LUTS follows with Dr Diona Fanti.   Reports he is going to have hernia repair in 6 days.  Umbilical right and left inguinal hernias.  He has had his preoperative appointments for this. Reports he has stopped taking his alieve related to his recent surgery as well as his ASA 81mg .                                         BMI is Body mass index is 27.53 kg/m., he has been working on diet and exercise by means of walking. Wt Readings from Last 3 Encounters:  02/06/18 197 lb 6.4 oz (89.5 kg)  01/11/18 197 lb (89.4 kg)  10/23/17 202 lb 9.6 oz (91.9 kg)   Patient has had HTN since 2010 and Stnet placement, PCA, in 2008. His blood pressure has been controlled at home, today their BP is BP: 126/78  He does workout. He denies chest pain, shortness of breath, dizziness.   He is on cholesterol medication Atorvastatin and denies myalgias. His cholesterol is at goal. The cholesterol last  visit was:   Lab Results  Component Value Date   CHOL 155 02/06/2018   HDL 48 02/06/2018   LDLCALC 84 02/06/2018   LDLDIRECT 190.6 07/30/2010   TRIG 125 02/06/2018   CHOLHDL 3.2 02/06/2018    He has been working on diet and exercise for prediabetes, and denies hyperglycemia, hypoglycemia , nausea, polydipsia, polyuria, visual disturbances and vomiting. Last A1C in the office was:  Lab Results  Component Value Date   HGBA1C 5.9 (H) 02/06/2018   Patient is on Vitamin D supplement.   Lab Results  Component Value Date   VD25OH 89 10/23/2017        Current Medications:  Current  Outpatient Medications on File Prior to Visit  Medication Sig  . aspirin EC 81 MG tablet 2 tabs po qd  . Cholecalciferol (VITAMIN D-3) 5000 UNITS TABS Take 1 tablet by mouth 2 (two) times daily.   . Cyanocobalamin (VITAMIN B 12 PO) Take 1 tablet by mouth daily.  Marland Kitchen esomeprazole (NEXIUM) 20 MG capsule Take 20 mg by mouth daily at 12 noon. Takes OTC 20 mg  . lisinopril (PRINIVIL,ZESTRIL) 10 MG tablet TAKE 1 TABLET BY MOUTH ONCE DAILY FOR BLOOD PRESSURE  . sildenafil (REVATIO) 20 MG tablet TAKE 1 TO 5 TABS BY MOUTH DAILY AS NEEDED.   No current facility-administered medications on file prior to visit.      Allergies:  Allergies  Allergen Reactions  . Penicillins     unspecified     Medical History:  Past Medical History:  Diagnosis Date  . ANXIETY 12/25/2007  . ANXIETY STATE NEC 11/01/2006  . CORONARY ARTERY DISEASE 08/02/2006   off Plavix x 3 years  . DIVERTICULITIS OF COLON 01/06/2010  . DVT, HX OF 08/02/2006  . 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 Hx of adenomatous polyp of colon 06/09/2010  . HYPERLIPIDEMIA 08/02/2006  . HYPERTENSION 08/05/2008  . Hypothyroidism 03/19/2014  . Memory loss 07/21/2009  . Pilonidal cyst    low back pain   Family history- Reviewed and unchanged Social history- Reviewed and unchanged   Review of Systems:  Review of Systems  Constitutional: Negative for chills, diaphoresis, fever, malaise/fatigue and weight loss.  HENT: Negative for congestion, ear discharge, ear pain, hearing loss, nosebleeds, sinus pain, sore throat and tinnitus.   Eyes: Negative for blurred vision, double vision, photophobia, pain, discharge and redness.  Respiratory: Negative for cough, hemoptysis, sputum production, shortness of breath, wheezing and stridor.   Cardiovascular: Negative for chest pain, palpitations, orthopnea, claudication, leg swelling and PND.  Gastrointestinal: Negative for abdominal pain, blood in stool, constipation, diarrhea, heartburn, melena, nausea and  vomiting.  Genitourinary: Negative for dysuria, flank pain, frequency, hematuria and urgency.  Musculoskeletal: Negative for back pain, falls, joint pain, myalgias and neck pain.       Hernias noted right, left and umbilical.  Skin: Negative for itching and rash.  Neurological: Negative for dizziness, tingling, tremors, sensory change, speech change, focal weakness, seizures, loss of consciousness, weakness and headaches.  Endo/Heme/Allergies: Negative for environmental allergies and polydipsia. Does not bruise/bleed easily.  Psychiatric/Behavioral: Negative for depression, hallucinations, memory loss, substance abuse and suicidal ideas. The patient is nervous/anxious. The patient does not have insomnia.       Physical Exam: BP 126/78   Pulse 76   Temp (!) 97.5 F (36.4 C)   Resp 16   Ht 5\' 11"  (1.803 m)   Wt 197 lb 6.4 oz (89.5 kg)   BMI 27.53 kg/m  Wt Readings  from Last 3 Encounters:  02/06/18 197 lb 6.4 oz (89.5 kg)  01/11/18 197 lb (89.4 kg)  10/23/17 202 lb 9.6 oz (91.9 kg)   General Appearance: Well nourished, in no apparent distress. Eyes: PERRLA, EOMs, conjunctiva no swelling or erythema Sinuses: No Frontal/maxillary tenderness ENT/Mouth: Ext aud canals clear, TMs without erythema, bulging. No erythema, swelling, or exudate on post pharynx.  Tonsils not swollen or erythematous. Hearing normal.  Neck: Supple, thyroid normal.  Respiratory: Respiratory effort normal, BS equal bilaterally without rales, rhonchi, wheezing or stridor.  Cardio: RRR with no MRGs. Brisk peripheral pulses without edema.  Abdomen: Soft, + BS.  Non tender, no guarding, rebound, masses. Bilateral hernias and umbilical. Lymphatics: Non tender without lymphadenopathy.  Musculoskeletal: Full ROM, 5/5 strength, Normal gait Skin: Warm, dry without rashes, lesions, ecchymosis.  Neuro: Cranial nerves intact. No cerebellar symptoms.  Psych: Awake and oriented X 3, normal affect, Insight and Judgment  appropriate.    Garnet Sierras, NP 9:00 AM [facility]

## 2018-02-06 ENCOUNTER — Ambulatory Visit: Payer: 59 | Admitting: Adult Health Nurse Practitioner

## 2018-02-06 ENCOUNTER — Ambulatory Visit: Payer: Self-pay | Admitting: Physician Assistant

## 2018-02-06 VITALS — BP 126/78 | HR 76 | Temp 97.5°F | Resp 16 | Ht 71.0 in | Wt 197.4 lb

## 2018-02-06 DIAGNOSIS — R7303 Prediabetes: Secondary | ICD-10-CM

## 2018-02-06 DIAGNOSIS — K219 Gastro-esophageal reflux disease without esophagitis: Secondary | ICD-10-CM

## 2018-02-06 DIAGNOSIS — R7309 Other abnormal glucose: Secondary | ICD-10-CM | POA: Diagnosis not present

## 2018-02-06 DIAGNOSIS — I10 Essential (primary) hypertension: Secondary | ICD-10-CM

## 2018-02-06 DIAGNOSIS — K429 Umbilical hernia without obstruction or gangrene: Secondary | ICD-10-CM

## 2018-02-06 DIAGNOSIS — Z79899 Other long term (current) drug therapy: Secondary | ICD-10-CM | POA: Diagnosis not present

## 2018-02-06 DIAGNOSIS — N400 Enlarged prostate without lower urinary tract symptoms: Secondary | ICD-10-CM

## 2018-02-06 DIAGNOSIS — F419 Anxiety disorder, unspecified: Secondary | ICD-10-CM

## 2018-02-06 DIAGNOSIS — K4090 Unilateral inguinal hernia, without obstruction or gangrene, not specified as recurrent: Secondary | ICD-10-CM

## 2018-02-06 DIAGNOSIS — E782 Mixed hyperlipidemia: Secondary | ICD-10-CM

## 2018-02-06 DIAGNOSIS — E559 Vitamin D deficiency, unspecified: Secondary | ICD-10-CM

## 2018-02-06 MED ORDER — ALPRAZOLAM 1 MG PO TABS: ORAL_TABLET | ORAL | 0 refills | Status: DC

## 2018-02-06 MED ORDER — FINASTERIDE 5 MG PO TABS: ORAL_TABLET | ORAL | 11 refills | Status: DC

## 2018-02-06 MED ORDER — ALPRAZOLAM 0.5 MG PO TABS: ORAL_TABLET | ORAL | 0 refills | Status: DC

## 2018-02-06 MED ORDER — ATORVASTATIN CALCIUM 80 MG PO TABS: ORAL_TABLET | ORAL | 1 refills | Status: DC

## 2018-02-06 NOTE — Patient Instructions (Signed)
We have sent in a refill for your Alprazolam today.  Take half to one tablet daily as needed for anxiety or at bedtime for sleep.  Continue to hold Aspirin 81mg  and any NSAID's (Ibuprofen, Aleive)  We will contact you in 1-2 days with lab results.  You are due for Influenza vaccination, follow up with [facility] for this.   Follow up in 64months  Contact office with any new or worsening symptoms / concerns.   Preventive Care for Adults  A healthy lifestyle and preventive care can promote health and wellness. Preventive health guidelines for men include the following key practices:  A routine yearly physical is a good way to check with your health care provider about your health and preventative screening. It is a chance to share any concerns and updates on your health and to receive a thorough exam.  Visit your dentist for a routine exam and preventative care every 6 months. Brush your teeth twice a day and floss once a day. Good oral hygiene prevents tooth decay and gum disease.  The frequency of eye exams is based on your age, health, family medical history, use of contact lenses, and other factors. Follow your health care provider's recommendations for frequency of eye exams.  Eat a healthy diet. Foods such as vegetables, fruits, whole grains, low-fat dairy products, and lean protein foods contain the nutrients you need without too many calories. Decrease your intake of foods high in solid fats, added sugars, and salt. Eat the right amount of calories for you. Get information about a proper diet from your health care provider, if necessary.  Regular physical exercise is one of the most important things you can do for your health. Most adults should get at least 150 minutes of moderate-intensity exercise (any activity that increases your heart rate and causes you to sweat) each week. In addition, most adults need muscle-strengthening exercises on 2 or more days a week.  Maintain a healthy  weight. The body mass index (BMI) is a screening tool to identify possible weight problems. It provides an estimate of body fat based on height and weight. Your health care provider can find your BMI and can help you achieve or maintain a healthy weight. For adults 20 years and older:  A BMI below 18.5 is considered underweight.  A BMI of 18.5 to 24.9 is normal.  A BMI of 25 to 29.9 is considered overweight.  A BMI of 30 and above is considered obese.  Maintain normal blood lipids and cholesterol levels by exercising and minimizing your intake of saturated fat. Eat a balanced diet with plenty of fruit and vegetables. Blood tests for lipids and cholesterol should begin at age 35 and be repeated every 5 years. If your lipid or cholesterol levels are high, you are over 50, or you are at high risk for heart disease, you may need your cholesterol levels checked more frequently. Ongoing high lipid and cholesterol levels should be treated with medicines if diet and exercise are not working.  If you smoke, find out from your health care provider how to quit. If you do not use tobacco, do not start.  Lung cancer screening is recommended for adults aged 62-80 years who are at high risk for developing lung cancer because of a history of smoking. A yearly low-dose CT scan of the lungs is recommended for people who have at least a 30-pack-year history of smoking and are a current smoker or have quit within the past 15 years.  A pack year of smoking is smoking an average of 1 pack of cigarettes a day for 1 year (for example: 1 pack a day for 30 years or 2 packs a day for 15 years). Yearly screening should continue until the smoker has stopped smoking for at least 15 years. Yearly screening should be stopped for people who develop a health problem that would prevent them from having lung cancer treatment.  If you choose to drink alcohol, do not have more than 2 drinks per day. One drink is considered to be 12 ounces  (355 mL) of beer, 5 ounces (148 mL) of wine, or 1.5 ounces (44 mL) of liquor.  Avoid use of street drugs. Do not share needles with anyone. Ask for help if you need support or instructions about stopping the use of drugs.  High blood pressure causes heart disease and increases the risk of stroke. Your blood pressure should be checked at least every 1-2 years. Ongoing high blood pressure should be treated with medicines, if weight loss and exercise are not effective.  If you are 44-60 years old, ask your health care provider if you should take aspirin to prevent heart disease.  Diabetes screening involves taking a blood sample to check your fasting blood sugar level. This should be done once every 3 years, after age 109, if you are within normal weight and without risk factors for diabetes. Testing should be considered at a younger age or be carried out more frequently if you are overweight and have at least 1 risk factor for diabetes.  Colorectal cancer can be detected and often prevented. Most routine colorectal cancer screening begins at the age of 9 and continues through age 9. However, your health care provider may recommend screening at an earlier age if you have risk factors for colon cancer. On a yearly basis, your health care provider may provide home test kits to check for hidden blood in the stool. Use of a small camera at the end of a tube to directly examine the colon (sigmoidoscopy or colonoscopy) can detect the earliest forms of colorectal cancer. Talk to your health care provider about this at age 32, when routine screening begins. Direct exam of the colon should be repeated every 5-10 years through age 34, unless early forms of precancerous polyps or small growths are found.   Talk with your health care provider about prostate cancer screening.  Testicular cancer screening isrecommended for adult males. Screening includes self-exam, a health care provider exam, and other screening  tests. Consult with your health care provider about any symptoms you have or any concerns you have about testicular cancer.  Use sunscreen. Apply sunscreen liberally and repeatedly throughout the day. You should seek shade when your shadow is shorter than you. Protect yourself by wearing long sleeves, pants, a wide-brimmed hat, and sunglasses year round, whenever you are outdoors.  Once a month, do a whole-body skin exam, using a mirror to look at the skin on your back. Tell your health care provider about new moles, moles that have irregular borders, moles that are larger than a pencil eraser, or moles that have changed in shape or color.  Stay current with required vaccines (immunizations).  Influenza vaccine. All adults should be immunized every year.  Tetanus, diphtheria, and acellular pertussis (Td, Tdap) vaccine. An adult who has not previously received Tdap or who does not know his vaccine status should receive 1 dose of Tdap. This initial dose should be followed by tetanus and  diphtheria toxoids (Td) booster doses every 10 years. Adults with an unknown or incomplete history of completing a 3-dose immunization series with Td-containing vaccines should begin or complete a primary immunization series including a Tdap dose. Adults should receive a Td booster every 10 years.  Varicella vaccine. An adult without evidence of immunity to varicella should receive 2 doses or a second dose if he has previously received 1 dose.  Human papillomavirus (HPV) vaccine. Males aged 65-21 years who have not received the vaccine previously should receive the 3-dose series. Males aged 22-26 years may be immunized. Immunization is recommended through the age of 22 years for any male who has sex with males and did not get any or all doses earlier. Immunization is recommended for any person with an immunocompromised condition through the age of 9 years if he did not get any or all doses earlier. During the 3-dose  series, the second dose should be obtained 4-8 weeks after the first dose. The third dose should be obtained 24 weeks after the first dose and 16 weeks after the second dose.  Zoster vaccine. One dose is recommended for adults aged 81 years or older unless certain conditions are present.    PREVNAR  - Pneumococcal 13-valent conjugate (PCV13) vaccine. When indicated, a person who is uncertain of his immunization history and has no record of immunization should receive the PCV13 vaccine. An adult aged 11 years or older who has certain medical conditions and has not been previously immunized should receive 1 dose of PCV13 vaccine. This PCV13 should be followed with a dose of pneumococcal polysaccharide (PPSV23) vaccine. The PPSV23 vaccine dose should be obtained at least 1 r more year(s) after the dose of PCV13 vaccine. An adult aged 43 years or older who has certain medical conditions and previously received 1 or more doses of PPSV23 vaccine should receive 1 dose of PCV13. The PCV13 vaccine dose should be obtained 1 or more years after the last PPSV23 vaccine dose.    PNEUMOVAX - Pneumococcal polysaccharide (PPSV23) vaccine. When PCV13 is also indicated, PCV13 should be obtained first. All adults aged 65 years and older should be immunized. An adult younger than age 31 years who has certain medical conditions should be immunized. Any person who resides in a nursing home or long-term care facility should be immunized. An adult smoker should be immunized. People with an immunocompromised condition and certain other conditions should receive both PCV13 and PPSV23 vaccines. People with human immunodeficiency virus (HIV) infection should be immunized as soon as possible after diagnosis. Immunization during chemotherapy or radiation therapy should be avoided. Routine use of PPSV23 vaccine is not recommended for American Indians, [place]atives, or people younger than 65 years unless there are medical conditions  that require PPSV23 vaccine. When indicated, people who have unknown immunization and have no record of immunization should receive PPSV23 vaccine. One-time revaccination 5 years after the first dose of PPSV23 is recommended for people aged 19-64 years who have chronic kidney failure, nephrotic syndrome, asplenia, or immunocompromised conditions. People who received 1-2 doses of PPSV23 before age 47 years should receive another dose of PPSV23 vaccine at age 46 years or later if at least 5 years have passed since the previous dose. Doses of PPSV23 are not needed for people immunized with PPSV23 at or after age 48 years.    Hepatitis A vaccine. Adults who wish to be protected from this disease, have certain high-risk conditions, work with hepatitis A-infected animals, work in hepatitis  A research labs, or travel to or work in countries with a high rate of hepatitis A should be immunized. Adults who were previously unvaccinated and who anticipate close contact with an international adoptee during the first 60 days after arrival in the [place] from a country with a high rate of hepatitis A should be immunized.    Hepatitis B vaccine. Adults should be immunized if they wish to be protected from this disease, have certain high-risk conditions, may be exposed to blood or other infectious body fluids, are household contacts or sex partners of hepatitis B positive people, are clients or workers in certain care facilities, or travel to or work in countries with a high rate of hepatitis B.   Preventive Service / Frequency   Ages 21 to 52  Blood pressure check.  Lipid and cholesterol check  Lung cancer screening. / Every year if you are aged 47-80 years and have a 30-pack-year history of smoking and currently smoke or have quit within the past 15 years. Yearly screening is stopped once you have quit smoking for at least 15 years or develop a health problem that would prevent you from having lung cancer  treatment.  Fecal occult blood test (FOBT) of stool. / Every year beginning at age 32 and continuing until age 21. You may not have to do this test if you get a colonoscopy every 10 years.  Flexible sigmoidoscopy** or colonoscopy.** / Every 5 years for a flexible sigmoidoscopy or every 10 years for a colonoscopy beginning at age 61 and continuing until age 80. Screening for abdominal aortic aneurysm (AAA)  by ultrasound is recommended for people who have history of high blood pressure or who are current or former smokers. +++++++++++ Recommend Adult Low Dose Aspirin or  coated  Aspirin 81 mg daily  To reduce risk of Colon Cancer 20 %,  Skin Cancer 26 % ,  Malignant Melanoma 46%  and  Pancreatic cancer 60% ++++++++++++++++++++ Vitamin D goal  is between 70-100.  Please make sure that you are taking your Vitamin D as directed.  It is very important as a natural anti-inflammatory  helping hair, skin, and nails, as well as reducing stroke and heart attack risk.  It helps your bones and helps with mood. It also decreases numerous cancer risks so please take it as directed.  Low Vit D is associated with a 200-300% higher risk for CANCER  and 200-300% higher risk for HEART   ATTACK  &  STROKE.   .....................................Marland Kitchen It is also associated with higher death rate at younger ages,  autoimmune diseases like Rheumatoid arthritis, Lupus, Multiple Sclerosis.    Also many other serious conditions, like depression, Alzheimer's Dementia, infertility, muscle aches, fatigue, fibromyalgia - just to name a few. +++++++++++++++++++++ Recommend the book "The END of DIETING" by Dr Excell Seltzer  & the book "The END of DIABETES " by Dr Excell Seltzer At [email or web address] - get book & Audio CD's    Being diabetic has a  300% increased risk for heart attack, stroke, cancer, and alzheimer- type vascular dementia. It is very important that you work harder with diet by avoiding all foods that are white.  Avoid white rice (brown & wild rice is OK), white potatoes (sweetpotatoes in moderation is OK), White bread or wheat bread or anything made out of white flour like bagels, donuts, rolls, buns, biscuits, cakes, pastries, cookies, pizza crust, and pasta (made from white flour & egg whites) - vegetarian pasta or spinach  or wheat pasta is OK. Multigrain breads like Arnold's or [employer], or multigrain sandwich thins or flatbreads.  Diet, exercise and weight loss can reverse and cure diabetes in the early stages.  Diet, exercise and weight loss is very important in the control and prevention of complications of diabetes which affects every system in your body, ie. Brain - dementia/stroke, eyes - glaucoma/blindness, heart - heart attack/heart failure, kidneys - dialysis, stomach - gastric paralysis, intestines - malabsorption, nerves - severe painful neuritis, circulation - gangrene & loss of a leg(s), and finally cancer and Alzheimers.    I recommend avoid fried & greasy foods,  sweets/candy, white rice (brown or wild rice or Quinoa is OK), white potatoes (sweet potatoes are OK) - anything made from white flour - bagels, doughnuts, rolls, buns, biscuits,white and wheat breads, pizza crust and traditional pasta made of white flour & egg white(vegetarian pasta or spinach or wheat pasta is OK).  Multi-grain bread is OK - like multi-grain flat bread or sandwich thins. Avoid alcohol in excess. Exercise is also important.    Eat all the vegetables you want - avoid meat, especially red meat and dairy - especially cheese.  Cheese is the most concentrated form of trans-fats which is the worst thing to clog up our arteries. Veggie cheese is OK which can be found in the fresh produce section at [employer] or [employer] or [employer]  ++++++++++++++++++++++ DASH Eating Plan  DASH stands for "Dietary Approaches to Stop Hypertension."   The DASH eating plan is a healthy eating plan that has been shown to reduce  high blood pressure (hypertension). Additional health benefits may include reducing the risk of type 2 diabetes mellitus, heart disease, and stroke. The DASH eating plan may also help with weight loss. WHAT DO I NEED TO KNOW ABOUT THE DASH EATING PLAN? For the DASH eating plan, you will follow these general guidelines:  Choose foods with a percent daily value for sodium of less than 5% (as listed on the food label).  Use salt-free seasonings or herbs instead of table salt or sea salt.  Check with your health care provider or pharmacist before using salt substitutes.  Eat lower-sodium products, often labeled as "lower sodium" or "no salt added."  Eat fresh foods.  Eat more vegetables, fruits, and low-fat dairy products.  Choose whole grains. Look for the word "whole" as the first word in the ingredient list.  Choose fish   Limit sweets, desserts, sugars, and sugary drinks.  Choose heart-healthy fats.  Eat veggie cheese   Eat more home-cooked food and less restaurant, buffet, and fast food.  Limit fried foods.  Cook foods using methods other than frying.  Limit canned vegetables. If you do use them, rinse them well to decrease the sodium.  When eating at a restaurant, ask that your food be prepared with less salt, or no salt if possible.                      WHAT FOODS CAN I EAT? Read Dr Fara Olden Fuhrman's books on The End of Dieting & The End of Diabetes  Grains Whole grain or whole wheat bread. Brown rice. Whole grain or whole wheat pasta. Quinoa, bulgur, and whole grain cereals. Low-sodium cereals. Corn or whole wheat flour tortillas. Whole grain cornbread. Whole grain crackers. Low-sodium crackers.  Vegetables Fresh or frozen vegetables (raw, steamed, roasted, or grilled). Low-sodium or reduced-sodium tomato and vegetable juices. Low-sodium or reduced-sodium tomato sauce and paste.  Low-sodium or reduced-sodium canned vegetables.   Fruits All fresh, canned (in natural  juice), or frozen fruits.  Protein Products  All fish and seafood.  Dried beans, peas, or lentils. Unsalted nuts and seeds. Unsalted canned beans.  Dairy Low-fat dairy products, such as skim or 1% milk, 2% or reduced-fat cheeses, low-fat ricotta or cottage cheese, or plain low-fat yogurt. Low-sodium or reduced-sodium cheeses.  Fats and Oils Tub margarines without trans fats. Light or reduced-fat mayonnaise and salad dressings (reduced sodium). Avocado. Safflower, olive, or canola oils. Natural peanut or almond butter.  Other Unsalted popcorn and pretzels. The items listed above may not be a complete list of recommended foods or beverages. Contact your dietitian for more options.  +++++++++++++++++++  WHAT FOODS ARE NOT RECOMMENDED? Grains/ White flour or wheat flour White bread. White pasta. White rice. Refined cornbread. Bagels and croissants. Crackers that contain trans fat.  Vegetables  Creamed or fried vegetables. Vegetables in a . Regular canned vegetables. Regular canned tomato sauce and paste. Regular tomato and vegetable juices.  Fruits Dried fruits. Canned fruit in light or heavy syrup. Fruit juice.  Meat and Other Protein Products Meat in general - RED meat & White meat.  Fatty cuts of meat. Ribs, chicken wings, all processed meats as bacon, sausage, bologna, salami, fatback, hot dogs, bratwurst and packaged luncheon meats.  Dairy Whole or 2% milk, cream, half-and-half, and cream cheese. Whole-fat or sweetened yogurt. Full-fat cheeses or blue cheese. Non-dairy creamers and whipped toppings. Processed cheese, cheese spreads, or cheese curds.  Condiments Onion and garlic salt, seasoned salt, table salt, and sea salt. Canned and packaged gravies. Worcestershire sauce. Tartar sauce. Barbecue sauce. Teriyaki sauce. Soy sauce, including reduced sodium. Steak sauce. Fish sauce. Oyster sauce. Cocktail sauce. Horseradish. Ketchup and mustard. Meat flavorings and tenderizers.  Bouillon cubes. Hot sauce. Tabasco sauce. Marinades. Taco seasonings. Relishes.  Fats and Oils Butter, stick margarine, lard, shortening and bacon fat. Coconut, palm kernel, or palm oils. Regular salad dressings.  Pickles and olives. Salted popcorn and pretzels.  The items listed above may not be a complete list of foods and beverages to avoid.

## 2018-02-08 LAB — COMPLETE METABOLIC PANEL WITHOUT GFR
BUN: 23 mg/dL (ref 7–25)
CO2: 27 mmol/L (ref 20–32)
Creat: 1.23 mg/dL (ref 0.70–1.25)
GFR, Est Non African American: 62 mL/min/1.73m2 (ref >=60)
Glucose, Bld: 111 mg/dL — ABNORMAL HIGH (ref 65–99)

## 2018-02-08 LAB — LIPID PANEL
Cholesterol: 155 mg/dL (ref <200)
HDL: 48 mg/dL (ref >40)
LDL Cholesterol (Calc): 84 mg/dL
Non-HDL Cholesterol (Calc): 107 mg/dL (calc) (ref <130)
Total CHOL/HDL Ratio: 3.2 (calc) (ref <5.0)
Triglycerides: 125 mg/dL (ref <150)

## 2018-02-08 LAB — CBC WITH DIFFERENTIAL/PLATELET
Absolute Monocytes: 697 cells/uL (ref 200–950)
Basophils Absolute: 51 cells/uL (ref 0–200)
Basophils Relative: 0.6 %
Eosinophils Absolute: 213 cells/uL (ref 15–500)
Eosinophils Relative: 2.5 %
HCT: 41.6 % (ref 38.5–50.0)
Hemoglobin: 14.5 g/dL (ref 13.2–17.1)
Lymphs Abs: 1386 cells/uL (ref 850–3900)
MCH: 31.2 pg (ref 27.0–33.0)
MCHC: 34.9 g/dL (ref 32.0–36.0)
MCV: 89.5 fL (ref 80.0–100.0)
MPV: 11.6 fL (ref 7.5–12.5)
Monocytes Relative: 8.2 %
Neutro Abs: 6154 cells/uL (ref 1500–7800)
Neutrophils Relative %: 72.4 %
Platelets: 298 10*3/uL (ref 140–400)
RBC: 4.65 10*6/uL (ref 4.20–5.80)
RDW: 13.5 % (ref 11.0–15.0)
Total Lymphocyte: 16.3 %
WBC: 8.5 Thousand/uL (ref 3.8–10.8)

## 2018-02-08 LAB — MAGNESIUM: Magnesium: 2 mg/dL (ref 1.5–2.5)

## 2018-02-08 LAB — COMPLETE METABOLIC PANEL WITH GFR
AG Ratio: 2 (calc) (ref 1.0–2.5)
ALT: 18 U/L (ref 9–46)
AST: 12 U/L (ref 10–35)
Albumin: 4.5 g/dL (ref 3.6–5.1)
Alkaline phosphatase (APISO): 67 U/L (ref 40–115)
Calcium: 9.8 mg/dL (ref 8.6–10.3)
Chloride: 106 mmol/L (ref 98–110)
GFR, Est African American: 71 mL/min/{1.73_m2} (ref >=60)
Globulin: 2.2 g/dL (calc) (ref 1.9–3.7)
Potassium: 4.9 mmol/L (ref 3.5–5.3)
Sodium: 140 mmol/L (ref 135–146)
Total Bilirubin: 0.4 mg/dL (ref 0.2–1.2)
Total Protein: 6.7 g/dL (ref 6.1–8.1)

## 2018-02-08 LAB — HEMOGLOBIN A1C
Hgb A1c MFr Bld: 5.9 % of total Hgb — ABNORMAL HIGH (ref <5.7)
Mean Plasma Glucose: 123 (calc)
eAG (mmol/L): 6.8 (calc)

## 2018-02-08 LAB — INSULIN, RANDOM: Insulin: 4.2 u[IU]/mL (ref 2.0–19.6)

## 2018-02-09 ENCOUNTER — Encounter: Payer: Self-pay | Admitting: Adult Health Nurse Practitioner

## 2018-02-09 DIAGNOSIS — R1032 Left lower quadrant pain: Secondary | ICD-10-CM | POA: Insufficient documentation

## 2018-02-09 DIAGNOSIS — R1031 Right lower quadrant pain: Secondary | ICD-10-CM | POA: Insufficient documentation

## 2018-02-22 ENCOUNTER — Other Ambulatory Visit: Payer: Self-pay | Admitting: Internal Medicine

## 2018-02-22 DIAGNOSIS — E782 Mixed hyperlipidemia: Secondary | ICD-10-CM

## 2018-02-23 IMAGING — CR DG SACRUM/COCCYX 2+V
3 series · 3 of 3 positions shown · non-contrast
Comparison: Sacrum and coccyx radiographs November 2003

CLINICAL DATA: Coccydynia.  Pain for several weeks.

EXAM:
SACRUM AND COCCYX - 2+ VIEW

[coccyx ap]
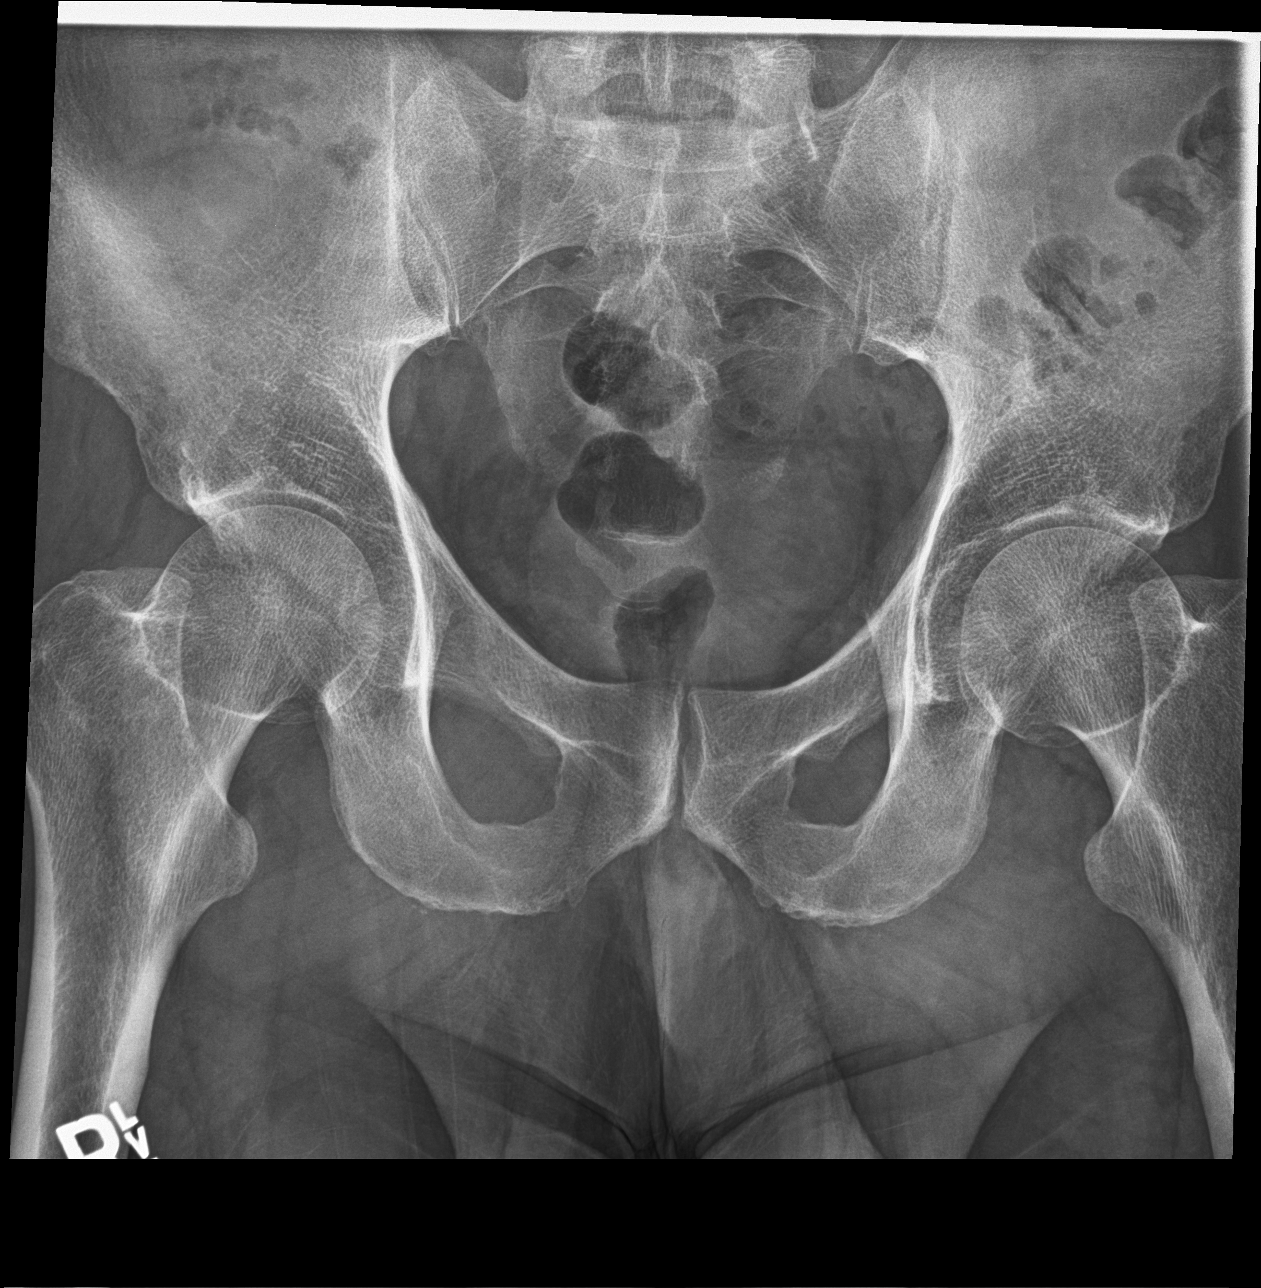

[sacrum ap]
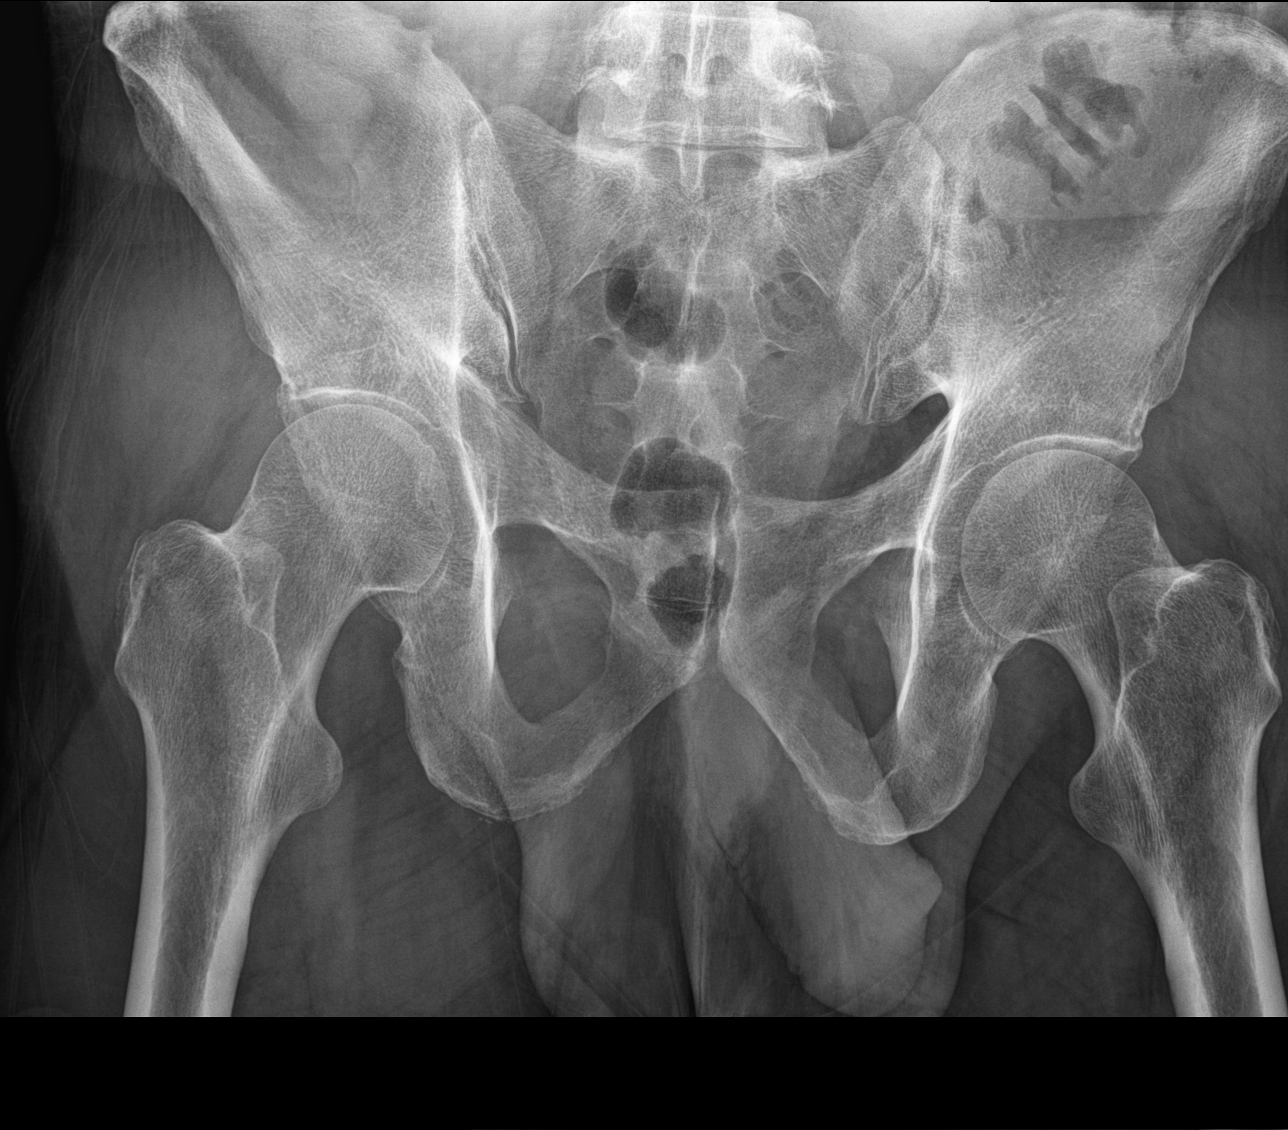

[sacrum lat]
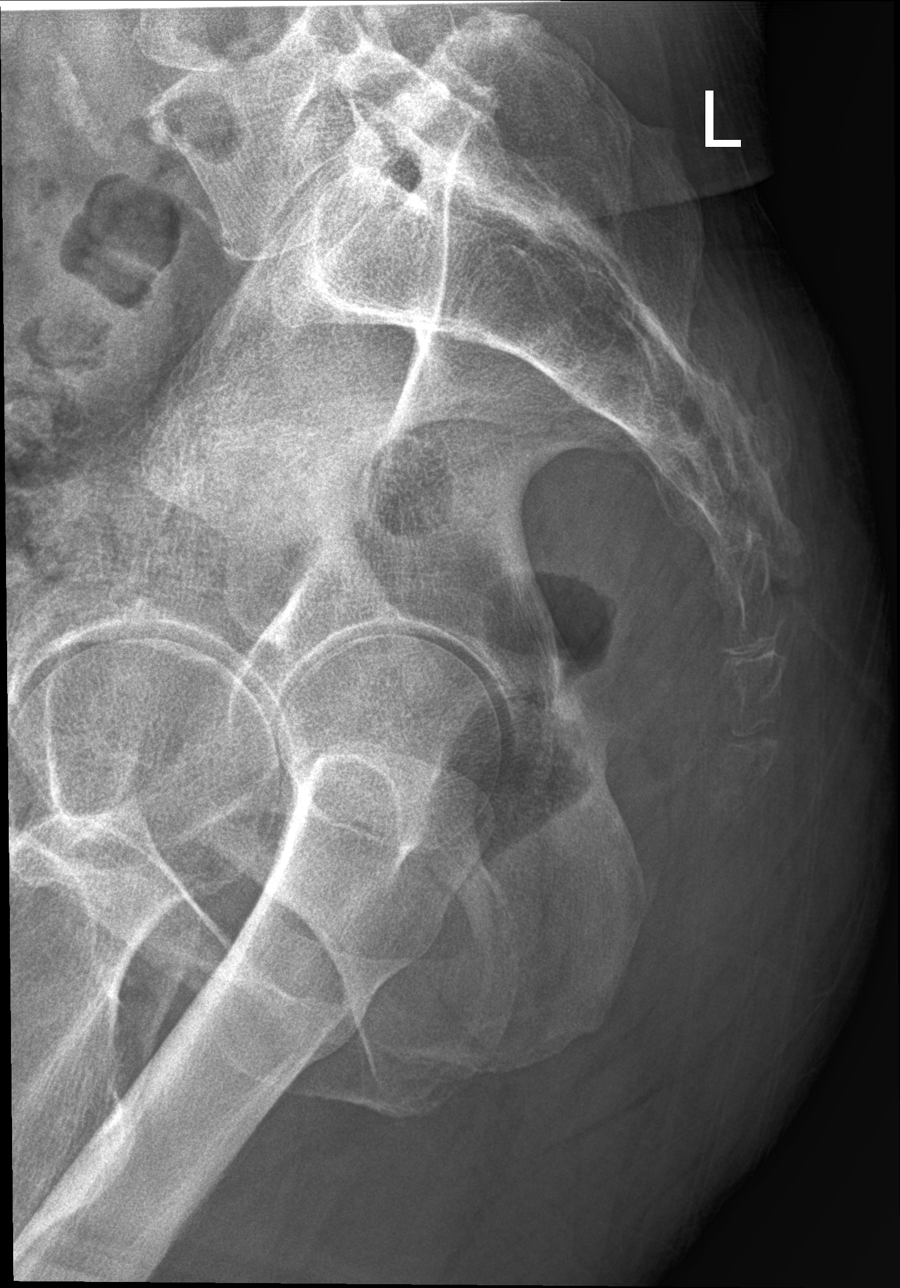

[3 of 3 positions shown; findings below may reference images not displayed]

FINDINGS: There is no evidence of fracture or other focal bone lesions.
Fragmentation of the distal coccygeal segment is unchanged from
prior exam. Sacroiliac joints are congruent.
IMPRESSION: Negative radiographs of the sacrum and coccyx.

## 2018-04-02 ENCOUNTER — Other Ambulatory Visit: Payer: Self-pay | Admitting: Internal Medicine

## 2018-05-07 ENCOUNTER — Encounter: Payer: Self-pay | Admitting: Internal Medicine

## 2018-05-07 NOTE — Patient Instructions (Signed)

## 2018-05-07 NOTE — Progress Notes (Signed)
This very nice 63 y.o. MWM presents for 6 month follow up with HTN, ASCAD /Stents, HLD, Pre-Diabetes and Vitamin D Deficiency. Patient ids followed by Dr Diona Fanti for BPH/LUTS.      Patient is treated for HTN (2010) & BP has been controlled at home. Today's BP is at goal - 124/82. He has hx/o ACS/Stenting (2008) & is followed by Dr Dorris Carnes. In 2015, he had a negative Cardiolite.  Patient has had no complaints of any cardiac type chest pain, palpitations, dyspnea / orthopnea / PND, dizziness, claudication, or dependent edema.     Hyperlipidemia is controlled with diet & meds. Patient denies myalgias or other med SE's. Last Lipids were at goal: Lab Results  Component Value Date   CHOL 155 02/06/2018   HDL 48 02/06/2018   LDLCALC 84 02/06/2018   LDLDIRECT 190.6 07/30/2010   TRIG 125 02/06/2018   CHOLHDL 3.2 02/06/2018      Also, the patient has history of PreDiabetes  (A1c6.1% / 2014  &6.3% / 2015)  and has had no symptoms of reactive hypoglycemia, diabetic polys, paresthesias or visual blurring.  Last A1c was not at goal:  Lab Results  Component Value Date   HGBA1C 5.9 (H) 02/06/2018      Further, the patient also has history of Vitamin D Deficiency ("43" / 2014 and "30" / 2016)   and supplements vitamin D without any suspected side-effects. Last vitamin D was at goal (70-100): Lab Results  Component Value Date   VD25OH 89 10/23/2017   Current Outpatient Medications on File Prior to Visit  Medication Sig  . ALPRAZolam (XANAX) 1 MG tablet Use half tablet as needed for anxiety or ar bedtime for sleep.  Marland Kitchen aspirin EC 81 MG tablet 2 tabs po qd  . atorvastatin (LIPITOR) 80 MG tablet TAKE ONE TABLET BY MOUTH ONE TIME DAILY FOR CHOLESTEROL  . Cholecalciferol (VITAMIN D-3) 5000 UNITS TABS Take 1 tablet by mouth 2 (two) times daily.   . Cyanocobalamin (VITAMIN B 12 PO) Take 1 tablet by mouth daily.  Marland Kitchen esomeprazole (NEXIUM) 20 MG capsule Take 20 mg by mouth daily at 12 noon. Takes OTC  20 mg  . finasteride (PROSCAR) 5 MG tablet Take half tablet daily  . lisinopril (PRINIVIL,ZESTRIL) 10 MG tablet TAKE 1 TABLET BY MOUTH ONCE DAILY FOR BLOOD PRESSURE  . sildenafil (REVATIO) 20 MG tablet TAKE 1 TO 5 TABS BY MOUTH DAILY AS NEEDED.   No current facility-administered medications on file prior to visit.    Allergies  Allergen Reactions  . Penicillins     unspecified   PMHx:   Past Medical History:  Diagnosis Date  . ANXIETY 12/25/2007  . ANXIETY STATE NEC 11/01/2006  . CORONARY ARTERY DISEASE 08/02/2006   off Plavix x 3 years  . DIVERTICULITIS OF COLON 01/06/2010  . DVT, HX OF 08/02/2006  . 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 Hx of adenomatous polyp of colon 06/09/2010  . HYPERLIPIDEMIA 08/02/2006  . HYPERTENSION 08/05/2008  . Hypothyroidism 03/19/2014  . Memory loss 07/21/2009  . Pilonidal cyst    low back pain   Immunization History  Administered Date(s) Administered  . PPD Test 07/24/2013, 07/30/2014, 09/16/2016, 10/23/2017  . Tdap 07/30/2010   Past Surgical History:  Procedure Laterality Date  . COLONOSCOPY    . COLONOSCOPY W/ POLYPECTOMY  06/16/2010   diminutive adenoma, diverticulosis, hemorrhoids  . CORONARY ANGIOPLASTY WITH STENT PLACEMENT    . ESOPHAGOGASTRODUODENOSCOPY  06/16/2010   antral  ulcers, esophageal stricture - dilated, duodenitis, hiatal hernia  . PILONIDAL CYST EXCISION     teenager  . POLYPECTOMY    . UPPER GASTROINTESTINAL ENDOSCOPY     FHx:    Reviewed / unchanged  SHx:    Reviewed / unchanged   Systems Review:  Constitutional: Denies fever, chills, wt changes, headaches, insomnia, fatigue, night sweats, change in appetite. Eyes: Denies redness, blurred vision, diplopia, discharge, itchy, watery eyes.  ENT: Denies discharge, congestion, post nasal drip, epistaxis, sore throat, earache, hearing loss, dental pain, tinnitus, vertigo, sinus pain, snoring.  CV: Denies chest pain, palpitations, irregular heartbeat, syncope, dyspnea, diaphoresis,  orthopnea, PND, claudication or edema. Respiratory: denies cough, dyspnea, DOE, pleurisy, hoarseness, laryngitis, wheezing.  Gastrointestinal: Denies dysphagia, odynophagia, heartburn, reflux, water brash, abdominal pain or cramps, nausea, vomiting, bloating, diarrhea, constipation, hematemesis, melena, hematochezia  or hemorrhoids. Genitourinary: Denies dysuria, frequency, urgency, nocturia, hesitancy, discharge, hematuria or flank pain. Musculoskeletal: Denies arthralgias, myalgias, stiffness, jt. swelling, pain, limping or strain/sprain.  Skin: Denies pruritus, rash, hives, warts, acne, eczema or change in skin lesion(s). Neuro: No weakness, tremor, incoordination, spasms, paresthesia or pain. Psychiatric: Denies confusion, memory loss or sensory loss. Endo: Denies change in weight, skin or hair change.  Heme/Lymph: No excessive bleeding, bruising or enlarged lymph nodes.  Physical Exam  BP 124/82   Pulse 72   Temp (!) 96.9 F (36.1 C)   Resp 16   Ht 5\' 11"  (1.803 m)   Wt 202 lb 9.6 oz (91.9 kg)   BMI 28.26 kg/m   Appears  well nourished, well groomed  and in no distress.  Eyes: PERRLA, EOMs, conjunctiva no swelling or erythema. Sinuses: No frontal/maxillary tenderness ENT/Mouth: EAC's clear, TM's nl w/o erythema, bulging. Nares clear w/o erythema, swelling, exudates. Oropharynx clear without erythema or exudates. Oral hygiene is good. Tongue normal, non obstructing. Hearing intact.  Neck: Supple. Thyroid not palpable. Car 2+/2+ without bruits, nodes or JVD. Chest: Respirations nl with BS clear & equal w/o rales, rhonchi, wheezing or stridor.  Cor: Heart sounds normal w/ regular rate and rhythm without sig. murmurs, gallops, clicks or rubs. Peripheral pulses normal and equal  without edema.  Abdomen: Soft & bowel sounds normal. Non-tender w/o guarding, rebound, hernias, masses or organomegaly.  Lymphatics: Unremarkable.  Musculoskeletal: Full ROM all peripheral extremities, joint  stability, 5/5 strength and normal gait.  Skin: Warm, dry without exposed rashes, lesions or ecchymosis apparent.  Neuro: Cranial nerves intact, reflexes equal bilaterally. Sensory-motor testing grossly intact. Tendon reflexes grossly intact.  Pysch: Alert & oriented x 3.  Insight and judgement nl & appropriate. No ideations.  Assessment and Plan:  1. Essential hypertension  - Continue medication, monitor blood pressure at home.  - Continue DASH diet.  Reminder to go to the ER if any CP,  SOB, nausea, dizziness, severe HA, changes vision/speech.  - CBC with Differential/Platelet - COMPLETE METABOLIC PANEL WITH GFR - Magnesium - TSH  2. Hyperlipidemia, mixed  - Continue diet/meds, exercise,& lifestyle modifications.  - Continue monitor periodic cholesterol/liver & renal functions   - Lipid panel - TSH  3. Abnormal glucose  - Continue diet, exercise  - Lifestyle modifications.  - Monitor appropriate labs.  - Hemoglobin A1c - Insulin, random  4. Vitamin D deficiency  - Continue supplementation.  - VITAMIN D 25 Hydroxyl  5. Hypothyroidism  - TSH  6. Gastroesophageal reflux disease  - CBC with Differential/Platelet  7. Medication management  - CBC with Differential/Platelet - COMPLETE METABOLIC PANEL WITH GFR -  Magnesium - Lipid panel - TSH - Hemoglobin A1c - Insulin, random - VITAMIN D 25 Hydroxyl         Discussed  regular exercise, BP monitoring, weight control to achieve/maintain BMI less than 25 and discussed med and SE's. Recommended labs to assess and monitor clinical status with further disposition pending results of labs. Over 30 minutes of exam, counseling, chart review was performed.

## 2018-05-08 ENCOUNTER — Ambulatory Visit: Payer: 59 | Admitting: Internal Medicine

## 2018-05-08 ENCOUNTER — Other Ambulatory Visit: Payer: Self-pay

## 2018-05-08 ENCOUNTER — Encounter: Payer: Self-pay | Admitting: Internal Medicine

## 2018-05-08 VITALS — BP 124/82 | HR 72 | Temp 96.9°F | Resp 16 | Ht 71.0 in | Wt 202.6 lb

## 2018-05-08 DIAGNOSIS — K219 Gastro-esophageal reflux disease without esophagitis: Secondary | ICD-10-CM

## 2018-05-08 DIAGNOSIS — R7309 Other abnormal glucose: Secondary | ICD-10-CM | POA: Diagnosis not present

## 2018-05-08 DIAGNOSIS — Z79899 Other long term (current) drug therapy: Secondary | ICD-10-CM

## 2018-05-08 DIAGNOSIS — I10 Essential (primary) hypertension: Secondary | ICD-10-CM

## 2018-05-08 DIAGNOSIS — E782 Mixed hyperlipidemia: Secondary | ICD-10-CM

## 2018-05-08 DIAGNOSIS — E559 Vitamin D deficiency, unspecified: Secondary | ICD-10-CM | POA: Diagnosis not present

## 2018-05-08 DIAGNOSIS — E039 Hypothyroidism, unspecified: Secondary | ICD-10-CM

## 2018-05-09 LAB — COMPLETE METABOLIC PANEL WITH GFR
AG Ratio: 2 (calc) (ref 1.0–2.5)
ALT: 14 U/L (ref 9–46)
AST: 11 U/L (ref 10–35)
Albumin: 4.3 g/dL (ref 3.6–5.1)
Alkaline phosphatase (APISO): 52 U/L (ref 35–144)
BILIRUBIN TOTAL: 0.4 mg/dL (ref 0.2–1.2)
BUN: 21 mg/dL (ref 7–25)
CO2: 25 mmol/L (ref 20–32)
Calcium: 9.2 mg/dL (ref 8.6–10.3)
Chloride: 104 mmol/L (ref 98–110)
Creat: 1.16 mg/dL (ref 0.70–1.25)
GFR, Est African American: 77 mL/min/{1.73_m2} (ref >=60)
GFR, Est Non African American: 66 mL/min/{1.73_m2} (ref >=60)
Globulin: 2.1 g/dL (calc) (ref 1.9–3.7)
Glucose, Bld: 93 mg/dL (ref 65–99)
Potassium: 4.3 mmol/L (ref 3.5–5.3)
Sodium: 138 mmol/L (ref 135–146)
Total Protein: 6.4 g/dL (ref 6.1–8.1)

## 2018-05-09 LAB — CBC WITH DIFFERENTIAL/PLATELET
Absolute Monocytes: 800 cells/uL (ref 200–950)
Basophils Absolute: 52 cells/uL (ref 0–200)
Basophils Relative: 0.6 %
Eosinophils Absolute: 103 cells/uL (ref 15–500)
Eosinophils Relative: 1.2 %
HEMATOCRIT: 41.4 % (ref 38.5–50.0)
Hemoglobin: 14.2 g/dL (ref 13.2–17.1)
LYMPHS ABS: 1453 {cells}/uL (ref 850–3900)
MCH: 31.3 pg (ref 27.0–33.0)
MCHC: 34.3 g/dL (ref 32.0–36.0)
MCV: 91.4 fL (ref 80.0–100.0)
MPV: 11.9 fL (ref 7.5–12.5)
Monocytes Relative: 9.3 %
Neutro Abs: 6192 cells/uL (ref 1500–7800)
Neutrophils Relative %: 72 %
Platelets: 289 10*3/uL (ref 140–400)
RBC: 4.53 10*6/uL (ref 4.20–5.80)
RDW: 13.1 % (ref 11.0–15.0)
Total Lymphocyte: 16.9 %
WBC: 8.6 10*3/uL (ref 3.8–10.8)

## 2018-05-09 LAB — INSULIN, RANDOM: Insulin: 10.5 u[IU]/mL

## 2018-05-09 LAB — HEMOGLOBIN A1C
Hgb A1c MFr Bld: 5.8 % of total Hgb — ABNORMAL HIGH (ref <5.7)
Mean Plasma Glucose: 120 (calc)
eAG (mmol/L): 6.6 (calc)

## 2018-05-09 LAB — VITAMIN D 25 HYDROXY (VIT D DEFICIENCY, FRACTURES): Vit D, 25-Hydroxy: 59 ng/mL (ref 30–100)

## 2018-05-09 LAB — LIPID PANEL
Cholesterol: 172 mg/dL (ref <200)
HDL: 63 mg/dL (ref >=40)
LDL Cholesterol (Calc): 94 mg/dL (calc)
Non-HDL Cholesterol (Calc): 109 mg/dL (calc) (ref <130)
Total CHOL/HDL Ratio: 2.7 (calc) (ref <5.0)
Triglycerides: 67 mg/dL (ref <150)

## 2018-05-09 LAB — TSH: TSH: 3.57 mIU/L (ref 0.40–4.50)

## 2018-05-09 LAB — MAGNESIUM: Magnesium: 1.9 mg/dL (ref 1.5–2.5)

## 2018-06-21 ENCOUNTER — Other Ambulatory Visit: Payer: Self-pay | Admitting: Internal Medicine

## 2018-06-21 DIAGNOSIS — F419 Anxiety disorder, unspecified: Secondary | ICD-10-CM

## 2018-06-21 MED ORDER — ALPRAZOLAM 0.5 MG PO TABS: ORAL_TABLET | ORAL | 0 refills | Status: DC

## 2018-08-06 NOTE — Progress Notes (Signed)
FOLLOW UP  Assessment and Plan:   Hypertension Well controlled with current medications  Monitor blood pressure at home; patient to call if consistently greater than 130/80 Continue DASH diet.   Reminder to go to the ER if any CP, SOB, nausea, dizziness, severe HA, changes vision/speech, left arm numbness and tingling and jaw pain.  Cholesterol Currently remains mildly above goal; discussed increasing atorvastatin to 80 mg pending results from today Continue low cholesterol diet and exercise.  Check lipid panel.   Abnormal glucose Continue diet and exercise.  Perform daily foot/skin check, notify office of any concerning changes.  Check A1C  Overweight Long discussion about weight loss, diet, and exercise Recommended diet heavy in fruits and veggies and low in animal meats, cheeses, and dairy products, appropriate calorie intake Discussed ideal weight for height  and initial weight goal (209 lb) Patient will work on eliminating soft drinks and chips, reducing dinner portions Will follow up in 3 months  Vitamin D Def Below goal at last visit; continue supplementation to maintain goal of 70-100 Check Vit D level  Continue diet and meds as discussed. Further disposition pending results of labs. Discussed med's effects and SE's.   Over 30 minutes of exam, counseling, chart review, and critical decision making was performed.   Future Appointments  Date Time Provider [place]  11/09/2018  9:00 AM Unk Pinto, MD [facility] None    ----------------------------------------------------------------------------------------------------------------------  HPI 63 y.o. male male with hx of  ASCAD s/p PTCA/Stent (2005) presents for 3 month follow up on hypertension, cholesterol, diabetes, weight and vitamin D deficiency.   Had hernia surgery in Jan, bilateral hernia repair, states he is having right inguinal pain with movement. 2 x in 30 days. Saturday was shining wheels on  his jeep, mowing yard and was "hobbling", felt better after 5-10 mins of lying flat.    he has a diagnosis of anxiety and is currently on xanax 0.25-0.5 mg PRN BID or TID, reports symptoms are well controlled on current regimen. States rarely takes, occ at night.   BMI is Body mass index is 27.98 kg/m., he has not been working on diet and exercise.  Wt Readings from Last 3 Encounters:  08/08/18 200 lb 9.6 oz (91 kg)  05/08/18 202 lb 9.6 oz (91.9 kg)  02/06/18 197 lb 6.4 oz (89.5 kg)   His blood pressure has been controlled at home, today their BP is BP: 120/90  He does workout. He denies chest pain, shortness of breath, dizziness.   He is on cholesterol medication (atorvastatin 40 mg daily) and denies myalgias. His cholesterol is not at goal. The cholesterol last visit was:   Lab Results  Component Value Date   CHOL 172 05/08/2018   HDL 63 05/08/2018   LDLCALC 94 05/08/2018   LDLDIRECT 190.6 07/30/2010   TRIG 67 05/08/2018   CHOLHDL 2.7 05/08/2018    He has not been working on diet and exercise for prediabetes, and denies foot ulcerations, increased appetite, nausea, paresthesia of the feet, polydipsia, polyuria, visual disturbances, vomiting and weight loss. Last A1C in the office was:  Lab Results  Component Value Date   HGBA1C 5.8 (H) 05/08/2018   Patient is on Vitamin D supplement.    Lab Results  Component Value Date   VD25OH 59 05/08/2018        Current Medications:  Current Outpatient Medications on File Prior to Visit  Medication Sig  . ALPRAZolam (XANAX) 0.5 MG tablet Take 1/2 to 1 tablet at  bedtime ONLY if needed for sleep  . aspirin EC 81 MG tablet 2 tabs po qd  . atorvastatin (LIPITOR) 80 MG tablet TAKE ONE TABLET BY MOUTH ONE TIME DAILY FOR CHOLESTEROL  . Cholecalciferol (VITAMIN D-3) 5000 UNITS TABS Take 1 tablet by mouth 2 (two) times daily.   . Cyanocobalamin (VITAMIN B 12 PO) Take 1 tablet by mouth daily.  Marland Kitchen esomeprazole (NEXIUM) 20 MG capsule Take 20 mg  by mouth daily at 12 noon. Takes OTC 20 mg  . finasteride (PROSCAR) 5 MG tablet Take half tablet daily  . lisinopril (PRINIVIL,ZESTRIL) 10 MG tablet TAKE 1 TABLET BY MOUTH ONCE DAILY FOR BLOOD PRESSURE  . sildenafil (REVATIO) 20 MG tablet TAKE 1 TO 5 TABS BY MOUTH DAILY AS NEEDED.   No current facility-administered medications on file prior to visit.      Allergies:  Allergies  Allergen Reactions  . Penicillins     unspecified     Medical History:  Past Medical History:  Diagnosis Date  . ANXIETY 12/25/2007  . ANXIETY STATE NEC 11/01/2006  . CORONARY ARTERY DISEASE 08/02/2006   off Plavix x 3 years  . DIVERTICULITIS OF COLON 01/06/2010  . DVT, HX OF 08/02/2006  . 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 Hx of adenomatous polyp of colon 06/09/2010  . HYPERLIPIDEMIA 08/02/2006  . HYPERTENSION 08/05/2008  . Hypothyroidism 03/19/2014  . Memory loss 07/21/2009  . Pilonidal cyst    low back pain   Family history- Reviewed and unchanged Social history- Reviewed and unchanged   Review of Systems:  Review of Systems  Constitutional: Negative for malaise/fatigue and weight loss.  HENT: Negative for hearing loss and tinnitus.   Eyes: Negative for blurred vision and double vision.  Respiratory: Negative for cough, shortness of breath and wheezing.   Cardiovascular: Negative for chest pain, palpitations, orthopnea, claudication, leg swelling and PND.  Gastrointestinal: Negative for abdominal pain, blood in stool, constipation, diarrhea, heartburn, melena, nausea and vomiting.  Genitourinary: Negative.   Musculoskeletal: Negative for joint pain and myalgias.  Skin: Negative for rash.  Neurological: Negative for dizziness, tingling, sensory change, weakness and headaches.  Endo/Heme/Allergies: Negative for polydipsia.  Psychiatric/Behavioral: Negative.   All other systems reviewed and are negative.    Physical Exam: BP 120/90   Pulse 76   Temp (!) 97.3 F (36.3 C)   Ht 5\' 11"  (1.803 m)    Wt 200 lb 9.6 oz (91 kg)   SpO2 98%   BMI 27.98 kg/m  Wt Readings from Last 3 Encounters:  08/08/18 200 lb 9.6 oz (91 kg)  05/08/18 202 lb 9.6 oz (91.9 kg)  02/06/18 197 lb 6.4 oz (89.5 kg)   General Appearance: Well nourished, in no apparent distress. Eyes: PERRLA, EOMs, conjunctiva no swelling or erythema Sinuses: No Frontal/maxillary tenderness ENT/Mouth: Ext aud canals clear, TMs without erythema, bulging. No erythema, swelling, or exudate on post pharynx.  Tonsils not swollen or erythematous. Hearing normal.  Neck: Supple, thyroid normal.  Respiratory: Respiratory effort normal, BS equal bilaterally without rales, rhonchi, wheezing or stridor.  Cardio: RRR with no MRGs. Brisk peripheral pulses with bilateral ankles 2+ pitting edema.  Abdomen: Soft, + BS.  Non tender, no guarding, rebound, hernias, masses. Lymphatics: Non tender without lymphadenopathy.  Musculoskeletal: Full ROM, 5/5 strength, Normal gait Skin: Warm, dry without rashes, lesions, ecchymosis.  Neuro: Cranial nerves intact. No cerebellar symptoms.  Psych: Awake and oriented X 3, normal affect, Insight and Judgment appropriate.    Vicie Mutters, PA-C  11:43 AM [facility]

## 2018-08-08 ENCOUNTER — Other Ambulatory Visit: Payer: Self-pay

## 2018-08-08 ENCOUNTER — Encounter: Payer: Self-pay | Admitting: Physician Assistant

## 2018-08-08 ENCOUNTER — Ambulatory Visit (INDEPENDENT_AMBULATORY_CARE_PROVIDER_SITE_OTHER): Payer: 59 | Admitting: Physician Assistant

## 2018-08-08 VITALS — BP 120/90 | HR 76 | Temp 97.3°F | Ht 71.0 in | Wt 200.6 lb

## 2018-08-08 DIAGNOSIS — E559 Vitamin D deficiency, unspecified: Secondary | ICD-10-CM | POA: Diagnosis not present

## 2018-08-08 DIAGNOSIS — E782 Mixed hyperlipidemia: Secondary | ICD-10-CM | POA: Diagnosis not present

## 2018-08-08 DIAGNOSIS — I10 Essential (primary) hypertension: Secondary | ICD-10-CM

## 2018-08-08 DIAGNOSIS — R7309 Other abnormal glucose: Secondary | ICD-10-CM

## 2018-08-08 DIAGNOSIS — Z79899 Other long term (current) drug therapy: Secondary | ICD-10-CM | POA: Diagnosis not present

## 2018-08-09 LAB — HEMOGLOBIN A1C
Hgb A1c MFr Bld: 5.9 % of total Hgb — ABNORMAL HIGH (ref <5.7)
Mean Plasma Glucose: 123 (calc)
eAG (mmol/L): 6.8 (calc)

## 2018-08-09 LAB — CBC WITH DIFFERENTIAL/PLATELET
Absolute Monocytes: 559 cells/uL (ref 200–950)
Basophils Absolute: 43 cells/uL (ref 0–200)
Basophils Relative: 0.5 %
Eosinophils Absolute: 103 cells/uL (ref 15–500)
Eosinophils Relative: 1.2 %
HCT: 43.2 % (ref 38.5–50.0)
Hemoglobin: 15.2 g/dL (ref 13.2–17.1)
Lymphs Abs: 1316 cells/uL (ref 850–3900)
MCH: 31.6 pg (ref 27.0–33.0)
MCHC: 35.2 g/dL (ref 32.0–36.0)
MCV: 89.8 fL (ref 80.0–100.0)
MPV: 12.2 fL (ref 7.5–12.5)
Monocytes Relative: 6.5 %
Neutro Abs: 6579 cells/uL (ref 1500–7800)
Neutrophils Relative %: 76.5 %
Platelets: 273 10*3/uL (ref 140–400)
RBC: 4.81 10*6/uL (ref 4.20–5.80)
RDW: 13.3 % (ref 11.0–15.0)
Total Lymphocyte: 15.3 %
WBC: 8.6 10*3/uL (ref 3.8–10.8)

## 2018-08-09 LAB — COMPLETE METABOLIC PANEL WITH GFR
AG Ratio: 2.1 (calc) (ref 1.0–2.5)
ALT: 19 U/L (ref 9–46)
AST: 15 U/L (ref 10–35)
Albumin: 4.7 g/dL (ref 3.6–5.1)
Alkaline phosphatase (APISO): 55 U/L (ref 35–144)
BUN: 21 mg/dL (ref 7–25)
CO2: 25 mmol/L (ref 20–32)
Calcium: 10.4 mg/dL — ABNORMAL HIGH (ref 8.6–10.3)
Chloride: 105 mmol/L (ref 98–110)
Creat: 1.14 mg/dL (ref 0.70–1.25)
GFR, Est African American: 78 mL/min/{1.73_m2} (ref >=60)
GFR, Est Non African American: 68 mL/min/{1.73_m2} (ref >=60)
Globulin: 2.2 g/dL (calc) (ref 1.9–3.7)
Glucose, Bld: 104 mg/dL — ABNORMAL HIGH (ref 65–99)
Potassium: 4.8 mmol/L (ref 3.5–5.3)
Sodium: 138 mmol/L (ref 135–146)
Total Bilirubin: 0.5 mg/dL (ref 0.2–1.2)
Total Protein: 6.9 g/dL (ref 6.1–8.1)

## 2018-08-09 LAB — LIPID PANEL
Cholesterol: 186 mg/dL (ref <200)
HDL: 54 mg/dL (ref >=40)
LDL Cholesterol (Calc): 106 mg/dL (calc) — ABNORMAL HIGH
Non-HDL Cholesterol (Calc): 132 mg/dL (calc) — ABNORMAL HIGH (ref <130)
Total CHOL/HDL Ratio: 3.4 (calc) (ref <5.0)
Triglycerides: 143 mg/dL (ref <150)

## 2018-08-09 LAB — MAGNESIUM: Magnesium: 2 mg/dL (ref 1.5–2.5)

## 2018-08-09 LAB — TSH: TSH: 2.84 mIU/L (ref 0.40–4.50)

## 2018-08-15 ENCOUNTER — Other Ambulatory Visit: Payer: Self-pay | Admitting: Surgery

## 2018-08-15 DIAGNOSIS — R1031 Right lower quadrant pain: Secondary | ICD-10-CM

## 2018-08-24 ENCOUNTER — Other Ambulatory Visit: Payer: 59

## 2018-09-11 ENCOUNTER — Ambulatory Visit
Admission: RE | Admit: 2018-09-11 | Discharge: 2018-09-11 | Disposition: A | Discharge status: Home / Self Care | Payer: 59 | Source: Ambulatory Visit | Attending: Surgery | Admitting: Surgery

## 2018-09-11 ENCOUNTER — Other Ambulatory Visit: Payer: Self-pay

## 2018-09-11 DIAGNOSIS — R1031 Right lower quadrant pain: Secondary | ICD-10-CM

## 2018-09-11 MED ORDER — IOPAMIDOL (ISOVUE-300) INJECTION 61%
100.0000 mL | Freq: Once | INTRAVENOUS | Status: AC | PRN
  Administered 2018-09-11: 100 mL via INTRAVENOUS

## 2018-09-13 ENCOUNTER — Other Ambulatory Visit: Payer: Self-pay

## 2018-09-13 DIAGNOSIS — Z20822 Contact with and (suspected) exposure to covid-19: Secondary | ICD-10-CM

## 2018-09-14 LAB — SPECIMEN STATUS REPORT

## 2018-09-14 LAB — NOVEL CORONAVIRUS, NAA: SARS-CoV-2, NAA: NOT DETECTED

## 2018-09-17 ENCOUNTER — Telehealth: Payer: Self-pay

## 2018-09-17 NOTE — Telephone Encounter (Signed)
-----   Message from Biagio Borg, MD sent at 09/15/2018  4:01 PM EDT ----- Left message on MyChart, pt to cont same tx  Zaryah Seckel to please inform pt, COVID is neg

## 2018-09-17 NOTE — Telephone Encounter (Signed)
Pt has viewed results via MyChart  

## 2018-09-19 ENCOUNTER — Other Ambulatory Visit: Payer: Self-pay | Admitting: Internal Medicine

## 2018-09-19 DIAGNOSIS — F419 Anxiety disorder, unspecified: Secondary | ICD-10-CM

## 2018-09-25 ENCOUNTER — Other Ambulatory Visit: Payer: Self-pay | Admitting: Surgery

## 2018-10-14 ENCOUNTER — Other Ambulatory Visit: Payer: Self-pay | Admitting: Internal Medicine

## 2018-10-23 ENCOUNTER — Other Ambulatory Visit: Payer: Self-pay | Admitting: Surgery

## 2018-10-31 ENCOUNTER — Other Ambulatory Visit: Payer: Self-pay

## 2018-10-31 ENCOUNTER — Encounter (HOSPITAL_BASED_OUTPATIENT_CLINIC_OR_DEPARTMENT_OTHER): Payer: Self-pay | Admitting: *Deleted

## 2018-10-31 NOTE — Progress Notes (Signed)
Chart reviewed with DR Hatchett, pt will not need cards clearance for surgery. Has hx CAD-stent2005, ASA therapy only, managed by PCP.

## 2018-11-01 ENCOUNTER — Encounter (HOSPITAL_BASED_OUTPATIENT_CLINIC_OR_DEPARTMENT_OTHER)
Admission: RE | Admit: 2018-11-01 | Discharge: 2018-11-01 | Disposition: A | Discharge status: Home / Self Care | Payer: 59 | Source: Ambulatory Visit | Attending: Surgery | Admitting: Surgery

## 2018-11-01 ENCOUNTER — Encounter (HOSPITAL_COMMUNITY)
Admission: RE | Admit: 2018-11-01 | Discharge: 2018-11-01 | Disposition: A | Discharge status: Home / Self Care | Payer: 59 | Source: Ambulatory Visit | Attending: Surgery | Admitting: Surgery

## 2018-11-01 DIAGNOSIS — Z01818 Encounter for other preprocedural examination: Secondary | ICD-10-CM | POA: Insufficient documentation

## 2018-11-01 DIAGNOSIS — Z20828 Contact with and (suspected) exposure to other viral communicable diseases: Secondary | ICD-10-CM | POA: Insufficient documentation

## 2018-11-01 NOTE — Progress Notes (Signed)

## 2018-11-02 LAB — NOVEL CORONAVIRUS, NAA (HOSP ORDER, SEND-OUT TO REF LAB; TAT 18-24 HRS): SARS-CoV-2, NAA: NOT DETECTED

## 2018-11-04 NOTE — H&P (Signed)
  [name] Documented: 10/23/2018 3:13 PM Location: [facility] Patient #: [phone] DOB: 1955-02-20 Married / Language: English / Race: White Male   History of Present Illness ([name] [name] MD; 10/23/2018 3:49 PM) The patient is a 63 year old male who presents for an evaluation of a hernia. He is here for further evaluation of his ongoing right groin pain. It had completely resolved within recurred after heavy lifting last week and this weekend. He describes an intermittent sharp pain as well as a bulge in the right area. Again, he is status post laparoscopic bilateral inguinal hernia repair with mesh earlier this year  Medical History:      Past Medical History:  Diagnosis Date  . ANXIETY 12/25/2007  . ANXIETY STATE NEC 11/01/2006  . CORONARY ARTERY DISEASE 08/02/2006   off Plavix x 3 years  . DIVERTICULITIS OF COLON 01/06/2010  . DVT, HX OF 08/02/2006  . 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 Hx of adenomatous polyp of colon 06/09/2010  . HYPERLIPIDEMIA 08/02/2006  . HYPERTENSION 08/05/2008  . Hypothyroidism 03/19/2014  . Memory loss 07/21/2009  . Pilonidal cyst    low back pain    Allergies [name], CMA; 10/23/2018 3:14 PM) Penicillins  Allergies Reconciled   Medication History [name], CMA; 10/23/2018 3:14 PM) ALPRAZolam (0.5MG  Tablet, Oral) Active. Atorvastatin Calcium (80MG  Tablet, Oral) Active. Finasteride (5MG  Tablet, Oral) Active. Lisinopril (10MG  Tablet, Oral) Active. Medications Reconciled  Vitals [name] CMA; 10/23/2018 3:14 PM) 10/23/2018 3:14 PM Weight: 198.8 lb Height: 71in Body Surface Area: 2.1 m Body Mass Index: 27.73 kg/m  Temp.: 98.67F  Pulse: 78 (Regular)  BP: 134/80(Sitting, Left Arm, Standard)       Physical Exam ([name] [name] MD; 10/23/2018 3:50 PM) The physical exam findings are as follows: Note: On physical examination he indeed has a recurrent right inguinal hernia  which is easily reducible. Generally well in appearance Lungs clear CV RRR Abdomen otherwise soft, NT Skin without erythema   Assessment & Plan ([name] [name] MD; 10/23/2018 3:51 PM)  RECURRENT RIGHT INGUINAL HERNIA (K40.91) Impression: Unfortunately, he does have a recurrent right inguinal hernia status post bilateral laparoscopic inguinal hernia repair with mesh. I discussed this with him in detail. He is increasingly symptomatic. An open repair with mesh is strongly recommended. We again discussed the risk of surgery. These risks include but are not limited to bleeding, infection, injury to surrounding structures, nerve entrapment, chronic pain, hernia recurrence, cardiopulmonary issues, etc. He understands and wishes to proceed with surgery

## 2018-11-05 ENCOUNTER — Other Ambulatory Visit: Payer: Self-pay

## 2018-11-05 ENCOUNTER — Encounter (HOSPITAL_BASED_OUTPATIENT_CLINIC_OR_DEPARTMENT_OTHER)
Admission: RE | Disposition: A | Discharge status: Home / Self Care | Payer: Self-pay | Source: Home / Self Care | Attending: Surgery

## 2018-11-05 ENCOUNTER — Ambulatory Visit (HOSPITAL_BASED_OUTPATIENT_CLINIC_OR_DEPARTMENT_OTHER)
Admission: RE | Admit: 2018-11-05 | Discharge: 2018-11-05 | Disposition: A | Discharge status: Home / Self Care | Payer: 59 | Attending: Surgery | Admitting: Surgery

## 2018-11-05 ENCOUNTER — Ambulatory Visit (HOSPITAL_BASED_OUTPATIENT_CLINIC_OR_DEPARTMENT_OTHER): Payer: 59 | Admitting: Certified Registered"

## 2018-11-05 ENCOUNTER — Encounter (HOSPITAL_BASED_OUTPATIENT_CLINIC_OR_DEPARTMENT_OTHER): Payer: Self-pay

## 2018-11-05 DIAGNOSIS — E663 Overweight: Secondary | ICD-10-CM | POA: Insufficient documentation

## 2018-11-05 DIAGNOSIS — E785 Hyperlipidemia, unspecified: Secondary | ICD-10-CM | POA: Diagnosis not present

## 2018-11-05 DIAGNOSIS — Z955 Presence of coronary angioplasty implant and graft: Secondary | ICD-10-CM | POA: Insufficient documentation

## 2018-11-05 DIAGNOSIS — F419 Anxiety disorder, unspecified: Secondary | ICD-10-CM | POA: Insufficient documentation

## 2018-11-05 DIAGNOSIS — Z79899 Other long term (current) drug therapy: Secondary | ICD-10-CM | POA: Diagnosis not present

## 2018-11-05 DIAGNOSIS — K4091 Unilateral inguinal hernia, without obstruction or gangrene, recurrent: Secondary | ICD-10-CM | POA: Diagnosis present

## 2018-11-05 DIAGNOSIS — Z880 Allergy status to penicillin: Secondary | ICD-10-CM | POA: Diagnosis not present

## 2018-11-05 DIAGNOSIS — I10 Essential (primary) hypertension: Secondary | ICD-10-CM | POA: Diagnosis not present

## 2018-11-05 DIAGNOSIS — I2510 Atherosclerotic heart disease of native coronary artery without angina pectoris: Secondary | ICD-10-CM | POA: Insufficient documentation

## 2018-11-05 DIAGNOSIS — Z6827 Body mass index (BMI) 27.0-27.9, adult: Secondary | ICD-10-CM | POA: Insufficient documentation

## 2018-11-05 DIAGNOSIS — K219 Gastro-esophageal reflux disease without esophagitis: Secondary | ICD-10-CM | POA: Diagnosis not present

## 2018-11-05 HISTORY — DX: Unilateral inguinal hernia, without obstruction or gangrene, recurrent: K40.91

## 2018-11-05 HISTORY — PX: INGUINAL HERNIA REPAIR: SHX194

## 2018-11-05 SURGERY — REPAIR, HERNIA, INGUINAL, ADULT
Anesthesia: General | Site: Groin | Laterality: Right

## 2018-11-05 MED ORDER — FENTANYL CITRATE (PF) 100 MCG/2ML IJ SOLN
INTRAMUSCULAR | Status: DC | PRN
  Administered 2018-11-05 (×2): 25 ug via INTRAVENOUS

## 2018-11-05 MED ORDER — ACETAMINOPHEN 500 MG PO TABS
ORAL_TABLET | ORAL | Status: AC
  Filled 2018-11-05: qty 2

## 2018-11-05 MED ORDER — ONDANSETRON HCL 4 MG/2ML IJ SOLN
INTRAMUSCULAR | Status: DC | PRN
  Administered 2018-11-05: 4 mg via INTRAVENOUS

## 2018-11-05 MED ORDER — PROMETHAZINE HCL 25 MG/ML IJ SOLN
6.2500 mg | INTRAMUSCULAR | Status: DC | PRN
  Administered 2018-11-05: 13:00:00 6.25 mg via INTRAVENOUS

## 2018-11-05 MED ORDER — CIPROFLOXACIN IN D5W 400 MG/200ML IV SOLN
400.0000 mg | INTRAVENOUS | Status: AC
  Administered 2018-11-05: 11:00:00 400 mg via INTRAVENOUS

## 2018-11-05 MED ORDER — PROPOFOL 10 MG/ML IV BOLUS
INTRAVENOUS | Status: DC | PRN
  Administered 2018-11-05: 200 mg via INTRAVENOUS

## 2018-11-05 MED ORDER — LIDOCAINE 2% (20 MG/ML) 5 ML SYRINGE
INTRAMUSCULAR | Status: AC
  Filled 2018-11-05: qty 5

## 2018-11-05 MED ORDER — PHENYLEPHRINE 40 MCG/ML (10ML) SYRINGE FOR IV PUSH (FOR BLOOD PRESSURE SUPPORT)
PREFILLED_SYRINGE | INTRAVENOUS | Status: DC | PRN
  Administered 2018-11-05: 80 ug via INTRAVENOUS

## 2018-11-05 MED ORDER — FENTANYL CITRATE (PF) 100 MCG/2ML IJ SOLN
INTRAMUSCULAR | Status: AC
  Filled 2018-11-05: qty 2

## 2018-11-05 MED ORDER — CHLORHEXIDINE GLUCONATE CLOTH 2 % EX PADS: 6.0000 | MEDICATED_PAD | Freq: Once | CUTANEOUS | Status: DC

## 2018-11-05 MED ORDER — SCOPOLAMINE 1 MG/3DAYS TD PT72: 1.0000 | MEDICATED_PATCH | Freq: Once | TRANSDERMAL | Status: DC

## 2018-11-05 MED ORDER — PROPOFOL 10 MG/ML IV BOLUS
INTRAVENOUS | Status: AC
  Filled 2018-11-05: qty 20

## 2018-11-05 MED ORDER — EPHEDRINE 5 MG/ML INJ
INTRAVENOUS | Status: AC
  Filled 2018-11-05: qty 10

## 2018-11-05 MED ORDER — FENTANYL CITRATE (PF) 100 MCG/2ML IJ SOLN
25.0000 ug | INTRAMUSCULAR | Status: DC | PRN
  Administered 2018-11-05 (×2): 25 ug via INTRAVENOUS

## 2018-11-05 MED ORDER — OXYCODONE HCL 5 MG PO TABS: 5.0000 mg | ORAL_TABLET | Freq: Once | ORAL | Status: DC | PRN

## 2018-11-05 MED ORDER — MIDAZOLAM HCL 2 MG/2ML IJ SOLN
1.0000 mg | INTRAMUSCULAR | Status: DC | PRN
  Administered 2018-11-05: 2 mg via INTRAVENOUS

## 2018-11-05 MED ORDER — OXYCODONE HCL 5 MG/5ML PO SOLN: 5.0000 mg | Freq: Once | ORAL | Status: DC | PRN

## 2018-11-05 MED ORDER — ONDANSETRON HCL 4 MG/2ML IJ SOLN
INTRAMUSCULAR | Status: AC
  Filled 2018-11-05: qty 2

## 2018-11-05 MED ORDER — MEPERIDINE HCL 25 MG/ML IJ SOLN: 6.2500 mg | INTRAMUSCULAR | Status: DC | PRN

## 2018-11-05 MED ORDER — LACTATED RINGERS IV SOLN
INTRAVENOUS | Status: DC
  Administered 2018-11-05: 10:00:00 via INTRAVENOUS

## 2018-11-05 MED ORDER — BUPIVACAINE-EPINEPHRINE (PF) 0.5% -1:200000 IJ SOLN
INTRAMUSCULAR | Status: DC | PRN
  Administered 2018-11-05: 30 mL

## 2018-11-05 MED ORDER — FENTANYL CITRATE (PF) 100 MCG/2ML IJ SOLN
50.0000 ug | INTRAMUSCULAR | Status: DC | PRN
  Administered 2018-11-05: 100 ug via INTRAVENOUS

## 2018-11-05 MED ORDER — BUPIVACAINE-EPINEPHRINE 0.5% -1:200000 IJ SOLN
INTRAMUSCULAR | Status: DC | PRN
  Administered 2018-11-05: 10 mL

## 2018-11-05 MED ORDER — GABAPENTIN 300 MG PO CAPS
300.0000 mg | ORAL_CAPSULE | ORAL | Status: AC
  Administered 2018-11-05: 300 mg via ORAL

## 2018-11-05 MED ORDER — PHENYLEPHRINE 40 MCG/ML (10ML) SYRINGE FOR IV PUSH (FOR BLOOD PRESSURE SUPPORT)
PREFILLED_SYRINGE | INTRAVENOUS | Status: AC
  Filled 2018-11-05: qty 10

## 2018-11-05 MED ORDER — ACETAMINOPHEN 500 MG PO TABS
1000.0000 mg | ORAL_TABLET | ORAL | Status: AC
  Administered 2018-11-05: 1000 mg via ORAL

## 2018-11-05 MED ORDER — DEXAMETHASONE SODIUM PHOSPHATE 10 MG/ML IJ SOLN
INTRAMUSCULAR | Status: AC
  Filled 2018-11-05: qty 1

## 2018-11-05 MED ORDER — CIPROFLOXACIN IN D5W 400 MG/200ML IV SOLN
INTRAVENOUS | Status: AC
  Filled 2018-11-05: qty 200

## 2018-11-05 MED ORDER — LIDOCAINE 2% (20 MG/ML) 5 ML SYRINGE
INTRAMUSCULAR | Status: DC | PRN
  Administered 2018-11-05: 60 mg via INTRAVENOUS

## 2018-11-05 MED ORDER — GABAPENTIN 300 MG PO CAPS
ORAL_CAPSULE | ORAL | Status: AC
  Filled 2018-11-05: qty 1

## 2018-11-05 MED ORDER — TRAMADOL HCL 50 MG PO TABS: 50.0000 mg | ORAL_TABLET | Freq: Four times a day (QID) | ORAL | 0 refills | Status: DC | PRN

## 2018-11-05 MED ORDER — MIDAZOLAM HCL 2 MG/2ML IJ SOLN
INTRAMUSCULAR | Status: AC
  Filled 2018-11-05: qty 2

## 2018-11-05 MED ORDER — PROMETHAZINE HCL 25 MG/ML IJ SOLN
INTRAMUSCULAR | Status: AC
  Filled 2018-11-05: qty 1

## 2018-11-05 MED ORDER — DEXAMETHASONE SODIUM PHOSPHATE 10 MG/ML IJ SOLN
INTRAMUSCULAR | Status: DC | PRN
  Administered 2018-11-05: 10 mg via INTRAVENOUS

## 2018-11-05 SURGICAL SUPPLY — 42 items
BLADE CLIPPER SURG (BLADE) ×3 IMPLANT
BLADE HEX COATED 2.75 (ELECTRODE) ×3 IMPLANT
BLADE SURG 15 STRL LF DISP TIS (BLADE) ×1 IMPLANT
BLADE SURG 15 STRL SS (BLADE) ×2
CANISTER SUCTION 1200CC (MISCELLANEOUS) IMPLANT
CHLORAPREP W/TINT 26 (MISCELLANEOUS) ×3 IMPLANT
COVER BACK TABLE REUSABLE LG (DRAPES) ×3 IMPLANT
COVER MAYO STAND REUSABLE (DRAPES) ×3 IMPLANT
COVER WAND RF STERILE (DRAPES) IMPLANT
DECANTER SPIKE VIAL GLASS SM (MISCELLANEOUS) ×3 IMPLANT
DERMABOND ADVANCED (GAUZE/BANDAGES/DRESSINGS) ×2
DERMABOND ADVANCED .7 DNX12 (GAUZE/BANDAGES/DRESSINGS) ×1 IMPLANT
DRAIN PENROSE 1/2X12 LTX STRL (WOUND CARE) ×3 IMPLANT
DRAPE LAPAROTOMY 100X72 PEDS (DRAPES) ×3 IMPLANT
DRAPE UTILITY XL STRL (DRAPES) ×3 IMPLANT
ELECT REM PT RETURN 9FT ADLT (ELECTROSURGICAL) ×3
ELECTRODE REM PT RTRN 9FT ADLT (ELECTROSURGICAL) ×1 IMPLANT
GLOVE SURG SIGNA 7.5 PF LTX (GLOVE) ×3 IMPLANT
GOWN STRL REUS W/ TWL LRG LVL3 (GOWN DISPOSABLE) ×1 IMPLANT
GOWN STRL REUS W/ TWL XL LVL3 (GOWN DISPOSABLE) ×1 IMPLANT
GOWN STRL REUS W/TWL LRG LVL3 (GOWN DISPOSABLE) ×2
GOWN STRL REUS W/TWL XL LVL3 (GOWN DISPOSABLE) ×2
MESH PARIETEX PROGRIP RIGHT (Mesh General) ×3 IMPLANT
NEEDLE HYPO 25X1 1.5 SAFETY (NEEDLE) ×3 IMPLANT
NS IRRIG 1000ML POUR BTL (IV SOLUTION) ×3 IMPLANT
PACK BASIN DAY SURGERY FS (CUSTOM PROCEDURE TRAY) ×3 IMPLANT
PENCIL BUTTON HOLSTER BLD 10FT (ELECTRODE) ×3 IMPLANT
SLEEVE SCD COMPRESS KNEE MED (MISCELLANEOUS) ×3 IMPLANT
SPONGE INTESTINAL PEANUT (DISPOSABLE) IMPLANT
SPONGE LAP 4X18 RFD (DISPOSABLE) ×3 IMPLANT
SUT MNCRL AB 4-0 PS2 18 (SUTURE) ×3 IMPLANT
SUT SILK 2 0 SH (SUTURE) ×3 IMPLANT
SUT VIC AB 2-0 CT1 27 (SUTURE) ×4
SUT VIC AB 2-0 CT1 TAPERPNT 27 (SUTURE) ×2 IMPLANT
SUT VIC AB 3-0 CT1 27 (SUTURE) ×2
SUT VIC AB 3-0 CT1 27XBRD (SUTURE) ×1 IMPLANT
SYR BULB 3OZ (MISCELLANEOUS) IMPLANT
SYR CONTROL 10ML LL (SYRINGE) ×3 IMPLANT
TOWEL GREEN STERILE FF (TOWEL DISPOSABLE) ×3 IMPLANT
TUBE CONNECTING 20'X1/4 (TUBING)
TUBE CONNECTING 20X1/4 (TUBING) IMPLANT
YANKAUER SUCT BULB TIP NO VENT (SUCTIONS) IMPLANT

## 2018-11-05 NOTE — Progress Notes (Signed)
Assisted Dr. Germeroth with right, ultrasound guided, transabdominal plane block. Side rails up, monitors on throughout procedure. See vital signs in flow sheet. Tolerated Procedure well. 

## 2018-11-05 NOTE — Interval H&P Note (Signed)
History and Physical Interval Note: no change in H and P  11/05/2018 9:59 AM  [name]  has presented today for surgery, with the diagnosis of RECURRENT RIGHT INGUINAL HERNIA.  The various methods of treatment have been discussed with the patient and family. After consideration of risks, benefits and other options for treatment, the patient has consented to  Procedure(s) with comments: OPEN RIGHT INGUINAL HERNIA REPAIR WITH MESH (Right) - TAP BLOCK as a surgical intervention.  The patient's history has been reviewed, patient examined, no change in status, stable for surgery.  I have reviewed the patient's chart and labs.  Questions were answered to the patient's satisfaction.     Coralie Keens

## 2018-11-05 NOTE — Discharge Instructions (Signed)
[facility] _______[facility]  UMBILICAL OR INGUINAL HERNIA REPAIR: POST OP INSTRUCTIONS  Always review your discharge instruction sheet given to you by the facility where your surgery was performed. IF YOU HAVE DISABILITY OR FAMILY LEAVE FORMS, YOU MUST BRING THEM TO THE OFFICE FOR PROCESSING.   DO NOT GIVE THEM TO YOUR DOCTOR.  1. A  prescription for pain medication may be given to you upon discharge.  Take your pain medication as prescribed, if needed.  If narcotic pain medicine is not needed, then you may take acetaminophen (Tylenol) or ibuprofen (Advil) as needed. 2. Take your usually prescribed medications unless otherwise directed. If you need a refill on your pain medication, please contact your pharmacy.  They will contact our office to request authorization. Prescriptions will not be filled after 5 pm or on week-ends. 3. You should follow a light diet the first 24 hours after arrival home, such as soup and crackers, etc.  Be sure to include lots of fluids daily.  Resume your normal diet the day after surgery. 4.Most patients will experience some swelling and bruising around the umbilicus or in the groin and scrotum.  Ice packs and reclining will help.  Swelling and bruising can take several days to resolve.  6. It is common to experience some constipation if taking pain medication after surgery.  Increasing fluid intake and taking a stool softener (such as Colace) will usually help or prevent this problem from occurring.  A mild laxative (Milk of Magnesia or Miralax) should be taken according to package directions if there are no bowel movements after 48 hours. 7. Unless discharge instructions indicate otherwise, you may remove your bandages 24-48 hours after surgery, and you may shower at that time.  You may have steri-strips (small skin tapes) in place directly over the incision.  These strips should be left on the skin for 7-10 days.  If your surgeon used skin glue on the  incision, you may shower in 24 hours.  The glue will flake off over the next 2-3 weeks.  Any sutures or staples will be removed at the office during your follow-up visit. 8. ACTIVITIES:  You may resume regular (light) daily activities beginning the next day--such as daily self-care, walking, climbing stairs--gradually increasing activities as tolerated.  You may have sexual intercourse when it is comfortable.  Refrain from any heavy lifting or straining until approved by your doctor.  a.You may drive when you are no longer taking prescription pain medication, you can comfortably wear a seatbelt, and you can safely maneuver your car and apply brakes. b.RETURN TO WORK:   _____________________________________________  9.You should see your doctor in the office for a follow-up appointment approximately 2-3 weeks after your surgery.  Make sure that you call for this appointment within a day or two after you arrive home to insure a convenient appointment time. 10.OTHER INSTRUCTIONS: __NO LIFTING MORE THAN 15 POUNDS FOR 4 WEEKS ICE PACK, TYLENOL, AND IBUPROFEN ALSO FOR PAIN OK TO SHOWER STARTING TOMORROW_______________________    _____________________________________  WHEN TO CALL YOUR DOCTOR: 1. Fever over 101.0 2. Inability to urinate 3. Nausea and/or vomiting 4. Extreme swelling or bruising 5. Continued bleeding from incision. 6. Increased pain, redness, or drainage from the incision  The clinic staff is available to answer your questions during regular business hours.  Please dont hesitate to call and ask to speak to one of the nurses for clinical concerns.  If you have a medical emergency, go to the nearest  emergency room or call 911.  A surgeon from [facility] is always on call at the hospital   [address] [place], [place], [place]  [address] ?  [address], [place], [place]   [address] [phone] ? [phone] ? FAX [phone] Web site:  [email or web address]  Special Instructions/Symptoms:  No tylenol until after 3:15 today.

## 2018-11-05 NOTE — Anesthesia Postprocedure Evaluation (Signed)
Anesthesia Post Note  Patient: [name]  Procedure(s) Performed: OPEN RIGHT INGUINAL HERNIA REPAIR WITH MESH (Right Groin)     Patient location during evaluation: PACU Anesthesia Type: General Level of consciousness: sedated and patient cooperative Pain management: pain level controlled Vital Signs Assessment: post-procedure vital signs reviewed and stable Respiratory status: spontaneous breathing Cardiovascular status: stable Anesthetic complications: no    Last Vitals:  Vitals:   11/05/18 1300 11/05/18 1330  BP: (!) 163/89 (!) 153/86  Pulse: 65 70  Resp: 20 20  Temp:    SpO2: 100% 95%    Last Pain:  Vitals:   11/05/18 1330  TempSrc:   PainSc: 2                  Nolon Nations

## 2018-11-05 NOTE — Anesthesia Procedure Notes (Signed)
Procedure Name: LMA Insertion Date/Time: 11/05/2018 10:53 AM Performed by: Genelle Bal, CRNA Pre-anesthesia Checklist: Patient identified, Emergency Drugs available, Suction available and Patient being monitored Patient Re-evaluated:Patient Re-evaluated prior to induction Oxygen Delivery Method: Circle system utilized Preoxygenation: Pre-oxygenation with 100% oxygen Induction Type: IV induction Ventilation: Mask ventilation without difficulty LMA: LMA inserted LMA Size: 4.0 Number of attempts: 1 Airway Equipment and Method: Bite block Placement Confirmation: positive ETCO2 Tube secured with: Tape Dental Injury: Teeth and Oropharynx as per pre-operative assessment

## 2018-11-05 NOTE — Anesthesia Preprocedure Evaluation (Signed)
Anesthesia Evaluation  Patient identified by MRN, date of birth, ID band Patient awake    Reviewed: Allergy & Precautions, NPO status , Patient's Chart, lab work & pertinent test results  Airway Mallampati: II  TM Distance: >3 FB Neck ROM: Full    Dental no notable dental hx. (+) Dental Advisory Given   Pulmonary neg pulmonary ROS,    Pulmonary exam normal breath sounds clear to auscultation       Cardiovascular hypertension, + CAD and + Cardiac Stents  Normal cardiovascular exam Rhythm:Regular Rate:Normal     Neuro/Psych negative neurological ROS  negative psych ROS   GI/Hepatic Neg liver ROS, GERD  ,  Endo/Other  Hypothyroidism   Renal/GU negative Renal ROS     Musculoskeletal negative musculoskeletal ROS (+)   Abdominal   Peds  Hematology negative hematology ROS (+)   Anesthesia Other Findings   Reproductive/Obstetrics negative OB ROS                            Anesthesia Physical Anesthesia Plan  ASA: III  Anesthesia Plan: General   Post-op Pain Management: GA combined w/ Regional for post-op pain   Induction: Intravenous  PONV Risk Score and Plan: 4 or greater and Ondansetron, Dexamethasone and Treatment may vary due to age or medical condition  Airway Management Planned: Oral ETT  Additional Equipment: None  Intra-op Plan:   Post-operative Plan: Extubation in OR  Informed Consent: I have reviewed the patients History and Physical, chart, labs and discussed the procedure including the risks, benefits and alternatives for the proposed anesthesia with the patient or authorized representative who has indicated his/her understanding and acceptance.     Dental advisory given  Plan Discussed with: CRNA  Anesthesia Plan Comments:         Anesthesia Quick Evaluation

## 2018-11-05 NOTE — Anesthesia Procedure Notes (Signed)
Anesthesia Regional Block: Pectoralis block   Pre-Anesthetic Checklist: ,, timeout performed, Correct Patient, Correct Site, Correct Laterality, Correct Procedure, Correct Position, site marked, Risks and benefits discussed,  Surgical consent,  Pre-op evaluation,  At surgeon's request and post-op pain management  Laterality: Right  Prep: chloraprep       Needles:   Needle Type: Stimiplex     Needle Length: 9cm      Additional Needles:   Procedures:,,,, ultrasound used (permanent image in chart),,,,  Narrative:  Start time: 11/05/2018 9:58 AM End time: 11/05/2018 10:07 AM Injection made incrementally with aspirations every 5 mL.  Performed by: Personally  Anesthesiologist: Nolon Nations, MD  Additional Notes: Much difficulty initially piercing skin. Patient tolerated fairly well. Good fascial spread noted.

## 2018-11-05 NOTE — Transfer of Care (Signed)
Immediate Anesthesia Transfer of Care Note  Patient: [name]  Procedure(s) Performed: OPEN RIGHT INGUINAL HERNIA REPAIR WITH MESH (Right Groin)  Patient Location: PACU  Anesthesia Type:GA combined with regional for post-op pain  Level of Consciousness: drowsy and patient cooperative  Airway & Oxygen Therapy: Patient Spontanous Breathing and Patient connected to face mask oxygen  Post-op Assessment: Report given to RN and Post -op Vital signs reviewed and stable  Post vital signs: Reviewed and stable  Last Vitals:  Vitals Value Taken Time  BP 150/83   Temp    Pulse 69 11/05/18 1144  Resp 13 11/05/18 1144  SpO2 99 % 11/05/18 1144  Vitals shown include unvalidated device data.  Last Pain:  Vitals:   11/05/18 0917  TempSrc: Oral  PainSc: 0-No pain      Patients Stated Pain Goal: 5 ([phone] [id number])  Complications: No apparent anesthesia complications

## 2018-11-05 NOTE — Op Note (Signed)
OPEN RIGHT INGUINAL HERNIA REPAIR WITH MESH  Procedure Note  [name] 11/05/2018   Pre-op Diagnosis: RECURRENT RIGHT INGUINAL HERNIA     Post-op Diagnosis: same  Procedure(s): OPEN RIGHT INGUINAL HERNIA REPAIR WITH MESH  Surgeon(s): Coralie Keens, MD  Assist:  Carlena Hurl, PA Anesthesia: General  Staff:  Circulator: Maurene Capes, RN Scrub Person: McDonough-Hughes, Delene Ruffini, RN  Estimated Blood Loss: Minimal               Findings: The patient was found to have a recurrent indirect right inguinal hernia.  It was repaired with a large piece of Prolene pro-grip mesh from [employer]  Procedure: The patient was brought to the operating room and identifies the correct patient.  He had already received a tap block by the anesthesiologist.  Is placed upon the operating room table and general anesthesia was induced.  His right lower quadrant was then prepped and draped in usual sterile fashion.  I anesthetized the skin in the right lower abdomen near the groin with Marcaine and then performed a longitudinal incision with a scalpel I then dissected down through Scarpa's fascia with electrocautery.  The externally fascia was then identified and opened with internal and external rings.  The patient's testicular cord and structures were controlled the Penrose drain.  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  There was a hernia sac coming through the internal ring and an indirect fashion.  The floor of the anal canal was still intact.  I reduced the small sac back through the internal ring and then closed off the ring with a 2-0 silk suture.  A large piece of Prolene pro-grip mesh was brought onto the field.  I placed against the pubic tubercle and sutured in place with a Vicryl suture.  I then brought around the cord structures and placed against the transversalis fascia.  Coverage of the inguinal floor internal  ring appeared to be achieved.  I then closed externally fascia over the top of the mesh with a running 2-0 Vicryl suture.  Hemostasis appeared to be achieved.  I then closed the Scarpa's fascia with interrupted 3-0 Vicryl sutures and closed the skin with a running 4-0 Monocryl.  Dermabond was then applied.  The patient tolerated the procedure well.  All the counts were correct at the end of the procedure.  The patient was then extubated in the operating room and taken in a stable addition to the recovery room.          Coralie Keens   Date: 11/05/2018  Time: 11:33 AM

## 2018-11-06 ENCOUNTER — Encounter (HOSPITAL_BASED_OUTPATIENT_CLINIC_OR_DEPARTMENT_OTHER): Payer: Self-pay | Admitting: Surgery

## 2018-11-09 ENCOUNTER — Encounter: Payer: Self-pay | Admitting: Internal Medicine

## 2018-11-09 ENCOUNTER — Other Ambulatory Visit: Payer: Self-pay | Admitting: Internal Medicine

## 2018-11-09 DIAGNOSIS — F419 Anxiety disorder, unspecified: Secondary | ICD-10-CM

## 2018-12-11 ENCOUNTER — Other Ambulatory Visit: Payer: Self-pay

## 2018-12-11 ENCOUNTER — Ambulatory Visit (INDEPENDENT_AMBULATORY_CARE_PROVIDER_SITE_OTHER): Payer: PPO | Admitting: Internal Medicine

## 2018-12-11 VITALS — BP 128/90 | HR 88 | Temp 97.0°F | Resp 16 | Ht 71.0 in | Wt 189.8 lb

## 2018-12-11 DIAGNOSIS — R7309 Other abnormal glucose: Secondary | ICD-10-CM | POA: Diagnosis not present

## 2018-12-11 DIAGNOSIS — N401 Enlarged prostate with lower urinary tract symptoms: Secondary | ICD-10-CM

## 2018-12-11 DIAGNOSIS — Z8249 Family history of ischemic heart disease and other diseases of the circulatory system: Secondary | ICD-10-CM | POA: Diagnosis not present

## 2018-12-11 DIAGNOSIS — Z79899 Other long term (current) drug therapy: Secondary | ICD-10-CM

## 2018-12-11 DIAGNOSIS — R7303 Prediabetes: Secondary | ICD-10-CM

## 2018-12-11 DIAGNOSIS — E782 Mixed hyperlipidemia: Secondary | ICD-10-CM | POA: Diagnosis not present

## 2018-12-11 DIAGNOSIS — Z136 Encounter for screening for cardiovascular disorders: Secondary | ICD-10-CM

## 2018-12-11 DIAGNOSIS — Z125 Encounter for screening for malignant neoplasm of prostate: Secondary | ICD-10-CM | POA: Diagnosis not present

## 2018-12-11 DIAGNOSIS — I2510 Atherosclerotic heart disease of native coronary artery without angina pectoris: Secondary | ICD-10-CM

## 2018-12-11 DIAGNOSIS — N138 Other obstructive and reflux uropathy: Secondary | ICD-10-CM

## 2018-12-11 DIAGNOSIS — Z111 Encounter for screening for respiratory tuberculosis: Secondary | ICD-10-CM

## 2018-12-11 DIAGNOSIS — I10 Essential (primary) hypertension: Secondary | ICD-10-CM

## 2018-12-11 DIAGNOSIS — Z1211 Encounter for screening for malignant neoplasm of colon: Secondary | ICD-10-CM

## 2018-12-11 DIAGNOSIS — E559 Vitamin D deficiency, unspecified: Secondary | ICD-10-CM | POA: Diagnosis not present

## 2018-12-11 DIAGNOSIS — R5383 Other fatigue: Secondary | ICD-10-CM | POA: Diagnosis not present

## 2018-12-11 DIAGNOSIS — F419 Anxiety disorder, unspecified: Secondary | ICD-10-CM

## 2018-12-11 DIAGNOSIS — Z0000 Encounter for general adult medical examination without abnormal findings: Secondary | ICD-10-CM

## 2018-12-11 DIAGNOSIS — Z0001 Encounter for general adult medical examination with abnormal findings: Secondary | ICD-10-CM

## 2018-12-11 DIAGNOSIS — E039 Hypothyroidism, unspecified: Secondary | ICD-10-CM

## 2018-12-11 MED ORDER — ALPRAZOLAM 0.5 MG PO TABS: ORAL_TABLET | ORAL | 0 refills | Status: DC

## 2018-12-11 NOTE — Patient Instructions (Addendum)

## 2018-12-11 NOTE — Progress Notes (Signed)
Annual  Screening/Preventative Visit  & Comprehensive Evaluation & Examination     This very nice 63 y.o. MWM  presents for a Screening /Preventative Visit & comprehensive evaluation and management of multiple medical co-morbidities.  Patient has been followed for HTN, HLD, Prediabetes and Vitamin D Deficiency.     HTN predates since 2010. In 2008 , he had ACS and had PCA/Stents by Dr Dorris Carnes. Then in 2015, he had a Negative Cardiolite & has done well since w/o suspect CP's. Patient's BP has been controlled at home.  Today's BP is near goal - 128/90. Patient denies any cardiac symptoms as chest pain, palpitations, shortness of breath, dizziness or ankle swelling.     Patient's hyperlipidemia is nat controlled with diet and medications. Patient denies myalgias or other medication SE's. Last lipids were not at goal:  Lab Results  Component Value Date   CHOL 186 08/08/2018   HDL 54 08/08/2018   LDLCALC 106 (H) 08/08/2018   TRIG 143 08/08/2018   CHOLHDL 3.4 08/08/2018      Patient has hx/o prediabetes (A1c6.1% / 2014&6.3% / 2015)and patient denies reactive hypoglycemic symptoms, visual blurring, diabetic polys or paresthesias. Last A1c was not at goal:  Lab Results  Component Value Date   HGBA1C 5.9 (H) 08/08/2018       Finally, patient has history of Vitamin D Deficiency ("43" / 2014 & "30" / 2016)  and last vitamin D was near goal:  Lab Results  Component Value Date   VD25OH 59 05/08/2018   Current Outpatient Medications on File Prior to Visit  Medication Sig  . ALPRAZolam (XANAX) 0.5 MG tablet TAKE 1/2 TO 1 TABLET AT BEDTIME ONLY IF NEEDED FOR SLEEP  . aspirin EC 81 MG tablet 81 mg daily.   Marland Kitchen atorvastatin (LIPITOR) 80 MG tablet TAKE ONE TABLET BY MOUTH ONE TIME DAILY FOR CHOLESTEROL  . Cholecalciferol (VITAMIN D) 50 MCG (2000 UT) tablet Take 2,000 Units by mouth 2 (two) times daily.  Marland Kitchen esomeprazole (NEXIUM) 20 MG capsule Take 20 mg by mouth daily at 12 noon. Takes OTC 20  mg  . finasteride (PROSCAR) 5 MG tablet Take half tablet daily  . lisinopril (ZESTRIL) 10 MG tablet TAKE 1 TABLET BY MOUTH ONCE DAILY FOR BLOOD PRESSURE  . sildenafil (REVATIO) 20 MG tablet TAKE 1 TO 5 TABS BY MOUTH DAILY AS NEEDED.  Marland Kitchen traMADol (ULTRAM) 50 MG tablet Take 1 tablet (50 mg total) by mouth every 6 (six) hours as needed for moderate pain or severe pain.   No current facility-administered medications on file prior to visit.    Allergies  Allergen Reactions  . Penicillins     unspecified   Past Medical History:  Diagnosis Date  . ANXIETY 12/25/2007  . ANXIETY STATE NEC 11/01/2006  . CORONARY ARTERY DISEASE 08/02/2006   off Plavix x 3 years  . DIVERTICULITIS OF COLON 01/06/2010  . DVT, HX OF 08/02/2006  . 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 Hx of adenomatous polyp of colon 06/09/2010  . HYPERLIPIDEMIA 08/02/2006  . HYPERTENSION 08/05/2008  . Hypothyroidism 03/19/2014  . Memory loss 07/21/2009  . Pilonidal cyst    low back pain  . Recurrent inguinal hernia    right   Health Maintenance  Topic Date Due  . TETANUS/TDAP  07/29/2020  . COLONOSCOPY  08/10/2022  . INFLUENZA VACCINE  Completed  . Hepatitis C Screening  Completed  . HIV Screening  Completed   Immunization History  Administered Date(s) Administered  .  Influenza,inj,Quad PF,6+ Mos 12/07/2016, 11/21/2018  . Influenza-Unspecified 01/28/2018  . PPD Test 07/24/2013, 07/30/2014, 09/16/2016, 10/23/2017  . Tdap 07/30/2010   Last Colon - 08/09/2017 - Dr Carlean Purl recc 5 yr f/u - July 2024  Past Surgical History:  Procedure Laterality Date  . COLONOSCOPY    . COLONOSCOPY W/ POLYPECTOMY  06/16/2010   diminutive adenoma, diverticulosis, hemorrhoids  . CORONARY ANGIOPLASTY WITH STENT PLACEMENT    . ESOPHAGOGASTRODUODENOSCOPY  06/16/2010   antral ulcers, esophageal stricture - dilated, duodenitis, hiatal hernia  . HERNIA REPAIR Right   . INGUINAL HERNIA REPAIR Right 11/05/2018   Procedure: OPEN RIGHT INGUINAL HERNIA REPAIR  WITH MESH;  Surgeon: Coralie Keens, MD;  Location: [place];  Service: General;  Laterality: Right;  TAP BLOCK  . PILONIDAL CYST EXCISION     teenager  . POLYPECTOMY    . UPPER GASTROINTESTINAL ENDOSCOPY     Family History  Problem Relation Age of Onset  . Colon cancer Father        deceased age 19  . Dementia Mother   . Breast cancer Neg Hx   . Colon polyps Neg Hx   . Esophageal cancer Neg Hx   . Liver cancer Neg Hx   . Pancreatic cancer Neg Hx   . Rectal cancer Neg Hx   . Stomach cancer Neg Hx    Social History   Socioeconomic History  . Marital status: Married    Spouse name: Jackelyn Poling  . Number of children: 2 boys  Occupational History  . Occupation: Psychologist, occupational: [employer]   Tobacco Use  . Smoking status: Never Smoker  . Smokeless tobacco: Never Used  Substance and Sexual Activity  . Alcohol use: Yes    Comment: approx 2 drinks per week  . Drug use: Never  . Sexual activity: Not on file  Social History Narrative   Daily caffeine    ROS Constitutional: Denies fever, chills, weight loss/gain, headaches, insomnia,  night sweats or change in appetite. Does c/o fatigue. Eyes: Denies redness, blurred vision, diplopia, discharge, itchy or watery eyes.  ENT: Denies discharge, congestion, post nasal drip, epistaxis, sore throat, earache, hearing loss, dental pain, Tinnitus, Vertigo, Sinus pain or snoring.  Cardio: Denies chest pain, palpitations, irregular heartbeat, syncope, dyspnea, diaphoresis, orthopnea, PND, claudication or edema Respiratory: denies cough, dyspnea, DOE, pleurisy, hoarseness, laryngitis or wheezing.  Gastrointestinal: Denies dysphagia, heartburn, reflux, water brash, pain, cramps, nausea, vomiting, bloating, diarrhea, constipation, hematemesis, melena, hematochezia, jaundice or hemorrhoids Genitourinary: Denies dysuria, frequency, urgency, nocturia, hesitancy, discharge, hematuria or flank pain  Musculoskeletal: Denies arthralgia, myalgia, stiffness, Jt. Swelling, pain, limp or strain/sprain. Denies Falls. Skin: Denies puritis, rash, hives, warts, acne, eczema or change in skin lesion Neuro: No weakness, tremor, incoordination, spasms, paresthesia or pain Psychiatric: Denies confusion, memory loss or sensory loss. Denies Depression. Endocrine: Denies change in weight, skin, hair change, nocturia, and paresthesia, diabetic polys, visual blurring or hyper / hypo glycemic episodes.  Heme/Lymph: No excessive bleeding, bruising or enlarged lymph nodes.  Physical Exam  BP 128/90   Pulse 88   Temp (!) 97 F (36.1 C)   Resp 16   Ht 5\' 11"  (1.803 m)   Wt 189 lb 12.8 oz (86.1 kg)   BMI 26.47 kg/m   General Appearance: Well nourished and well groomed and in no apparent distress.  Eyes: PERRLA, EOMs, conjunctiva no swelling or erythema, normal fundi and vessels. Sinuses: No frontal/maxillary tenderness ENT/Mouth: EACs patent / TMs  nl.  Nares clear without erythema, swelling, mucoid exudates. Oral hygiene is good. No erythema, swelling, or exudate. Tongue normal, non-obstructing. Tonsils not swollen or erythematous. Hearing normal.  Neck: Supple, thyroid not palpable. No bruits, nodes or JVD. Respiratory: Respiratory effort normal.  BS equal and clear bilateral without rales, rhonci, wheezing or stridor. Cardio: Heart sounds are normal with regular rate and rhythm and no murmurs, rubs or gallops. Peripheral pulses are normal and equal bilaterally without edema. No aortic or femoral bruits. Chest: symmetric with normal excursions and percussion.  Abdomen: Soft, with Nl bowel sounds. Nontender, no guarding, rebound, hernias, masses, or organomegaly.  Lymphatics: Non tender without lymphadenopathy.  Musculoskeletal: Full ROM all peripheral extremities, joint stability, 5/5 strength, and normal gait. Skin: Warm and dry without rashes, lesions, cyanosis, clubbing or  ecchymosis.  Neuro:  Cranial nerves intact, reflexes equal bilaterally. Normal muscle tone, no cerebellar symptoms. Sensation intact.  Pysch: Alert and oriented X 3 with normal affect, insight and judgment appropriate.   Assessment and Plan  1. Annual Preventative/Screening Exam   2. Essential hypertension  - EKG 12-Lead - [place], RETROPERITNL ABD,  LTD - Urinalysis, Routine w reflex microscopic - Microalbumin / creatinine urine ratio - CBC with Differential/Platelet - COMPLETE METABOLIC PANEL WITH GFR - Magnesium - TSH  3. Hyperlipidemia, mixed  - EKG 12-Lead - [place], RETROPERITNL ABD,  LTD - Lipid panel - TSH  4. Abnormal glucose  - EKG 12-Lead - [place], RETROPERITNL ABD,  LTD - Hemoglobin A1c - Insulin, random  5. Vitamin D deficiency  - VITAMIN D 25 Hydroxyl  6. Prediabetes  - EKG 12-Lead - [place], RETROPERITNL ABD,  LTD - Hemoglobin A1c - Insulin, random  7. Hypothyroidism, Hx  - TSH  8. Coronary artery disease involving native heart without angina pectoris, unspecified vessel or lesion type  - EKG 12-Lead  9. Screening examination for pulmonary tuberculosis   10. Screening for colorectal cancer   11. BPH with obstruction/lower urinary tract symptoms  - PSA  12. Prostate cancer screening  - PSA  13. Screening for ischemic heart disease  - EKG 12-Lead  14. FH: hypertension  - EKG 12-Lead - [place], RETROPERITNL ABD,  LTD  15. Screening for AAA (aortic abdominal aneurysm)  - [place], RETROPERITNL ABD,  LTD  16. Fatigue  - Iron,Total/Total Iron Binding Cap - Vitamin B12 - CBC with Differential/Platelet - TSH  17. Medication management  - Urinalysis, Routine w reflex microscopic - Microalbumin / creatinine urine ratio - CBC with Differential/Platelet - COMPLETE METABOLIC PANEL WITH GFR - Magnesium - Lipid panel - TSH - Hemoglobin A1c - Insulin, random - VITAMIN D 25 Hydroxyl  18. Chronic anxiety  - ALPRAZolam (XANAX) 0.5 MG tablet; Take 1/2 to 1 tablet at  Bedtime ONLY if Needed for Sleep & please try to limit to 5 days /week to avoid addiction  Dispense: 30 tablet; Refill: 0             Patient was counseled in prudent diet, weight control to achieve/maintain BMI less than 25, BP monitoring, regular exercise and medications as discussed.  Discussed med effects and SE's. Routine screening labs and tests as requested with regular follow-up as recommended. Over 40 minutes of exam, counseling, chart review and high complex critical decision making was performed   Kirtland Bouchard, MD

## 2018-12-12 LAB — COMPLETE METABOLIC PANEL WITH GFR
AG Ratio: 2.4 (calc) (ref 1.0–2.5)
ALT: 16 U/L (ref 9–46)
AST: 13 U/L (ref 10–35)
Albumin: 4.8 g/dL (ref 3.6–5.1)
Alkaline phosphatase (APISO): 63 U/L (ref 35–144)
BUN: 21 mg/dL (ref 7–25)
CO2: 27 mmol/L (ref 20–32)
Calcium: 10 mg/dL (ref 8.6–10.3)
Chloride: 104 mmol/L (ref 98–110)
Creat: 1.24 mg/dL (ref 0.70–1.25)
GFR, Est African American: 71 mL/min/{1.73_m2} (ref >=60)
GFR, Est Non African American: 61 mL/min/{1.73_m2} (ref >=60)
Globulin: 2 g/dL (calc) (ref 1.9–3.7)
Glucose, Bld: 127 mg/dL — ABNORMAL HIGH (ref 65–99)
Potassium: 4.2 mmol/L (ref 3.5–5.3)
Sodium: 139 mmol/L (ref 135–146)
Total Bilirubin: 0.5 mg/dL (ref 0.2–1.2)
Total Protein: 6.8 g/dL (ref 6.1–8.1)

## 2018-12-12 LAB — IRON, TOTAL/TOTAL IRON BINDING CAP
%SAT: 24 % (calc) (ref 20–48)
Iron: 83 ug/dL (ref 50–180)
TIBC: 340 mcg/dL (calc) (ref 250–425)

## 2018-12-12 LAB — CBC WITH DIFFERENTIAL/PLATELET
Absolute Monocytes: 629 cells/uL (ref 200–950)
Basophils Absolute: 52 cells/uL (ref 0–200)
Basophils Relative: 0.7 %
Eosinophils Absolute: 89 cells/uL (ref 15–500)
Eosinophils Relative: 1.2 %
HCT: 42.6 % (ref 38.5–50.0)
Hemoglobin: 14.8 g/dL (ref 13.2–17.1)
Lymphs Abs: 1561 cells/uL (ref 850–3900)
MCH: 31.2 pg (ref 27.0–33.0)
MCHC: 34.7 g/dL (ref 32.0–36.0)
MCV: 89.7 fL (ref 80.0–100.0)
MPV: 12.1 fL (ref 7.5–12.5)
Monocytes Relative: 8.5 %
Neutro Abs: 5069 cells/uL (ref 1500–7800)
Neutrophils Relative %: 68.5 %
Platelets: 274 10*3/uL (ref 140–400)
RBC: 4.75 10*6/uL (ref 4.20–5.80)
RDW: 12.8 % (ref 11.0–15.0)
Total Lymphocyte: 21.1 %
WBC: 7.4 10*3/uL (ref 3.8–10.8)

## 2018-12-12 LAB — MAGNESIUM: Magnesium: 2.1 mg/dL (ref 1.5–2.5)

## 2018-12-12 LAB — URINALYSIS, ROUTINE W REFLEX MICROSCOPIC
Bilirubin Urine: NEGATIVE
Glucose, UA: NEGATIVE
Hgb urine dipstick: NEGATIVE
Leukocytes,Ua: NEGATIVE
Nitrite: NEGATIVE
Protein, ur: NEGATIVE
Specific Gravity, Urine: 1.027 (ref 1.001–1.03)
pH: <=5 (ref 5.0–8.0)

## 2018-12-12 LAB — MICROALBUMIN / CREATININE URINE RATIO
Creatinine, Urine: 229 mg/dL (ref 20–320)
Microalb Creat Ratio: 2 mcg/mg creat (ref <30)
Microalb, Ur: 0.5 mg/dL

## 2018-12-12 LAB — LIPID PANEL
Cholesterol: 162 mg/dL (ref <200)
HDL: 45 mg/dL (ref >=40)
LDL Cholesterol (Calc): 99 mg/dL (calc)
Non-HDL Cholesterol (Calc): 117 mg/dL (calc) (ref <130)
Total CHOL/HDL Ratio: 3.6 (calc) (ref <5.0)
Triglycerides: 87 mg/dL (ref <150)

## 2018-12-12 LAB — VITAMIN B12: Vitamin B-12: 485 pg/mL (ref 200–1100)

## 2018-12-12 LAB — TSH: TSH: 2.24 mIU/L (ref 0.40–4.50)

## 2018-12-12 LAB — VITAMIN D 25 HYDROXY (VIT D DEFICIENCY, FRACTURES): Vit D, 25-Hydroxy: 61 ng/mL (ref 30–100)

## 2018-12-12 LAB — HEMOGLOBIN A1C
Hgb A1c MFr Bld: 5.7 % of total Hgb — ABNORMAL HIGH (ref <5.7)
Mean Plasma Glucose: 117 (calc)
eAG (mmol/L): 6.5 (calc)

## 2018-12-12 LAB — INSULIN, RANDOM: Insulin: 28.2 u[IU]/mL — ABNORMAL HIGH

## 2018-12-12 LAB — PSA: PSA: 0.6 ng/mL (ref <=4.0)

## 2018-12-15 ENCOUNTER — Encounter: Payer: Self-pay | Admitting: Internal Medicine

## 2018-12-15 DIAGNOSIS — Z8639 Personal history of other endocrine, nutritional and metabolic disease: Secondary | ICD-10-CM | POA: Insufficient documentation

## 2018-12-15 HISTORY — DX: Personal history of other endocrine, nutritional and metabolic disease: Z86.39

## 2019-01-11 ENCOUNTER — Encounter (HOSPITAL_COMMUNITY): Payer: Self-pay

## 2019-01-11 ENCOUNTER — Other Ambulatory Visit: Payer: Self-pay

## 2019-01-11 ENCOUNTER — Emergency Department (HOSPITAL_COMMUNITY)
Admission: EM | Admit: 2019-01-11 | Discharge: 2019-01-12 | Disposition: A | Discharge status: Home / Self Care | Payer: 59 | Attending: Emergency Medicine | Admitting: Emergency Medicine

## 2019-01-11 DIAGNOSIS — Z5321 Procedure and treatment not carried out due to patient leaving prior to being seen by health care provider: Secondary | ICD-10-CM | POA: Insufficient documentation

## 2019-01-11 DIAGNOSIS — R0789 Other chest pain: Secondary | ICD-10-CM | POA: Insufficient documentation

## 2019-01-11 LAB — TROPONIN I (HIGH SENSITIVITY): Troponin I (High Sensitivity): 3 ng/L (ref <18)

## 2019-01-11 LAB — BASIC METABOLIC PANEL
Anion gap: 9 (ref 5–15)
BUN: 22 mg/dL (ref 8–23)
CO2: 25 mmol/L (ref 22–32)
Calcium: 9.5 mg/dL (ref 8.9–10.3)
Chloride: 104 mmol/L (ref 98–111)
Creatinine, Ser: 1.07 mg/dL (ref 0.61–1.24)
GFR calc Af Amer: >60 mL/min (ref >60)
GFR calc non Af Amer: >60 mL/min (ref >60)
Glucose, Bld: 124 mg/dL — ABNORMAL HIGH (ref 70–99)
Potassium: 3.8 mmol/L (ref 3.5–5.1)
Sodium: 138 mmol/L (ref 135–145)

## 2019-01-11 LAB — CBC
HCT: 45.4 % (ref 39.0–52.0)
Hemoglobin: 15.3 g/dL (ref 13.0–17.0)
MCH: 31.4 pg (ref 26.0–34.0)
MCHC: 33.7 g/dL (ref 30.0–36.0)
MCV: 93.2 fL (ref 80.0–100.0)
Platelets: 304 10*3/uL (ref 150–400)
RBC: 4.87 MIL/uL (ref 4.22–5.81)
RDW: 13.6 % (ref 11.5–15.5)
WBC: 9.1 10*3/uL (ref 4.0–10.5)
nRBC: 0 % (ref 0.0–0.2)

## 2019-01-11 NOTE — ED Triage Notes (Signed)
Pt arrives POV for eval of L sided chest pain, Shoulderblade pain and arm pain. Pt reports hx of MI in the past and states this feels similar. Reports intermittent sx x 1 week, but reports today after lunch they were more severe and persistent. No diaphoresis, N/V.

## 2019-01-11 NOTE — ED Notes (Signed)
Pt states wife who recently had a stroke is waiting in car for him. Wife has his phone. Pt states he is unable to stay in ER. I told pt importance of staying, as EKG cannot rule out all cardiac conditions. Pt states he is aware of risk and decided to leave AMA.

## 2019-01-14 ENCOUNTER — Other Ambulatory Visit: Payer: Self-pay | Admitting: Internal Medicine

## 2019-01-14 DIAGNOSIS — F419 Anxiety disorder, unspecified: Secondary | ICD-10-CM

## 2019-02-10 ENCOUNTER — Other Ambulatory Visit: Payer: Self-pay | Admitting: Internal Medicine

## 2019-02-10 DIAGNOSIS — E782 Mixed hyperlipidemia: Secondary | ICD-10-CM

## 2019-02-21 ENCOUNTER — Other Ambulatory Visit: Payer: Self-pay | Admitting: Internal Medicine

## 2019-02-21 ENCOUNTER — Encounter: Payer: Self-pay | Admitting: Internal Medicine

## 2019-02-21 ENCOUNTER — Other Ambulatory Visit: Payer: Self-pay | Admitting: Adult Health Nurse Practitioner

## 2019-02-21 DIAGNOSIS — F419 Anxiety disorder, unspecified: Secondary | ICD-10-CM

## 2019-02-21 DIAGNOSIS — G4700 Insomnia, unspecified: Secondary | ICD-10-CM | POA: Insufficient documentation

## 2019-02-21 MED ORDER — FINASTERIDE 5 MG PO TABS: ORAL_TABLET | ORAL | 3 refills | Status: DC

## 2019-03-05 ENCOUNTER — Ambulatory Visit: Payer: PPO | Attending: Internal Medicine

## 2019-03-05 DIAGNOSIS — Z20822 Contact with and (suspected) exposure to covid-19: Secondary | ICD-10-CM

## 2019-03-06 LAB — NOVEL CORONAVIRUS, NAA: SARS-CoV-2, NAA: NOT DETECTED

## 2019-03-07 ENCOUNTER — Ambulatory Visit (INDEPENDENT_AMBULATORY_CARE_PROVIDER_SITE_OTHER): Payer: PPO | Admitting: Adult Health Nurse Practitioner

## 2019-03-07 ENCOUNTER — Other Ambulatory Visit: Payer: Self-pay

## 2019-03-07 ENCOUNTER — Encounter: Payer: Self-pay | Admitting: Adult Health Nurse Practitioner

## 2019-03-07 VITALS — BP 124/80 | HR 78 | Temp 97.6°F | Resp 16 | Ht 71.0 in | Wt 191.0 lb

## 2019-03-07 DIAGNOSIS — K219 Gastro-esophageal reflux disease without esophagitis: Secondary | ICD-10-CM | POA: Diagnosis not present

## 2019-03-07 DIAGNOSIS — I10 Essential (primary) hypertension: Secondary | ICD-10-CM

## 2019-03-07 DIAGNOSIS — R0602 Shortness of breath: Secondary | ICD-10-CM

## 2019-03-07 DIAGNOSIS — R079 Chest pain, unspecified: Secondary | ICD-10-CM | POA: Diagnosis not present

## 2019-03-07 LAB — COMPLETE METABOLIC PANEL WITH GFR
AG Ratio: 2.3 (calc) (ref 1.0–2.5)
ALT: 22 U/L (ref 9–46)
AST: 14 U/L (ref 10–35)
Albumin: 4.8 g/dL (ref 3.6–5.1)
Alkaline phosphatase (APISO): 68 U/L (ref 35–144)
BUN: 22 mg/dL (ref 7–25)
CO2: 27 mmol/L (ref 20–32)
Calcium: 10.1 mg/dL (ref 8.6–10.3)
Chloride: 105 mmol/L (ref 98–110)
Creat: 1.16 mg/dL (ref 0.70–1.25)
GFR, Est African American: 76 mL/min/{1.73_m2} (ref >=60)
GFR, Est Non African American: 66 mL/min/{1.73_m2} (ref >=60)
Globulin: 2.1 g/dL (calc) (ref 1.9–3.7)
Glucose, Bld: 100 mg/dL — ABNORMAL HIGH (ref 65–99)
Potassium: 4.9 mmol/L (ref 3.5–5.3)
Sodium: 139 mmol/L (ref 135–146)
Total Bilirubin: 0.4 mg/dL (ref 0.2–1.2)
Total Protein: 6.9 g/dL (ref 6.1–8.1)

## 2019-03-07 LAB — CBC WITH DIFFERENTIAL/PLATELET
Absolute Monocytes: 585 cells/uL (ref 200–950)
Basophils Absolute: 53 cells/uL (ref 0–200)
Basophils Relative: 0.7 %
Eosinophils Absolute: 90 cells/uL (ref 15–500)
Eosinophils Relative: 1.2 %
HCT: 44.5 % (ref 38.5–50.0)
Hemoglobin: 15 g/dL (ref 13.2–17.1)
Lymphs Abs: 1410 cells/uL (ref 850–3900)
MCH: 30.8 pg (ref 27.0–33.0)
MCHC: 33.7 g/dL (ref 32.0–36.0)
MCV: 91.4 fL (ref 80.0–100.0)
MPV: 12.2 fL (ref 7.5–12.5)
Monocytes Relative: 7.8 %
Neutro Abs: 5363 cells/uL (ref 1500–7800)
Neutrophils Relative %: 71.5 %
Platelets: 312 10*3/uL (ref 140–400)
RBC: 4.87 10*6/uL (ref 4.20–5.80)
RDW: 12.8 % (ref 11.0–15.0)
Total Lymphocyte: 18.8 %
WBC: 7.5 10*3/uL (ref 3.8–10.8)

## 2019-03-07 LAB — D-DIMER, QUANTITATIVE: D-Dimer, Quant: 0.21 mcg/mL FEU (ref <0.50)

## 2019-03-07 LAB — TROPONIN I: Troponin I: <0.01 ng/mL (ref <=0.0)

## 2019-03-07 NOTE — Progress Notes (Signed)
Assessment and Plan:  [name] was seen today for chest pain, shortness of breath  Diagnoses and all orders for this visit:  Shortness of breath -     DG Chest 2 View; Future, rule out pulmonary causes related to symptoms -     CBC with Differential/Platelet -     COMPLETE METABOLIC PANEL WITH GFR  Left-sided chest pain -     EKG 12-Lead, NSR, further rule out cardiac involvement with labs -     CBC with Differential/Platelet -     COMPLETE METABOLIC PANEL WITH GFR -     Troponin I - -     D-dimer, quantitative (not at [facility]) Discussed hospital precautions for patient MSK involvement?  Essential hypertension Continue current medications: Lisinopril 10mg  daily Monitor blood pressure at home; call if consistently over 130/80 Continue DASH diet.   Reminder to go to the ER if any CP, SOB, nausea, dizziness, severe HA, changes vision/speech, left arm numbness and tingling and jaw pain.  GERD Restart Nexium regularly to rule out Monitor symptoms Monitor for triggers and avoid Increase water intake    Discussed hospital precautions with patient and he is agree able to plan of care.  Provided printed handout with location of imaging center.  All questions answered.   Further disposition pending results of labs. Discussed med's effects and SE's.   Over 30 minutes of exam, counseling, chart review, and critical decision making was performed.   Future Appointments  Date Time Provider [place]  03/26/2019 10:00 AM Liane Comber, NP [facility] None  06/27/2019 10:30 AM Unk Pinto, MD [facility] None  01/15/2020  3:00 PM Unk Pinto, MD [facility] None    ------------------------------------------------------------------------------------------------------------------   HPI 64 y.o.male presents for evaluation of fatigue, headache and chest pain.   Reports six days ago his sister from [name] came visit and they were very active around town during this time,  she left Monday, four days ago.  Denies any strenuous or new activities.  He went to work two days ago and he went home for lunch.  Reports that afternoon he had increase in fatigue and in fact fell asleep on the floor.  He has had a headache for the past four days.  The pain is constant and dull.  Reports he took some ibuprofen for this but didn't seem to do much and he is able to sleep through the night.  He has had some muscle pains on his left chest that radiates down his left arm, 6/10 pains.  Denies any numbness or tingling.  He also feels like there is something going on with his lungs, not getting enough air.  Denies  exertional shortness of breath, cough, increased breathing, PND.  He has history of ASCAD s/o PTCA stent in 2005.  He does not follow with cariology regularly and hypertension has been well controlled on lisinopril 10mg . He has had lost 10lbs over the course of the past four months.  Reports he has checked his blood pressure at home and denies any hypotension.  Reports he stopped taking his Nexium, was out for 5 days.  He had rebound reflux and started taking this again, symptoms resolved but not sure if this was related  Also reports he does not take his atorvastatin routinely as he forgets sometimes and has started this back again, wondering if this is contributing to his symptoms.   Left side of his leg has extra pressure and feels like there is a strap on his leg.  Denies any numbness or tingling, weakness or falls.   Denies any fever, chills, change in vision, dizziness, sinus symptoms, cough, sore throat, palpitations, edema, back pain, change in bowel or bladder   Had COVID test 03/05/19 - Negative.  Past Medical History:  Diagnosis Date  . ANXIETY 12/25/2007  . ANXIETY STATE NEC 11/01/2006  . CORONARY ARTERY DISEASE 08/02/2006   off Plavix x 3 years  . DIVERTICULITIS OF COLON 01/06/2010  . DVT, HX OF 08/02/2006  . 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 Hx of adenomatous  polyp of colon 06/09/2010  . HYPERLIPIDEMIA 08/02/2006  . HYPERTENSION 08/05/2008  . Hypothyroidism 03/19/2014  . Memory loss 07/21/2009  . Pilonidal cyst    low back pain  . Recurrent inguinal hernia    right     Allergies  Allergen Reactions  . Penicillins     unspecified    Current Outpatient Medications on File Prior to Visit  Medication Sig  . ALPRAZolam (XANAX) 0.5 MG tablet Take 1/2-1 tablet at Bedtime  ONLY if needed for Sleep &  limit to 5 days /week to avoid addiction  . aspirin EC 81 MG tablet 81 mg daily.   Marland Kitchen atorvastatin (LIPITOR) 80 MG tablet Take 1 tablet Daily for Cholesterol  . Cholecalciferol (VITAMIN D) 50 MCG (2000 UT) tablet Take 2,000 Units by mouth 2 (two) times daily.  Marland Kitchen esomeprazole (NEXIUM) 20 MG capsule Take 20 mg by mouth daily at 12 noon. Takes OTC 20 mg  . finasteride (PROSCAR) 5 MG tablet Take 1 table tDaily for Prostate  . lisinopril (ZESTRIL) 10 MG tablet TAKE 1 TABLET BY MOUTH ONCE DAILY FOR BLOOD PRESSURE  . sildenafil (REVATIO) 20 MG tablet TAKE 1 TO 5 TABS BY MOUTH DAILY AS NEEDED.   No current facility-administered medications on file prior to visit.    ROS: all negative except above.   Physical Exam:  BP 124/80   Pulse 78   Temp 97.6 F (36.4 C)   Resp 16   Ht 5\' 11"  (1.803 m)   Wt 191 lb (86.6 kg)   SpO2 99%   BMI 26.64 kg/m   General Appearance: Well nourished, in no apparent distress. Eyes: PERRLA, EOMs intact, conjunctiva no swelling or erythema, no nystagmus Sinuses: No Frontal/maxillary tenderness ENT/Mouth: Ext aud canals clear, TMs without erythema, bulging. No erythema, swelling, or exudate on post pharynx.  Tonsils not swollen or erythematous. Hearing normal.  Neck: Supple, thyroid normal.  Respiratory: Respiratory effort normal, BS equal bilaterally without rales, rhonchi, wheezing or stridor.  Cardio: RRR with no MRGs. Brisk peripheral pulses without edema.  Abdomen: Soft, + BS.  Non tender, no guarding, rebound,  hernias, masses. Lymphatics: Non tender without lymphadenopathy.  Musculoskeletal: Full ROM, 5/5 strength, normal gait. Left pectoral tenderness to palpation.  No costochondral tenderness.  Left shoulder: SITS muscles non tender, Full ROM. Skin: Warm, dry without rashes, lesions, ecchymosis. Point tenderness to left midline 2nd-3rd intercostal space.  Neuro: Cranial nerves intact. Normal muscle tone, no cerebellar symptoms. Sensation intact.  Psych: Awake and oriented X 3, normal affect, Insight and Judgment appropriate.    EKG: NSR   Garnet Sierras, NP 10:52 AM [facility]

## 2019-03-26 ENCOUNTER — Ambulatory Visit: Payer: PPO | Admitting: Adult Health

## 2019-03-27 ENCOUNTER — Encounter: Payer: Self-pay | Admitting: Adult Health

## 2019-03-27 DIAGNOSIS — I700 Atherosclerosis of aorta: Secondary | ICD-10-CM | POA: Insufficient documentation

## 2019-03-27 NOTE — Progress Notes (Deleted)
MEDICARE ANNUAL WELLNESS VISIT AND FOLLOW UP Assessment:   Diagnoses and all orders for this visit:  Welcome to Medicare preventive visit  Aortic atherosclerosis ([place])  Atherosclerosis of coronary artery bypass graft of native heart without angina pectoris  Essential hypertension  Gastroesophageal reflux disease, unspecified whether esophagitis present  Vitamin D deficiency  Overweight (BMI 25.0-29.9)  Medication management  Insomnia, unspecified type  Hyperlipidemia, mixed  Hx of adenomatous polyp of colon  Elevated PSA  DVT, HX OF  Abnormal glucose      Over 30 minutes of exam, counseling, chart review, and critical decision making was performed  Future Appointments  Date Time Provider [place]  03/28/2019 11:15 AM Liane Comber, NP [facility] None  06/27/2019 10:30 AM Unk Pinto, MD [facility] None  01/15/2020  3:00 PM Unk Pinto, MD [facility] None     Plan:   During the course of the visit the patient was educated and counseled about appropriate screening and preventive services including:    Pneumococcal vaccine   Influenza vaccine  Prevnar 13  Td vaccine  Screening electrocardiogram  Colorectal cancer screening  Diabetes screening  Glaucoma screening  Nutrition counseling    Subjective:  [name] is a 64 y.o. male who presents for Medicare Annual Wellness Visit and 3 month follow up for HTN, hyperlipidemia, prediabetes, and vitamin D Def.   he has a diagnosis of GERD which is currently managed by nexium 20 mg daily *** he reports symptoms {ACTION; ARE/ARE [id number] currently well controlled, and denies breakthrough reflux, burning in chest, hoarseness or cough.    He has dx of insomnia, prescribed xanax   Hx of DVT ***  BMI is There is no height or weight on file to calculate BMI., he {HAS HAS [id number] been working on diet and exercise. Wt Readings from Last 3 Encounters:  03/07/19 191 lb (86.6  kg)  01/11/19 186 lb (84.4 kg)  12/11/18 189 lb 12.8 oz (86.1 kg)   In 2008 , he had ACS and had PCA/Stents by Dr Dorris Carnes. He had a Negative Cardiolite in 2015. He has abdominal aortic atherosclerosis per CT 09/2018.  His blood pressure {HAS HAS NOT:18834} been controlled at home, today their BP is   He {DOES_DOES [id number] workout. He denies chest pain, shortness of breath, dizziness.   He is on cholesterol medication (atorvastatin 40 mg daily) and denies myalgias. His cholesterol is at goal. The cholesterol last visit was:   Lab Results  Component Value Date   CHOL 162 12/11/2018   HDL 45 12/11/2018   LDLCALC 99 12/11/2018   LDLDIRECT 190.6 07/30/2010   TRIG 87 12/11/2018   CHOLHDL 3.6 12/11/2018   He {Has/has not:18111} been working on diet and exercise for prediabetes predating 2014, and denies {Symptoms; diabetes w/o none:19199}. Last A1C in the office was:  Lab Results  Component Value Date   HGBA1C 5.7 (H) 12/11/2018   Last GFR Lab Results  Component Value Date   GFRNONAA 66 03/07/2019   Patient is on Vitamin D supplement and at goal at recent check   Lab Results  Component Value Date   VD25OH 61 12/11/2018     Hx of PSA elevation, now on finasteride with low recent values. He is also on sildenafil *** Lab Results  Component Value Date   PSA 0.6 12/11/2018   PSA 1.1 10/23/2017   PSA 2.4 09/16/2016      Medication Review:   Current Outpatient Medications (Cardiovascular):  .  atorvastatin (LIPITOR) 80  MG tablet, Take 1 tablet Daily for Cholesterol .  lisinopril (ZESTRIL) 10 MG tablet, TAKE 1 TABLET BY MOUTH ONCE DAILY FOR BLOOD PRESSURE .  sildenafil (REVATIO) 20 MG tablet, TAKE 1 TO 5 TABS BY MOUTH DAILY AS NEEDED.   Current Outpatient Medications (Analgesics):  .  aspirin EC 81 MG tablet, 81 mg daily.    Current Outpatient Medications (Other):  Marland Kitchen  ALPRAZolam (XANAX) 0.5 MG tablet, Take 1/2-1 tablet at Bedtime  ONLY if needed for Sleep &  limit to 5  days /week to avoid addiction .  Cholecalciferol (VITAMIN D) 50 MCG (2000 UT) tablet, Take 2,000 Units by mouth 2 (two) times daily. Marland Kitchen  esomeprazole (NEXIUM) 20 MG capsule, Take 20 mg by mouth daily at 12 noon. Takes OTC 20 mg .  finasteride (PROSCAR) 5 MG tablet, Take 1 table tDaily for Prostate  Allergies: Allergies  Allergen Reactions  . Penicillins     unspecified    Current Problems (verified) has Hyperlipidemia, mixed; Essential hypertension; ASCAD s/p PTCA/Stent (2005) ; Gastroesophageal reflux disease; DVT, HX OF; Abnormal glucose; Vitamin D deficiency; Medication management; Elevated PSA; Overweight (BMI 25.0-29.9); Hx of adenomatous polyp of colon; and Insomnia on their problem list.  Screening Tests Immunization History  Administered Date(s) Administered  . Influenza,inj,Quad PF,6+ Mos 12/07/2016, 11/21/2018  . Influenza-Unspecified 01/28/2018  . PPD Test 07/24/2013, 07/30/2014, 09/16/2016, 10/23/2017  . Tdap 07/30/2010    Preventative care: Last colonoscopy: 08/2017, polyps, recall 2024 per Dr. Carlean Purl  CT abd: 09/2018, diverticulosis, abdominal aortic atherosclerosis Myocardial perfusion: 2015  Prior vaccinations: TD or Tdap: 2012  Influenza: 11/2018  Pneumococcal: *** Prevnar13:  Shingles/Zostavax: ***  Names of Other Physician/Practitioners you currently use: 1. [facility] here for primary care 2. ***, eye doctor, last visit  3. ***, dentist, last visit   Patient Care Team: Unk Pinto, MD as PCP - General (Internal Medicine)  Surgical: He  has a past surgical history that includes Coronary angioplasty with stent; Pilonidal cyst excision; Esophagogastroduodenoscopy (06/16/2010); Colonoscopy w/ polypectomy (06/16/2010); Colonoscopy; Polypectomy; Upper gastrointestinal endoscopy; Hernia repair (Right); and Inguinal hernia repair (Right, 11/05/2018). Family His family history includes Colon cancer in his father; Dementia  in his mother. Social history  He reports that he has never smoked. He has never used smokeless tobacco. He reports current alcohol use. He reports that he does not use drugs.  MEDICARE WELLNESS OBJECTIVES: Physical activity:   Cardiac risk factors:   Depression/mood screen:   Depression screen [facility] 2/9 05/08/2018  Decreased Interest 0  Down, Depressed, Hopeless 0  PHQ - 2 Score 0  Altered sleeping -  Tired, decreased energy -  Change in appetite -  Feeling bad or failure about yourself  -  Trouble concentrating -  Moving slowly or fidgety/restless -  Suicidal thoughts -  PHQ-9 Score -    ADLs:  In your present state of health, do you have any difficulty performing the following activities: 11/05/2018 05/08/2018  Hearing? N N  Vision? N N  Difficulty concentrating or making decisions? N N  Walking or climbing stairs? N N  Dressing or bathing? N N  Doing errands, shopping? - N  Some recent data might be hidden     Cognitive Testing  Alert? Yes  Normal Appearance?Yes  Oriented to person? Yes  Place? Yes   Time? Yes  Recall of three objects?  Yes  Can perform simple calculations? Yes  Displays appropriate judgment?Yes  Can read the correct time from a  watch face?Yes  EOL planning:     Objective:   There were no vitals filed for this visit. There is no height or weight on file to calculate BMI.  General appearance: alert, no distress, WD/WN, male HEENT: normocephalic, sclerae anicteric, TMs pearly, nares patent, no discharge or erythema, pharynx normal Oral cavity: MMM, no lesions Neck: supple, no lymphadenopathy, no thyromegaly, no masses Heart: RRR, normal S1, S2, no murmurs Lungs: CTA bilaterally, no wheezes, rhonchi, or rales Abdomen: +bs, soft, non tender, non distended, no masses, no hepatomegaly, no splenomegaly Musculoskeletal: nontender, no swelling, no obvious deformity Extremities: no edema, no cyanosis, no clubbing Pulses: 2+ symmetric, upper and lower  extremities, normal cap refill Neurological: alert, oriented x 3, CN2-12 intact, strength normal upper extremities and lower extremities, sensation normal throughout, DTRs 2+ throughout, no cerebellar signs, gait normal Psychiatric: normal affect, behavior normal, pleasant   EKG:  AAA [place]: ***  Medicare Attestation I have personally reviewed: The patient's medical and social history Their use of alcohol, tobacco or illicit drugs Their current medications and supplements The patient's functional ability including ADLs,fall risks, home safety risks, cognitive, and hearing and visual impairment Diet and physical activities Evidence for depression or mood disorders  The patient's weight, height, BMI, and visual acuity have been recorded in the chart.  I have made referrals, counseling, and provided education to the patient based on review of the above and I have provided the patient with a written personalized care plan for preventive services.     Izora Ribas, NP   03/27/2019

## 2019-03-28 ENCOUNTER — Ambulatory Visit: Payer: PPO | Admitting: Adult Health

## 2019-04-01 NOTE — Progress Notes (Deleted)
MEDICARE ANNUAL WELLNESS VISIT AND FOLLOW UP Assessment:   Diagnoses and all orders for this visit:  Welcome to Medicare preventive visit  Essential hypertension  Atherosclerosis of coronary artery bypass graft of native heart without angina pectoris  Aortic atherosclerosis ([facility])  Gastroesophageal reflux disease, unspecified whether esophagitis present  Vitamin D deficiency  Overweight (BMI 25.0-29.9)  Medication management  Insomnia, unspecified type  Hyperlipidemia, mixed  Hx of adenomatous polyp of colon  Elevated PSA  DVT, HX OF  Abnormal glucose      Over 30 minutes of exam, counseling, chart review, and critical decision making was performed  Future Appointments  Date Time Provider [place]  04/02/2019 11:30 AM Liane Comber, NP [facility] None  06/27/2019 10:30 AM Unk Pinto, MD [facility] None  01/15/2020  3:00 PM Unk Pinto, MD [facility] None     Plan:   During the course of the visit the patient was educated and counseled about appropriate screening and preventive services including:    Pneumococcal vaccine   Influenza vaccine  Prevnar 13  Td vaccine  Screening electrocardiogram  Colorectal cancer screening  Diabetes screening  Glaucoma screening  Nutrition counseling    Subjective:  [name] is a 64 y.o. male who presents for Medicare Annual Wellness Visit and 3 month follow up for HTN, hyperlipidemia, prediabetes, and vitamin D Def.   he has a diagnosis of GERD which is currently managed by nexium 20 mg daily *** he reports symptoms {ACTION; ARE/ARE [id number] currently well controlled, and denies breakthrough reflux, burning in chest, hoarseness or cough.    He has dx of insomnia, prescribed xanax   Hx of DVT ***  BMI is There is no height or weight on file to calculate BMI., he {HAS HAS [id number] been working on diet and exercise. Wt Readings from Last 3 Encounters:  03/07/19 191 lb (86.6  kg)  01/11/19 186 lb (84.4 kg)  12/11/18 189 lb 12.8 oz (86.1 kg)   In 2008 , he had ACS and had PCA/Stents by Dr Dorris Carnes. He had a Negative Cardiolite in 2015. He has abdominal aortic atherosclerosis per CT 09/2018.  His blood pressure {HAS HAS NOT:18834} been controlled at home, today their BP is   He {DOES_DOES NF:[id number] workout. He denies chest pain, shortness of breath, dizziness.   He is on cholesterol medication (atorvastatin 40 mg daily) and denies myalgias. His cholesterol is at goal. The cholesterol last visit was:   Lab Results  Component Value Date   CHOL 162 12/11/2018   HDL 45 12/11/2018   LDLCALC 99 12/11/2018   LDLDIRECT 190.6 07/30/2010   TRIG 87 12/11/2018   CHOLHDL 3.6 12/11/2018   He {Has/has not:18111} been working on diet and exercise for prediabetes predating 2014, and denies {Symptoms; diabetes w/o none:19199}. Last A1C in the office was:  Lab Results  Component Value Date   HGBA1C 5.7 (H) 12/11/2018   Last GFR Lab Results  Component Value Date   GFRNONAA 66 03/07/2019   Patient is on Vitamin D supplement and at goal at recent check   Lab Results  Component Value Date   VD25OH 61 12/11/2018     Hx of PSA elevation, now on finasteride with low recent values. He is also on sildenafil *** Lab Results  Component Value Date   PSA 0.6 12/11/2018   PSA 1.1 10/23/2017   PSA 2.4 09/16/2016      Medication Review:   Current Outpatient Medications (Cardiovascular):  .  atorvastatin (LIPITOR) 80  MG tablet, Take 1 tablet Daily for Cholesterol .  lisinopril (ZESTRIL) 10 MG tablet, TAKE 1 TABLET BY MOUTH ONCE DAILY FOR BLOOD PRESSURE .  sildenafil (REVATIO) 20 MG tablet, TAKE 1 TO 5 TABS BY MOUTH DAILY AS NEEDED.   Current Outpatient Medications (Analgesics):  .  aspirin EC 81 MG tablet, 81 mg daily.    Current Outpatient Medications (Other):  Marland Kitchen  ALPRAZolam (XANAX) 0.5 MG tablet, Take 1/2-1 tablet at Bedtime  ONLY if needed for Sleep &  limit to 5  days /week to avoid addiction .  Cholecalciferol (VITAMIN D) 50 MCG (2000 UT) tablet, Take 2,000 Units by mouth 2 (two) times daily. Marland Kitchen  esomeprazole (NEXIUM) 20 MG capsule, Take 20 mg by mouth daily at 12 noon. Takes OTC 20 mg .  finasteride (PROSCAR) 5 MG tablet, Take 1 table tDaily for Prostate  Allergies: Allergies  Allergen Reactions  . Penicillins     unspecified    Current Problems (verified) has Hyperlipidemia, mixed; Essential hypertension; ASCAD s/p PTCA/Stent (2005) ; Gastroesophageal reflux disease; DVT, HX OF; Abnormal glucose; Vitamin D deficiency; Medication management; Elevated PSA; Overweight (BMI 25.0-29.9); Hx of adenomatous polyp of colon; Insomnia; and Aortic atherosclerosis ([facility]) on their problem list.  Screening Tests Immunization History  Administered Date(s) Administered  . Influenza,inj,Quad PF,6+ Mos 12/07/2016, 11/21/2018  . Influenza-Unspecified 01/28/2018  . PPD Test 07/24/2013, 07/30/2014, 09/16/2016, 10/23/2017  . Tdap 07/30/2010    Preventative care: Last colonoscopy: 08/2017, polyps, recall 2024 per Dr. Carlean Purl  CT abd: 09/2018, diverticulosis, abdominal aortic atherosclerosis Myocardial perfusion: 2015  Prior vaccinations: TD or Tdap: 2012  Influenza: 11/2018  Pneumococcal: *** Prevnar13:  Shingles/Zostavax: ***  Names of Other Physician/Practitioners you currently use: 1. [facility] here for primary care 2. ***, eye doctor, last visit  3. ***, dentist, last visit   Patient Care Team: Unk Pinto, MD as PCP - General (Internal Medicine)  Surgical: He  has a past surgical history that includes Coronary angioplasty with stent; Pilonidal cyst excision; Esophagogastroduodenoscopy (06/16/2010); Colonoscopy w/ polypectomy (06/16/2010); Colonoscopy; Polypectomy; Upper gastrointestinal endoscopy; Hernia repair (Right); and Inguinal hernia repair (Right, 11/05/2018). Family His family history includes Colon  cancer in his father; Dementia in his mother. Social history  He reports that he has never smoked. He has never used smokeless tobacco. He reports current alcohol use. He reports that he does not use drugs.  MEDICARE WELLNESS OBJECTIVES: Physical activity:   Cardiac risk factors:   Depression/mood screen:   Depression screen [facility] 2/9 05/08/2018  Decreased Interest 0  Down, Depressed, Hopeless 0  PHQ - 2 Score 0  Altered sleeping -  Tired, decreased energy -  Change in appetite -  Feeling bad or failure about yourself  -  Trouble concentrating -  Moving slowly or fidgety/restless -  Suicidal thoughts -  PHQ-9 Score -    ADLs:  In your present state of health, do you have any difficulty performing the following activities: 11/05/2018 05/08/2018  Hearing? N N  Vision? N N  Difficulty concentrating or making decisions? N N  Walking or climbing stairs? N N  Dressing or bathing? N N  Doing errands, shopping? - N  Some recent data might be hidden     Cognitive Testing  Alert? Yes  Normal Appearance?Yes  Oriented to person? Yes  Place? Yes   Time? Yes  Recall of three objects?  Yes  Can perform simple calculations? Yes  Displays appropriate judgment?Yes  Can read the correct  time from a watch face?Yes  EOL planning:     Objective:   There were no vitals filed for this visit. There is no height or weight on file to calculate BMI.  General appearance: alert, no distress, WD/WN, male HEENT: normocephalic, sclerae anicteric, TMs pearly, nares patent, no discharge or erythema, pharynx normal Oral cavity: MMM, no lesions Neck: supple, no lymphadenopathy, no thyromegaly, no masses Heart: RRR, normal S1, S2, no murmurs Lungs: CTA bilaterally, no wheezes, rhonchi, or rales Abdomen: +bs, soft, non tender, non distended, no masses, no hepatomegaly, no splenomegaly Musculoskeletal: nontender, no swelling, no obvious deformity Extremities: no edema, no cyanosis, no clubbing Pulses:  2+ symmetric, upper and lower extremities, normal cap refill Neurological: alert, oriented x 3, CN2-12 intact, strength normal upper extremities and lower extremities, sensation normal throughout, DTRs 2+ throughout, no cerebellar signs, gait normal Psychiatric: normal affect, behavior normal, pleasant   EKG:  AAA [place]: ***  Medicare Attestation I have personally reviewed: The patient's medical and social history Their use of alcohol, tobacco or illicit drugs Their current medications and supplements The patient's functional ability including ADLs,fall risks, home safety risks, cognitive, and hearing and visual impairment Diet and physical activities Evidence for depression or mood disorders  The patient's weight, height, BMI, and visual acuity have been recorded in the chart.  I have made referrals, counseling, and provided education to the patient based on review of the above and I have provided the patient with a written personalized care plan for preventive services.     Izora Ribas, NP   04/01/2019

## 2019-04-02 ENCOUNTER — Ambulatory Visit: Payer: PPO | Admitting: Adult Health

## 2019-04-22 ENCOUNTER — Other Ambulatory Visit: Payer: Self-pay | Admitting: Adult Health

## 2019-04-30 ENCOUNTER — Other Ambulatory Visit: Payer: Self-pay | Admitting: Internal Medicine

## 2019-04-30 DIAGNOSIS — F419 Anxiety disorder, unspecified: Secondary | ICD-10-CM

## 2019-06-27 ENCOUNTER — Ambulatory Visit: Payer: PPO | Admitting: Internal Medicine

## 2019-07-02 ENCOUNTER — Other Ambulatory Visit: Payer: Self-pay | Admitting: Internal Medicine

## 2019-07-02 DIAGNOSIS — F419 Anxiety disorder, unspecified: Secondary | ICD-10-CM

## 2019-07-24 ENCOUNTER — Encounter: Payer: Self-pay | Admitting: Internal Medicine

## 2019-07-24 ENCOUNTER — Ambulatory Visit (INDEPENDENT_AMBULATORY_CARE_PROVIDER_SITE_OTHER): Payer: PPO | Admitting: Internal Medicine

## 2019-07-24 ENCOUNTER — Other Ambulatory Visit: Payer: Self-pay

## 2019-07-24 VITALS — BP 110/76 | HR 64 | Temp 97.2°F | Resp 16 | Ht 71.0 in | Wt 197.6 lb

## 2019-07-24 DIAGNOSIS — K219 Gastro-esophageal reflux disease without esophagitis: Secondary | ICD-10-CM | POA: Diagnosis not present

## 2019-07-24 DIAGNOSIS — Z79899 Other long term (current) drug therapy: Secondary | ICD-10-CM

## 2019-07-24 DIAGNOSIS — R7309 Other abnormal glucose: Secondary | ICD-10-CM | POA: Diagnosis not present

## 2019-07-24 DIAGNOSIS — E782 Mixed hyperlipidemia: Secondary | ICD-10-CM

## 2019-07-24 DIAGNOSIS — E559 Vitamin D deficiency, unspecified: Secondary | ICD-10-CM

## 2019-07-24 DIAGNOSIS — I10 Essential (primary) hypertension: Secondary | ICD-10-CM | POA: Diagnosis not present

## 2019-07-24 DIAGNOSIS — E039 Hypothyroidism, unspecified: Secondary | ICD-10-CM

## 2019-07-24 NOTE — Progress Notes (Signed)
History of Present Illness:       This very nice 64 y.o. MWM   presents for 6 month follow up with HTN, HLD, Pre-Diabetes and Vitamin D Deficiency. Patient's GERD is controlled on his meds.       Patient is treated for HTN (2010) & BP has been controlled at home. Negative Cardiolite in 2015. Today's BP is at goal - 110/76. Patient has had no complaints of any cardiac type chest pain, palpitations, dyspnea / orthopnea / PND, dizziness, claudication, or dependent edema.      Hyperlipidemia is controlled with diet & Atorvastatin. Patient denies myalgias or other med SE's. Last Lipids were at goal:  Lab Results  Component Value Date   CHOL 162 12/11/2018   HDL 45 12/11/2018   LDLCALC 99 12/11/2018   LDLDIRECT 190.6 07/30/2010   TRIG 87 12/11/2018   CHOLHDL 3.6 12/11/2018    Also, the patient has history of PreDiabetes  (A1c6.1% / 2014&6.3% / 2015)  and has had no symptoms of reactive hypoglycemia, diabetic polys, paresthesias or visual blurring.  Last A1c was near goal:  Lab Results  Component Value Date   HGBA1C 5.7 (H) 12/11/2018           Further, the patient also has history of Vitamin D Deficiency  ("43" / 2014 & "30" / 2016)   and supplements vitamin D without any suspected side-effects. Last vitamin D was at goal:  Lab Results  Component Value Date   VD25OH 61 12/11/2018    Current Outpatient Medications on File Prior to Visit  Medication Sig  . ALPRAZolam (XANAX) 0.5 MG tablet 1/2-1 TAB AT BEDTIME ONLY IF NEEDED FOR SLEEP. TRY LIMIT TO 5 DAYS/WEEK TO AVOID ADDICTION/DEMENTIA  . aspirin EC 81 MG tablet 81 mg daily.   Marland Kitchen atorvastatin (LIPITOR) 80 MG tablet Take 1 tablet Daily for Cholesterol  . Cholecalciferol (VITAMIN D) 50 MCG (2000 UT) tablet Take 2,000 Units by mouth 2 (two) times daily.  Marland Kitchen esomeprazole (NEXIUM) 20 MG capsule Take 20 mg by mouth daily at 12 noon. Takes OTC 20 mg  . finasteride (PROSCAR) 5 MG tablet Take 1 table tDaily for Prostate  . lisinopril  (ZESTRIL) 10 MG tablet TAKE 1 TABLET BY MOUTH EVERY DAY FOR BLOOD PRESSURE  . sildenafil (REVATIO) 20 MG tablet TAKE 1 TO 5 TABS BY MOUTH DAILY AS NEEDED.   No current facility-administered medications on file prior to visit.    Allergies  Allergen Reactions  . Penicillins     unspecified   PMHx:   Past Medical History:  Diagnosis Date  . ANXIETY 12/25/2007  . ANXIETY STATE NEC 11/01/2006  . CORONARY ARTERY DISEASE 08/02/2006   off Plavix x 3 years  . DIVERTICULITIS OF COLON 01/06/2010  . DVT, HX OF 08/02/2006  . 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 History of thyroiditis 12/15/2018  . Hx of adenomatous polyp of colon 06/09/2010  . HYPERLIPIDEMIA 08/02/2006  . HYPERTENSION 08/05/2008  . Hypothyroidism 03/19/2014  . Memory loss 07/21/2009  . Pilonidal cyst    low back pain  . Prostatitis 09/16/2015  . Recurrent inguinal hernia    right    Immunization History  Administered Date(s) Administered  . Influenza,inj,Quad PF,6+ Mos 12/07/2016, 11/21/2018  . Influenza-Unspecified 01/28/2018  . PPD Test 07/24/2013, 07/30/2014, 09/16/2016, 10/23/2017  . Tdap 07/30/2010    Past Surgical History:  Procedure Laterality Date  . COLONOSCOPY    . COLONOSCOPY W/ POLYPECTOMY  06/16/2010   diminutive adenoma, diverticulosis,  hemorrhoids  . CORONARY ANGIOPLASTY WITH STENT PLACEMENT    . ESOPHAGOGASTRODUODENOSCOPY  06/16/2010   antral ulcers, esophageal stricture - dilated, duodenitis, hiatal hernia  . HERNIA REPAIR Right   . INGUINAL HERNIA REPAIR Right 11/05/2018   Procedure: OPEN RIGHT INGUINAL HERNIA REPAIR WITH MESH;  Surgeon: Coralie Keens, MD;  Location: [facility];  Service: General;  Laterality: Right;  TAP BLOCK  . PILONIDAL CYST EXCISION     teenager  . POLYPECTOMY    . UPPER GASTROINTESTINAL ENDOSCOPY      FHx:    Reviewed / unchanged  SHx:    Reviewed / unchanged   Systems Review:  Constitutional: Denies fever, chills, wt changes, headaches, insomnia, fatigue,  night sweats, change in appetite. Eyes: Denies redness, blurred vision, diplopia, discharge, itchy, watery eyes.  ENT: Denies discharge, congestion, post nasal drip, epistaxis, sore throat, earache, hearing loss, dental pain, tinnitus, vertigo, sinus pain, snoring.  CV: Denies chest pain, palpitations, irregular heartbeat, syncope, dyspnea, diaphoresis, orthopnea, PND, claudication or edema. Respiratory: denies cough, dyspnea, DOE, pleurisy, hoarseness, laryngitis, wheezing.  Gastrointestinal: Denies dysphagia, odynophagia, heartburn, reflux, water brash, abdominal pain or cramps, nausea, vomiting, bloating, diarrhea, constipation, hematemesis, melena, hematochezia  or hemorrhoids. Genitourinary: Denies dysuria, frequency, urgency, nocturia, hesitancy, discharge, hematuria or flank pain. Musculoskeletal: Denies arthralgias, myalgias, stiffness, jt. swelling, pain, limping or strain/sprain.  Skin: Denies pruritus, rash, hives, warts, acne, eczema or change in skin lesion(s). Neuro: No weakness, tremor, incoordination, spasms, paresthesia or pain. Psychiatric: Denies confusion, memory loss or sensory loss. Endo: Denies change in weight, skin or hair change.  Heme/Lymph: No excessive bleeding, bruising or enlarged lymph nodes.  Physical Exam  BP 110/76   Pulse 64   Temp (!) 97.2 F (36.2 C)   Resp 16   Ht 5\' 11"  (1.803 m)   Wt 197 lb 9.6 oz (89.6 kg)   BMI 27.56 kg/m   Appears  well nourished, well groomed  and in no distress.  Eyes: PERRLA, EOMs, conjunctiva no swelling or erythema. Sinuses: No frontal/maxillary tenderness ENT/Mouth: EAC's clear, TM's nl w/o erythema, bulging. Nares clear w/o erythema, swelling, exudates. Oropharynx clear without erythema or exudates. Oral hygiene is good. Tongue normal, non obstructing. Hearing intact.  Neck: Supple. Thyroid not palpable. Car 2+/2+ without bruits, nodes or JVD. Chest: Respirations nl with BS clear & equal w/o rales, rhonchi, wheezing  or stridor.  Cor: Heart sounds normal w/ regular rate and rhythm without sig. murmurs, gallops, clicks or rubs. Peripheral pulses normal and equal  without edema.  Abdomen: Soft & bowel sounds normal. Non-tender w/o guarding, rebound, hernias, masses or organomegaly.  Lymphatics: Unremarkable.  Musculoskeletal: Full ROM all peripheral extremities, joint stability, 5/5 strength and normal gait.  Skin: Warm, dry without exposed rashes, lesions or ecchymosis apparent.  Neuro: Cranial nerves intact, reflexes equal bilaterally. Sensory-motor testing grossly intact. Tendon reflexes grossly intact.  Pysch: Alert & oriented x 3.  Insight and judgement nl & appropriate. No ideations.  Assessment and Plan:  1. Essential hypertension  - Continue medication, monitor blood pressure at home.  - Continue DASH diet.  Reminder to go to the ER if any CP,  SOB, nausea, dizziness, severe HA, changes vision/speech.  - CBC with Differential/Platelet - COMPLETE METABOLIC PANEL WITH GFR - Magnesium - TSH  2. Hyperlipidemia, mixed  - Continue diet/meds, exercise,& lifestyle modifications.  - Continue monitor periodic cholesterol/liver & renal functions   - Lipid panel - TSH  3. Abnormal glucose  - Continue diet, exercise  -  Lifestyle modifications.  - Monitor appropriate labs.  - Hemoglobin A1c - Insulin, random  4. Vitamin D deficiency  - Continue supplementation.  - VITAMIN D 25 Hydroxy  5. Gastroesophageal reflux disease   6. Hypothyroidism  - TSH  7. Medication management  - CBC with Differential/Platelet - COMPLETE METABOLIC PANEL WITH GFR - Magnesium - Lipid panel - TSH - Hemoglobin A1c - Insulin, random - VITAMIN D 25 Hydroxy        Discussed  regular exercise, BP monitoring, weight control to achieve/maintain BMI less than 25 and discussed med and SE's. Recommended labs to assess and monitor clinical status with further disposition pending results of labs.  I discussed  the assessment and treatment plan with the patient. The patient was provided an opportunity to ask questions and all were answered. The patient agreed with the plan and demonstrated an understanding of the instructions.  I provided over 30 minutes of exam, counseling, chart review and  complex critical decision making.         The patient was advised to call back or seek an in-person evaluation if the symptoms worsen or if the condition fails to improve as anticipated.   Kirtland Bouchard, MD

## 2019-07-24 NOTE — Patient Instructions (Signed)

## 2019-07-25 LAB — CBC WITH DIFFERENTIAL/PLATELET
Absolute Monocytes: 530 cells/uL (ref 200–950)
Basophils Absolute: 41 cells/uL (ref 0–200)
Basophils Relative: 0.6 %
Eosinophils Absolute: 184 cells/uL (ref 15–500)
Eosinophils Relative: 2.7 %
HCT: 41.6 % (ref 38.5–50.0)
Hemoglobin: 14.2 g/dL (ref 13.2–17.1)
Lymphs Abs: 1673 cells/uL (ref 850–3900)
MCH: 31.3 pg (ref 27.0–33.0)
MCHC: 34.1 g/dL (ref 32.0–36.0)
MCV: 91.8 fL (ref 80.0–100.0)
MPV: 12.2 fL (ref 7.5–12.5)
Monocytes Relative: 7.8 %
Neutro Abs: 4372 cells/uL (ref 1500–7800)
Neutrophils Relative %: 64.3 %
Platelets: 252 10*3/uL (ref 140–400)
RBC: 4.53 10*6/uL (ref 4.20–5.80)
RDW: 13.2 % (ref 11.0–15.0)
Total Lymphocyte: 24.6 %
WBC: 6.8 10*3/uL (ref 3.8–10.8)

## 2019-07-25 LAB — COMPLETE METABOLIC PANEL WITH GFR
AG Ratio: 2 (calc) (ref 1.0–2.5)
ALT: 15 U/L (ref 9–46)
AST: 11 U/L (ref 10–35)
Albumin: 4.2 g/dL (ref 3.6–5.1)
Alkaline phosphatase (APISO): 57 U/L (ref 35–144)
BUN: 21 mg/dL (ref 7–25)
CO2: 26 mmol/L (ref 20–32)
Calcium: 9.5 mg/dL (ref 8.6–10.3)
Chloride: 108 mmol/L (ref 98–110)
Creat: 1.21 mg/dL (ref 0.70–1.25)
GFR, Est African American: 72 mL/min/{1.73_m2} (ref >=60)
GFR, Est Non African American: 62 mL/min/{1.73_m2} (ref >=60)
Globulin: 2.1 g/dL (calc) (ref 1.9–3.7)
Glucose, Bld: 97 mg/dL (ref 65–99)
Potassium: 4.4 mmol/L (ref 3.5–5.3)
Sodium: 140 mmol/L (ref 135–146)
Total Bilirubin: 0.5 mg/dL (ref 0.2–1.2)
Total Protein: 6.3 g/dL (ref 6.1–8.1)

## 2019-07-25 LAB — HEMOGLOBIN A1C
Hgb A1c MFr Bld: 5.5 % of total Hgb (ref <5.7)
Mean Plasma Glucose: 111 (calc)
eAG (mmol/L): 6.2 (calc)

## 2019-07-25 LAB — LIPID PANEL
Cholesterol: 137 mg/dL (ref <200)
HDL: 43 mg/dL (ref >=40)
LDL Cholesterol (Calc): 76 mg/dL (calc)
Non-HDL Cholesterol (Calc): 94 mg/dL (calc) (ref <130)
Total CHOL/HDL Ratio: 3.2 (calc) (ref <5.0)
Triglycerides: 98 mg/dL (ref <150)

## 2019-07-25 LAB — VITAMIN D 25 HYDROXY (VIT D DEFICIENCY, FRACTURES): Vit D, 25-Hydroxy: 42 ng/mL (ref 30–100)

## 2019-07-25 LAB — INSULIN, RANDOM: Insulin: 13.9 u[IU]/mL

## 2019-07-25 LAB — TSH: TSH: 2.9 mIU/L (ref 0.40–4.50)

## 2019-07-25 LAB — MAGNESIUM: Magnesium: 2 mg/dL (ref 1.5–2.5)

## 2019-07-25 NOTE — Progress Notes (Signed)
===========================================================  -    Total Chol = 137 and LDL Chol = 76 - Both -Excellent   - Very low risk for Heart Attack  / Stroke ===========================================================  -  A1c = 5.5% - & finally back in Normal NonDiabetic Range - Great ===========================================================  -  Vitamin D = 42 - Low   - Vitamin D goal is between 70-100.   - Please INCREASE your Vitamin D  up to 10,000 units Daily   - It is very important as a natural anti-inflammatory and helping the  immune system protect against viral infections, like the Covid-19    helping hair, skin, and nails, as well as reducing stroke and heart attack risk.   - It helps your bones and helps with mood.  - It also decreases numerous cancer risks so please take it as directed.   - Low Vit D is associated with a 200-300% higher risk for CANCER   and 200-300% higher risk for HEART   ATTACK  &  STROKE.    - It is also associated with higher death rate at younger ages,   autoimmune diseases like Rheumatoid arthritis, Lupus, Multiple Sclerosis.     - Also many other serious conditions, like depression, Alzheimer's  Dementia, infertility, muscle aches, fatigue, fibromyalgia - just to name a few.  =======================================================  - FYI - Xanax Rx for sleep not due until 6/25  - (Would you like to try something else for Sleep?) ===========================================================  -

## 2019-08-02 ENCOUNTER — Other Ambulatory Visit: Payer: Self-pay | Admitting: Internal Medicine

## 2019-08-02 DIAGNOSIS — F419 Anxiety disorder, unspecified: Secondary | ICD-10-CM

## 2019-08-02 MED ORDER — ALPRAZOLAM 0.5 MG PO TABS: ORAL_TABLET | ORAL | 0 refills | Status: DC

## 2019-10-08 DIAGNOSIS — L821 Other seborrheic keratosis: Secondary | ICD-10-CM | POA: Diagnosis not present

## 2019-10-08 DIAGNOSIS — L570 Actinic keratosis: Secondary | ICD-10-CM | POA: Diagnosis not present

## 2019-10-08 DIAGNOSIS — D485 Neoplasm of uncertain behavior of skin: Secondary | ICD-10-CM | POA: Diagnosis not present

## 2019-10-08 DIAGNOSIS — C44519 Basal cell carcinoma of skin of other part of trunk: Secondary | ICD-10-CM | POA: Diagnosis not present

## 2019-10-08 DIAGNOSIS — Z86006 Personal history of melanoma in-situ: Secondary | ICD-10-CM | POA: Diagnosis not present

## 2019-10-08 DIAGNOSIS — L814 Other melanin hyperpigmentation: Secondary | ICD-10-CM | POA: Diagnosis not present

## 2019-10-08 DIAGNOSIS — D225 Melanocytic nevi of trunk: Secondary | ICD-10-CM | POA: Diagnosis not present

## 2019-10-08 DIAGNOSIS — L578 Other skin changes due to chronic exposure to nonionizing radiation: Secondary | ICD-10-CM | POA: Diagnosis not present

## 2019-10-08 DIAGNOSIS — Z85828 Personal history of other malignant neoplasm of skin: Secondary | ICD-10-CM | POA: Diagnosis not present

## 2019-10-16 ENCOUNTER — Other Ambulatory Visit: Payer: Self-pay | Admitting: Internal Medicine

## 2019-10-16 DIAGNOSIS — F419 Anxiety disorder, unspecified: Secondary | ICD-10-CM

## 2019-10-24 ENCOUNTER — Other Ambulatory Visit: Payer: PPO

## 2019-10-24 ENCOUNTER — Other Ambulatory Visit: Payer: Self-pay

## 2019-10-24 DIAGNOSIS — Z20822 Contact with and (suspected) exposure to covid-19: Secondary | ICD-10-CM

## 2019-10-26 LAB — SARS-COV-2, NAA 2 DAY TAT

## 2019-10-26 LAB — NOVEL CORONAVIRUS, NAA: SARS-CoV-2, NAA: NOT DETECTED

## 2019-10-28 NOTE — Progress Notes (Signed)
MEDICARE ANNUAL WELLNESS VISIT AND CPE  Assessment:    Encounter for Medicare annual wellness exam 1 year  Aortic atherosclerosis ([place]) Control blood pressure, cholesterol, glucose, increase exercise.   Essential hypertension - continue medications, DASH diet, exercise and monitor at home. Call if greater than 130/80.  -     CBC with Differential/Platelet -     COMPLETE METABOLIC PANEL WITH GFR -     TSH  Hyperlipidemia, mixed check lipids decrease fatty foods increase activity.  -     Lipid panel  Atherosclerosis of coronary artery bypass graft of native heart without angina pectoris Control blood pressure, cholesterol, glucose, increase exercise.   Gastroesophageal reflux disease without esophagitis Continue PPI/H2 blocker, diet discussed  Abnormal glucose Discussed disease progression and risks Discussed diet/exercise, weight management and risk modification  Medication management -     Magnesium  Vitamin D deficiency -     Cancel: VITAMIN D 25 Hydroxy (Vit-D Deficiency, Fractures)  Overweight (BMI 25.0-29.9)  Overweight  - long discussion about weight loss, diet, and exercise -recommended diet heavy in fruits and veggies and low in animal meats, cheeses, and dairy products  Insomnia, unspecified type Monitor  DVT, HX OF Monitor  Elevated PSA Monitor  Hx of adenomatous polyp of colon Normal 2019, follow up 2024  Hypothyroidism, unspecified type -     TSH    Over 40 minutes of exam, counseling, chart review and critical decision making was performed  Future Appointments  Date Time Provider [place]  02/17/2020  3:00 PM Unk Pinto, MD [facility] None     Plan:   During the course of the visit the patient was educated and counseled about appropriate screening and preventive services including:    Pneumococcal vaccine  Prevnar 13   Influenza vaccine  Td vaccine  Screening electrocardiogram  Bone densitometry  screening  Colorectal cancer screening  Diabetes screening  Glaucoma screening  Nutrition counseling   Advanced directives: requested   Subjective:  [name] is a 64 y.o. male who presents for Medicare Annual Wellness Visit and complete physical.    He complains of constipation for 4-5 days. He had colonoscopy 2019, due 2024 with Dr. Carlean Purl. No blood in stool. No change in medications/supplements.   Brother is 13, has frontal temporal lobe dementia- has some paranoia- [facility] is [facility]. He has had increase stress with this.   His blood pressure has been controlled at home, today their BP is BP: 120/84  2008 s/p PCA/Stents with Dr. Harrington Challenger. Negative Cardiolite 2015.   BMI is Body mass index is 27.75 kg/m., he is working on diet and exercise. Wt Readings from Last 3 Encounters:  10/29/19 199 lb (90.3 kg)  07/24/19 197 lb 9.6 oz (89.6 kg)  03/07/19 191 lb (86.6 kg)    He does not workout, does yard work. He denies chest pain, shortness of breath, dizziness.   He is on cholesterol medication and denies myalgias. His cholesterol is at goal. Will forget to take occ.  The cholesterol last visit was:   Lab Results  Component Value Date   CHOL 137 07/24/2019   HDL 43 07/24/2019   LDLCALC 76 07/24/2019   LDLDIRECT 190.6 07/30/2010   TRIG 98 07/24/2019   CHOLHDL 3.2 07/24/2019    He has been working on diet and exercise for prediabetes, and denies polydipsia, polyuria and visual disturbances. Last A1C in the office was:  Lab Results  Component Value Date   HGBA1C 5.5  07/24/2019   Patient is on Vitamin D supplement.   Lab Results  Component Value Date   VD25OH 42 07/24/2019       Medication Review:   Current Outpatient Medications (Cardiovascular):  .  atorvastatin (LIPITOR) 80 MG tablet, Take 1 tablet Daily for Cholesterol .  lisinopril (ZESTRIL) 10 MG tablet, TAKE 1 TABLET BY MOUTH EVERY DAY FOR BLOOD PRESSURE .  sildenafil (REVATIO) 20 MG tablet,  TAKE 1 TO 5 TABS BY MOUTH DAILY AS NEEDED.   Current Outpatient Medications (Analgesics):  .  aspirin EC 81 MG tablet, 81 mg daily.    Current Outpatient Medications (Other):  Marland Kitchen  ALPRAZolam (XANAX) 0.5 MG tablet, Take 1/2 - 1 tablet 2 - 3 x /day at Bedtime ONLY if needed for Sleep & try limit taking to avoid Addiction & Dementia. .  Cholecalciferol (VITAMIN D) 50 MCG (2000 UT) tablet, Take 2,000 Units by mouth 2 (two) times daily. Marland Kitchen  esomeprazole (NEXIUM) 20 MG capsule, Take 20 mg by mouth daily at 12 noon. Takes OTC 20 mg .  finasteride (PROSCAR) 5 MG tablet, Take 1 table tDaily for Prostate  Allergies: Allergies  Allergen Reactions  . Penicillins     unspecified    Current Problems (verified) Patient Active Problem List   Diagnosis Date Noted  . Aortic atherosclerosis ([place]) 03/27/2019  . Insomnia 02/21/2019  . Overweight (BMI 25.0-29.9) 03/29/2017  . Elevated PSA 09/16/2015  . Abnormal glucose 07/24/2013  . Vitamin D deficiency 07/24/2013  . Medication management 07/24/2013  . Hx of adenomatous polyp of colon 06/09/2010  . Essential hypertension 08/05/2008  . Hyperlipidemia, mixed 08/02/2006  . ASCAD s/p PTCA/Stent (2005)  08/02/2006  . Gastroesophageal reflux disease 08/02/2006  . DVT, HX OF 08/02/2006    Screening Tests Immunization History  Administered Date(s) Administered  . Influenza,inj,Quad PF,6+ Mos 12/07/2016, 11/21/2018  . Influenza-Unspecified 01/28/2018  . Moderna SARS-COVID-2 Vaccination 03/01/2019, 04/05/2019  . PPD Test 07/24/2013, 07/30/2014, 09/16/2016, 10/23/2017  . Tdap 07/30/2010   Health Maintenance  Topic Date Due  . PNA vac Low Risk Adult (1 of 2 - PCV13) Never done  . INFLUENZA VACCINE  09/08/2019  . TETANUS/TDAP  07/29/2020  . COLONOSCOPY  08/10/2022  . COVID-19 Vaccine  Completed  . Hepatitis C Screening  Completed  . HIV Screening  Completed    Preventative care: Last colonoscopy: Last Colon - 08/09/2017 - Dr Carlean Purl recc 5 yr  f/u - July 2024  Prior vaccinations: TD or Tdap: UTD  Influenza: UTD Pneumococcal: Declines Prevnar13: Declines Shingles/Zostavax: information given  Names of Other Physician/Practitioners you currently use: Patient Care Team: Unk Pinto, MD as PCP - General (Internal Medicine)  Surgical: Past Surgical History:  Procedure Laterality Date  . COLONOSCOPY    . COLONOSCOPY W/ POLYPECTOMY  06/16/2010   diminutive adenoma, diverticulosis, hemorrhoids  . CORONARY ANGIOPLASTY WITH STENT PLACEMENT    . ESOPHAGOGASTRODUODENOSCOPY  06/16/2010   antral ulcers, esophageal stricture - dilated, duodenitis, hiatal hernia  . HERNIA REPAIR Right   . INGUINAL HERNIA REPAIR Right 11/05/2018   Procedure: OPEN RIGHT INGUINAL HERNIA REPAIR WITH MESH;  Surgeon: Coralie Keens, MD;  Location: [facility];  Service: General;  Laterality: Right;  TAP BLOCK  . PILONIDAL CYST EXCISION     teenager  . POLYPECTOMY    . UPPER GASTROINTESTINAL ENDOSCOPY     Family His family history includes Colon cancer in his father; Dementia in his mother. Social history  He reports that he has never smoked.  He has never used smokeless tobacco. He reports current alcohol use. He reports that he does not use drugs.  MEDICARE WELLNESS OBJECTIVES: Physical activity: Current Exercise Habits: The patient does not participate in regular exercise at present (yard work) Cardiac risk factors: Cardiac Risk Factors include: advanced age (>70men, >68 women);dyslipidemia;hypertension;male gender Depression/mood screen:   Depression screen [facility] 2/9 07/24/2019  Decreased Interest 0  Down, Depressed, Hopeless 0  PHQ - 2 Score 0  Altered sleeping -  Tired, decreased energy -  Change in appetite -  Feeling bad or failure about yourself  -  Trouble concentrating -  Moving slowly or fidgety/restless -  Suicidal thoughts -  PHQ-9 Score -    ADLs:  In your present state of health, do you have any difficulty  performing the following activities: 10/29/2019 07/24/2019  Hearing? N N  Vision? N N  Difficulty concentrating or making decisions? N N  Walking or climbing stairs? N N  Dressing or bathing? N N  Doing errands, shopping? N N  Some recent data might be hidden     Cognitive Testing  Alert? Yes  Normal Appearance?Yes  Oriented to person? Yes  Place? Yes   Time? Yes  Recall of three objects?  Yes  Can perform simple calculations? Yes  Displays appropriate judgment?Yes  Can read the correct time from a watch face?Yes  EOL planning: Does Patient Have a Medical Advance Directive?: Yes Type of Advance Directive: Healthcare Power of Attorney, Living will [place] in Chart?: No - copy requested  ROS   Objective:     Today's Vitals   10/29/19 1051  BP: 120/84  Pulse: 67  Temp: 97.6 F (36.4 C)  SpO2: 99%  Weight: 199 lb (90.3 kg)  Height: 5\' 11"  (1.803 m)  PainSc: 0-No pain   Body mass index is 27.75 kg/m.  General appearance: alert, no distress, WD/WN, male HEENT: normocephalic, sclerae anicteric, TMs pearly, nares patent, no discharge or erythema, pharynx normal Oral cavity: MMM, no lesions Neck: supple, no lymphadenopathy, no thyromegaly, no masses Heart: RRR, normal S1, S2, no murmurs Lungs: CTA bilaterally, no wheezes, rhonchi, or rales Abdomen: +bs, soft, non tender, non distended, no masses, no hepatomegaly, no splenomegaly Musculoskeletal: nontender, no swelling, no obvious deformity Extremities: no edema, no cyanosis, no clubbing Pulses: 2+ symmetric, upper and lower extremities, normal cap refill Neurological: alert, oriented x 3, CN2-12 intact, strength normal upper extremities and lower extremities, sensation normal throughout, DTRs 2+ throughout, no cerebellar signs, gait Normal Psychiatric: normal affect, behavior normal, pleasant   Medicare Attestation I have personally reviewed: The patient's medical and social history Their  use of alcohol, tobacco or illicit drugs Their current medications and supplements The patient's functional ability including ADLs,fall risks, home safety risks, cognitive, and hearing and visual impairment Diet and physical activities Evidence for depression or mood disorders  The patient's weight, height, BMI, and visual acuity have been recorded in the chart.  I have made referrals, counseling, and provided education to the patient based on review of the above and I have provided the patient with a written personalized care plan for preventive services.     Vicie Mutters, PA-C   10/29/2019

## 2019-10-29 ENCOUNTER — Other Ambulatory Visit: Payer: Self-pay

## 2019-10-29 ENCOUNTER — Ambulatory Visit (INDEPENDENT_AMBULATORY_CARE_PROVIDER_SITE_OTHER): Payer: PPO | Admitting: Physician Assistant

## 2019-10-29 ENCOUNTER — Encounter: Payer: Self-pay | Admitting: Physician Assistant

## 2019-10-29 VITALS — BP 120/84 | HR 67 | Temp 97.6°F | Ht 71.0 in | Wt 199.0 lb

## 2019-10-29 DIAGNOSIS — Z86010 Personal history of colonic polyps: Secondary | ICD-10-CM | POA: Diagnosis not present

## 2019-10-29 DIAGNOSIS — Z79899 Other long term (current) drug therapy: Secondary | ICD-10-CM

## 2019-10-29 DIAGNOSIS — K219 Gastro-esophageal reflux disease without esophagitis: Secondary | ICD-10-CM | POA: Diagnosis not present

## 2019-10-29 DIAGNOSIS — Z86718 Personal history of other venous thrombosis and embolism: Secondary | ICD-10-CM | POA: Diagnosis not present

## 2019-10-29 DIAGNOSIS — I10 Essential (primary) hypertension: Secondary | ICD-10-CM

## 2019-10-29 DIAGNOSIS — R7309 Other abnormal glucose: Secondary | ICD-10-CM | POA: Diagnosis not present

## 2019-10-29 DIAGNOSIS — R6889 Other general symptoms and signs: Secondary | ICD-10-CM | POA: Diagnosis not present

## 2019-10-29 DIAGNOSIS — G4700 Insomnia, unspecified: Secondary | ICD-10-CM

## 2019-10-29 DIAGNOSIS — I25810 Atherosclerosis of coronary artery bypass graft(s) without angina pectoris: Secondary | ICD-10-CM | POA: Diagnosis not present

## 2019-10-29 DIAGNOSIS — Z0000 Encounter for general adult medical examination without abnormal findings: Secondary | ICD-10-CM

## 2019-10-29 DIAGNOSIS — Z0001 Encounter for general adult medical examination with abnormal findings: Secondary | ICD-10-CM | POA: Diagnosis not present

## 2019-10-29 DIAGNOSIS — E559 Vitamin D deficiency, unspecified: Secondary | ICD-10-CM

## 2019-10-29 DIAGNOSIS — I700 Atherosclerosis of aorta: Secondary | ICD-10-CM

## 2019-10-29 DIAGNOSIS — E039 Hypothyroidism, unspecified: Secondary | ICD-10-CM | POA: Diagnosis not present

## 2019-10-29 DIAGNOSIS — E782 Mixed hyperlipidemia: Secondary | ICD-10-CM

## 2019-10-29 DIAGNOSIS — R972 Elevated prostate specific antigen [PSA]: Secondary | ICD-10-CM

## 2019-10-29 DIAGNOSIS — E663 Overweight: Secondary | ICD-10-CM

## 2019-10-29 NOTE — Patient Instructions (Signed)
Increase water 64-80 oz a day Avoid bananas- this causes constipation Add more pears, peaches, apples, etc Increase exercise Get a squatty potty or boost your feet up on something.   Can add magnesium 400mg  as needed  About Constipation  Constipation Overview Constipation is the most common gastrointestinal complaint -- about 4 million Americans experience constipation and make 2.5 million physician visits a year to get help for the problem.  Constipation can occur when the colon absorbs too much water, the colon's muscle contraction is slow or sluggish, and/or there is delayed transit time through the colon.  The result is stool that is hard and dry.  Indicators of constipation include straining during bowel movements greater than 25% of the time, having fewer than three bowel movements per week, and/or the feeling of incomplete evacuation.  There are established guidelines ([place] ) for defining constipation. A person needs to have two or more of the following symptoms for at least 12 weeks (not necessarily consecutive) in the preceding 12 months: . Straining in  greater than 25% of bowel movements . Lumpy or hard stools in greater than 25% of bowel movements . Sensation of incomplete emptying in greater than 25% of bowel movements . Sensation of anorectal obstruction/blockade in greater than 25% of bowel movements . Manual maneuvers to help empty greater than 25% of bowel movements (e.g., digital evacuation, support of the pelvic floor)  . Less than  3 bowel movements/week . Loose stools are not present, and criteria for irritable bowel syndrome are insufficient  Common Causes of Constipation . Lack of fiber in your diet . Lack of physical activity . Medications, including iron and calcium supplements  . Dairy intake . Dehydration . Abuse of laxatives  Travel  Irritable Bowel Syndrome  Pregnancy  Luteal phase of menstruation (after ovulation and before menses)  Colorectal  problems  Intestinal Dysfunction  Treating Constipation  There are several ways of treating constipation, including changes to diet and exercise, use of laxatives, adjustments to the pelvic floor, and scheduled toileting.  These treatments include: . increasing fiber and fluids in the diet  . increasing physical activity . learning muscle coordination   learning proper toileting techniques and toileting modifications   designing and sticking  to a toileting schedule     2007, [employer] Doc.22

## 2019-10-30 LAB — COMPLETE METABOLIC PANEL WITH GFR
AG Ratio: 2.4 (calc) (ref 1.0–2.5)
ALT: 14 U/L (ref 9–46)
AST: 14 U/L (ref 10–35)
Albumin: 4.7 g/dL (ref 3.6–5.1)
Alkaline phosphatase (APISO): 59 U/L (ref 35–144)
BUN: 21 mg/dL (ref 7–25)
CO2: 27 mmol/L (ref 20–32)
Calcium: 10 mg/dL (ref 8.6–10.3)
Chloride: 107 mmol/L (ref 98–110)
Creat: 1.18 mg/dL (ref 0.70–1.25)
GFR, Est African American: 75 mL/min/{1.73_m2} (ref >=60)
GFR, Est Non African American: 64 mL/min/{1.73_m2} (ref >=60)
Globulin: 2 g/dL (calc) (ref 1.9–3.7)
Glucose, Bld: 85 mg/dL (ref 65–99)
Potassium: 4.6 mmol/L (ref 3.5–5.3)
Sodium: 141 mmol/L (ref 135–146)
Total Bilirubin: 0.5 mg/dL (ref 0.2–1.2)
Total Protein: 6.7 g/dL (ref 6.1–8.1)

## 2019-10-30 LAB — CBC WITH DIFFERENTIAL/PLATELET
Absolute Monocytes: 560 cells/uL (ref 200–950)
Basophils Absolute: 40 cells/uL (ref 0–200)
Basophils Relative: 0.5 %
Eosinophils Absolute: 128 cells/uL (ref 15–500)
Eosinophils Relative: 1.6 %
HCT: 43 % (ref 38.5–50.0)
Hemoglobin: 14.8 g/dL (ref 13.2–17.1)
Lymphs Abs: 1384 cells/uL (ref 850–3900)
MCH: 32.1 pg (ref 27.0–33.0)
MCHC: 34.4 g/dL (ref 32.0–36.0)
MCV: 93.3 fL (ref 80.0–100.0)
MPV: 12 fL (ref 7.5–12.5)
Monocytes Relative: 7 %
Neutro Abs: 5888 cells/uL (ref 1500–7800)
Neutrophils Relative %: 73.6 %
Platelets: 280 10*3/uL (ref 140–400)
RBC: 4.61 10*6/uL (ref 4.20–5.80)
RDW: 12.6 % (ref 11.0–15.0)
Total Lymphocyte: 17.3 %
WBC: 8 10*3/uL (ref 3.8–10.8)

## 2019-10-30 LAB — LIPID PANEL
Cholesterol: 159 mg/dL (ref <200)
HDL: 47 mg/dL (ref >=40)
LDL Cholesterol (Calc): 89 mg/dL (calc)
Non-HDL Cholesterol (Calc): 112 mg/dL (calc) (ref <130)
Total CHOL/HDL Ratio: 3.4 (calc) (ref <5.0)
Triglycerides: 126 mg/dL (ref <150)

## 2019-10-30 LAB — MAGNESIUM: Magnesium: 1.9 mg/dL (ref 1.5–2.5)

## 2019-10-30 LAB — TSH: TSH: 4.17 mIU/L (ref 0.40–4.50)

## 2019-10-30 LAB — VITAMIN D 25 HYDROXY (VIT D DEFICIENCY, FRACTURES): Vit D, 25-Hydroxy: 67 ng/mL (ref 30–100)

## 2019-11-11 ENCOUNTER — Other Ambulatory Visit: Payer: Self-pay | Admitting: Internal Medicine

## 2019-11-20 DIAGNOSIS — D485 Neoplasm of uncertain behavior of skin: Secondary | ICD-10-CM | POA: Diagnosis not present

## 2019-11-20 DIAGNOSIS — D239 Other benign neoplasm of skin, unspecified: Secondary | ICD-10-CM | POA: Diagnosis not present

## 2019-11-20 DIAGNOSIS — L821 Other seborrheic keratosis: Secondary | ICD-10-CM | POA: Diagnosis not present

## 2019-11-20 DIAGNOSIS — B078 Other viral warts: Secondary | ICD-10-CM | POA: Diagnosis not present

## 2019-11-20 DIAGNOSIS — D225 Melanocytic nevi of trunk: Secondary | ICD-10-CM | POA: Diagnosis not present

## 2019-11-29 ENCOUNTER — Other Ambulatory Visit: Payer: Self-pay | Admitting: Adult Health

## 2019-11-29 DIAGNOSIS — F419 Anxiety disorder, unspecified: Secondary | ICD-10-CM

## 2020-01-15 ENCOUNTER — Encounter: Payer: 59 | Admitting: Internal Medicine

## 2020-01-21 ENCOUNTER — Other Ambulatory Visit: Payer: Self-pay | Admitting: Internal Medicine

## 2020-01-21 DIAGNOSIS — F419 Anxiety disorder, unspecified: Secondary | ICD-10-CM

## 2020-01-22 NOTE — Progress Notes (Signed)
    History of Present Illness:      Patient is a very nice 64 yo MWM with HTN, ASCAD/Stent, HLD, who relates 15 yr hx/o LBP.  In 2018, he had a Lumbar MRI for Lt sciatica with equivocal Lt S1"irritation" and he was treated with a steroid pulse taper and sx's resolved. Her now reports intermittent numbness of his Lt leg especially associated with lying horizontal in bed. He also reports an aching discomfort in the Lt buttock.   Medications  .  atorvastatin  80 MG tablet, Take 1 tablet Daily for Cholesterol .  lisinopril  10 MG tablet, Take      1 tablet      Daily      for BP .  sildenafil  20 MG tablet, TAKE 1 TO 5 TABS  DAILY AS NEEDED. Marland Kitchen  aspirin EC 81 MG tabletdaily.  Marland Kitchen  ALPRAZolam  0.5 MG tablet, TAKE HALF TO 1 TAB AT BEDTIME IF NEEDED) .  CVITAMIN D(2000 UT) tablet, Take 2,000 Units 2  times daily. Marland Kitchen  esomeprazole (NEXIUM) 20 MG capsule, Take  daily at 12 noon. .  finasteride (PROSCAR) 5 MG tablet, Take 1 tablet Daily for Prostate  Problem list He has Hyperlipidemia, mixed; Essential hypertension; ASCAD s/p PTCA/Stent (2005) ; Gastroesophageal reflux disease; DVT, HX OF; Abnormal glucose; Vitamin D deficiency; Medication management; Elevated PSA; Overweight (BMI 25.0-29.9); Hx of adenomatous polyp of colon; Insomnia; and Aortic atherosclerosis (HCC) on their problem list.   Observations/Objective:  BP 114/72   Pulse 76   Temp (!) 97.5 F (36.4 C)   Ht 5\' 11"  (1.803 m)   Wt 192 lb 9.6 oz (87.4 kg)   SpO2 97%   BMI 26.86 kg/m   HEENT - WNL. Neck - supple.  Chest - Clear equal BS. Cor - Nl HS. RRR w/o sig MGR. PP 1(+). No edema. MS- FROM w/o deformities.  Neg Hip & knee ROMs.   (+) tender over Left Ischial tuberosity. Gait Nl. Neuro -  Nl w/o focal abnormalities. Neg SLR.     Assessment and Plan:  1. Sciatica of left side  - dexamethasone 4 MG ; Take 1 tab 3 x day/3 days, then 2 x day/3 days, then 1 tab daily  Disp: 20 tab - cyclobenzaprine  10 MG tablet; Take 1/2-1  tablet   3 x /day Disp: 90 tablet  2. Ischial bursitis of left side  - dexamethasone 4 MG; Take 1 tab 3 x day/3 days, then 2 x day/3 days, then 1 tab daily  Disp: 20 tab  Follow Up Instructions:        I discussed the assessment and treatment plan with the patient. The patient was provided an opportunity to ask questions and all were answered. The patient agreed with the plan and demonstratedan understanding of the instructions.       The patient was advised to call back or seek an in-person evaluation if the symptoms worsen or if the condition fails to improve as anticipated.    Kirtland Bouchard, MD

## 2020-01-23 ENCOUNTER — Other Ambulatory Visit: Payer: Self-pay

## 2020-01-23 ENCOUNTER — Ambulatory Visit (INDEPENDENT_AMBULATORY_CARE_PROVIDER_SITE_OTHER): Payer: PPO | Admitting: Internal Medicine

## 2020-01-23 ENCOUNTER — Encounter: Payer: Self-pay | Admitting: Internal Medicine

## 2020-01-23 VITALS — BP 114/72 | HR 76 | Temp 97.5°F | Ht 71.0 in | Wt 192.6 lb

## 2020-01-23 DIAGNOSIS — M7072 Other bursitis of hip, left hip: Secondary | ICD-10-CM

## 2020-01-23 DIAGNOSIS — M5432 Sciatica, left side: Secondary | ICD-10-CM

## 2020-01-23 MED ORDER — DEXAMETHASONE 4 MG PO TABS: ORAL_TABLET | ORAL | 0 refills | Status: DC

## 2020-01-23 MED ORDER — CYCLOBENZAPRINE HCL 10 MG PO TABS: ORAL_TABLET | ORAL | 0 refills | Status: DC

## 2020-01-25 ENCOUNTER — Encounter: Payer: Self-pay | Admitting: Internal Medicine

## 2020-01-25 NOTE — Patient Instructions (Signed)

## 2020-02-11 ENCOUNTER — Other Ambulatory Visit: Payer: Self-pay | Admitting: Internal Medicine

## 2020-02-16 ENCOUNTER — Encounter: Payer: Self-pay | Admitting: Internal Medicine

## 2020-02-16 NOTE — Patient Instructions (Signed)

## 2020-02-16 NOTE — Progress Notes (Signed)
Annual  Screening/Preventative Visit  & Comprehensive Evaluation & Examination      This very nice 65 y.o. MWM  presents for a Screening /Preventative Visit & comprehensive evaluation and management of multiple medical co-morbidities.  Patient has been followed for HTN, HLD, Prediabetes and Vitamin D Deficiency. In Aug 2020, patient was noted to have Aortic Atherosclerosis by Abdominal CT scan.      HTN predates circa 2008. Patient's BP has been controlled at home.  Today's BP: 120/80.  In 2008, patient had PCA & Stents x 2 by Dr Dorris Carnes.  Patient had a negative Cardiolite in 2015.  Patient denies any cardiac symptoms as chest pain, palpitations, shortness of breath, dizziness or ankle swelling.      Patient's hyperlipidemia is controlled with diet and medications. Patient denies myalgias or other medication SE's. Last lipids were at goal:  Lab Results  Component Value Date   CHOL 159 10/29/2019   HDL 47 10/29/2019   LDLCALC 89 10/29/2019   TRIG 126 10/29/2019   CHOLHDL 3.4 10/29/2019       Patient has hx/o prediabetes (A1c6.1% /2014&6.3% /2015)  and patient denies reactive hypoglycemic symptoms, visual blurring, diabetic polys or paresthesias. Last A1c was Normal & at goal:   Lab Results  Component Value Date   HGBA1C 5.5 07/24/2019        Finally, patient has history of Vitamin D Deficiency ("43" /2014 & "30" /2016) and last vitamin D was at goal:   Lab Results  Component Value Date   VD25OH 67 10/29/2019    Current Outpatient Medications on File Prior to Visit  Medication Sig  . ALPRAZolam 0.5 MG tab TAKE HALF TO 1 TAB AT BEDTIME IF NEEDED (Dx: g47.00)  . aspirin EC 81 MG tab Take 1 tablet  daily.   Marland Kitchen atorvastatin  80 MG tab Take 1 tablet Daily for Cholesterol  . VITAMIN D 2000 U  Take 2,000 Units  2  times daily.  . cyclobenzaprine 10 MG  Take 1/2 to 1 tablet 3 x /day as needed   . esomeprazole  20 MG cap Take 2 daily at 12 noon  . finasteride 5 MG tab Take 1  tablet Daily for Prostate   . lisinopril  10 MG tab TAKE 1 TABLET DAILY FOR BLOOD PRESSURE  . sildenafil  20 MG tab TAKE 1 TO 5 TABS  DAILY AS NEEDED.    Allergies  Allergen Reactions  . Penicillins     Past Medical History:  Diagnosis Date  . ANXIETY 12/25/2007  . ANXIETY STATE NEC 11/01/2006  . CORONARY ARTERY DISEASE 08/02/2006   off Plavix x 3 years  . DIVERTICULITIS OF COLON 01/06/2010  . DVT, HX OF 08/02/2006  . 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 History of thyroiditis 12/15/2018  . Hx of adenomatous polyp of colon 06/09/2010  . HYPERLIPIDEMIA 08/02/2006  . HYPERTENSION 08/05/2008  . Hypothyroidism 03/19/2014  . Memory loss 07/21/2009  . Pilonidal cyst    low back pain  . Prostatitis 09/16/2015  . Recurrent inguinal hernia    right   Health Maintenance  Topic Date Due  . PNA vac Low Risk Adult (1 of 2 - PCV13) Never done  . INFLUENZA VACCINE  09/08/2019  . COVID-19 Vaccine (3 - Booster for Moderna series) 10/03/2019  . TETANUS/TDAP  07/29/2020  . COLONOSCOPY (Pts 45-31yrs Insurance coverage will need to be confirmed)  08/10/2022  . Hepatitis C Screening  Completed   Immunization History  Administered  Date(s) Administered  . Influenza,inj,Quad PF,6+ Mos 12/07/2016, 11/21/2018  . Influenza-Unspecified 01/28/2018  . Moderna Sars-Covid-2 Vaccination 03/01/2019, 04/05/2019  . PPD Test 07/24/2013, 07/30/2014, 09/16/2016, 10/23/2017  . Tdap 07/30/2010   Last Colon -  08/09/2017 - Dr Leone PayorGessner recc 5 yr f/u - July 2024  Past Surgical History:  Procedure Laterality Date  . COLONOSCOPY    . COLONOSCOPY W/ POLYPECTOMY  06/16/2010   diminutive adenoma, diverticulosis, hemorrhoids  . CORONARY ANGIOPLASTY WITH STENT PLACEMENT    . ESOPHAGOGASTRODUODENOSCOPY  06/16/2010   antral ulcers, esophageal stricture - dilated, duodenitis, hiatal hernia  . HERNIA REPAIR Right   . INGUINAL HERNIA REPAIR Right 11/05/2018   Procedure: OPEN RIGHT INGUINAL HERNIA REPAIR WITH MESH;  Surgeon: Abigail MiyamotoBlackman,  Douglas, MD;  Location: [facility];  Service: General;  Laterality: Right;  TAP BLOCK  . PILONIDAL CYST EXCISION     teenager  . POLYPECTOMY    . UPPER GASTROINTESTINAL ENDOSCOPY     Family History  Problem Relation Age of Onset  . Colon cancer Father        deceased age 67  . Dementia Mother   . Breast cancer Neg Hx   . Colon polyps Neg Hx   . Esophageal cancer Neg Hx   . Liver cancer Neg Hx   . Pancreatic cancer Neg Hx   . Rectal cancer Neg Hx   . Stomach cancer Neg Hx    Social History   Socioeconomic History  . Marital status: Married    Spouse name: Not on file  . Number of children: 2  Occupational History  . Occupation: Production managerproperty manager    Employer: [employer]   Tobacco Use  . Smoking status: Never Smoker  . Smokeless tobacco: Never Used  Vaping Use  . Vaping Use: Never used  Substance and Sexual Activity  . Alcohol use: Yes    Comment: approx 2 drinks per week  . Drug use: Never  . Sexual activity: Not on file    ROS Constitutional: Denies fever, chills, weight loss/gain, headaches, insomnia,  night sweats or change in appetite. Does c/o fatigue. Eyes: Denies redness, blurred vision, diplopia, discharge, itchy or watery eyes.  ENT: Denies discharge, congestion, post nasal drip, epistaxis, sore throat, earache, hearing loss, dental pain, Tinnitus, Vertigo, Sinus pain or snoring.  Cardio: Denies chest pain, palpitations, irregular heartbeat, syncope, dyspnea, diaphoresis, orthopnea, PND, claudication or edema Respiratory: denies cough, dyspnea, DOE, pleurisy, hoarseness, laryngitis or wheezing.  Gastrointestinal: Denies dysphagia, heartburn, reflux, water brash, pain, cramps, nausea, vomiting, bloating, diarrhea, constipation, hematemesis, melena, hematochezia, jaundice or hemorrhoids Genitourinary: Denies dysuria, frequency, urgency, nocturia, hesitancy, discharge, hematuria or flank pain . Has urgency, nocturia x 1-3 & occasional  hesitancy. Musculoskeletal: Denies arthralgia, myalgia, stiffness, Jt. Swelling, pain, limp or strain/sprain. Denies Falls. Skin: Denies puritis, rash, hives, warts, acne, eczema or change in skin lesion Neuro: No weakness, tremor, incoordination, spasms, paresthesia or pain Psychiatric: Denies confusion, memory loss or sensory loss. Denies Depression. Endocrine: Denies change in weight, skin, hair change, nocturia, and paresthesia, diabetic polys, visual blurring or hyper / hypo glycemic episodes.  Heme/Lymph: No excessive bleeding, bruising or enlarged lymph nodes.  Physical Exam  BP 120/80   Pulse 76   Temp (!) 97.5 F (36.4 C)   Resp 16   Ht 5' 10.75" (1.797 m)   Wt 189 lb 3.2 oz (85.8 kg)   SpO2 99%   BMI 26.57 kg/m   General Appearance: Well nourished and well groomed and in  no apparent distress.  Eyes: PERRLA, EOMs, conjunctiva no swelling or erythema, normal fundi and vessels. Sinuses: No frontal/maxillary tenderness ENT/Mouth: EACs patent / TMs  nl. Nares clear without erythema, swelling, mucoid exudates. Oral hygiene is good. No erythema, swelling, or exudate. Tongue normal, non-obstructing. Tonsils not swollen or erythematous. Hearing normal.  Neck: Supple, thyroid not palpable. No bruits, nodes or JVD. Respiratory: Respiratory effort normal.  BS equal and clear bilateral without rales, rhonci, wheezing or stridor. Cardio: Heart sounds are normal with regular rate and rhythm and no murmurs, rubs or gallops. Peripheral pulses are normal and equal bilaterally without edema. No aortic or femoral bruits. Chest: symmetric with normal excursions and percussion.  Abdomen: Soft, with Nl bowel sounds. Nontender, no guarding, rebound, hernias, masses, or organomegaly.  Lymphatics: Non tender without lymphadenopathy.  Musculoskeletal: Full ROM all peripheral extremities, joint stability, 5/5 strength, and normal gait. Skin: Warm and dry without rashes, lesions, cyanosis, clubbing or   ecchymosis.  Neuro: Cranial nerves intact, reflexes equal bilaterally. Normal muscle tone, no cerebellar symptoms. Sensation intact.  Pysch: Alert and oriented X 3 with normal affect, insight and judgment appropriate.   Assessment and Plan  1. Annual Preventative/Screening Exam    2. Essential hypertension  - EKG 12-Lead - [place], RETROPERITNL ABD,  LTD - Urinalysis, Routine w reflex microscopic - Microalbumin / creatinine urine ratio - CBC with Differential/Platelet - COMPLETE METABOLIC PANEL WITH GFR - Magnesium - TSH  3. Hyperlipidemia, mixed  - EKG 12-Lead - [place], RETROPERITNL ABD,  LTD - Lipid panel - TSH  4. Abnormal glucose  - EKG 12-Lead - [place], RETROPERITNL ABD,  LTD - Hemoglobin A1c - Insulin, random  5. Vitamin D deficiency  - VITAMIN D 25 Hydroxy  6. Gastroesophageal reflux disease   - CBC with Differential/Platelet  7. Atherosclerosis of coronary artery bypass  graft of native heart without angina pectoris  - EKG 12-Lead - [place], RETROPERITNL ABD,  LTD - Lipid panel  8. Aortic atherosclerosis ([place]) by Abd CT 09/27/2018  - EKG 12-Lead - [place], RETROPERITNL ABD,  LTD - Lipid panel  9. Screening for colorectal cancer  - POC Hemoccult Bld/Stl   10. BPH with obstruction/lower urinary tract symptoms  - PSA  11. Prostate cancer screening  - PSA  12. Screening for ischemic heart disease  - EKG 12-Lead  13. FH: hypertension  - EKG 12-Lead - [place], RETROPERITNL ABD,  LTD  14. Screening for AAA (aortic abdominal aneurysm)  - [place], RETROPERITNL ABD,  LTD  15. Medication management  - Urinalysis, Routine w reflex microscopic - Microalbumin / creatinine urine ratio - CBC with Differential/Platelet - COMPLETE METABOLIC PANEL WITH GFR - Magnesium - Lipid panel - TSH - Hemoglobin A1c - Insulin, random - VITAMIN D 25 Hydroxy        Patient was counseled in prudent diet, weight control to achieve/maintain BMI less than 25, BP monitoring, regular  exercise and medications as discussed.  Discussed med effects and SE's. Routine screening labs and tests as requested with regular follow-up as recommended. Over 40 minutes of exam, counseling, chart review and high complex critical decision making was performed   Kirtland Bouchard, MD

## 2020-02-17 ENCOUNTER — Ambulatory Visit (INDEPENDENT_AMBULATORY_CARE_PROVIDER_SITE_OTHER): Payer: PPO | Admitting: Internal Medicine

## 2020-02-17 ENCOUNTER — Encounter: Payer: Self-pay | Admitting: Internal Medicine

## 2020-02-17 ENCOUNTER — Other Ambulatory Visit: Payer: Self-pay

## 2020-02-17 VITALS — BP 120/80 | HR 76 | Temp 97.5°F | Resp 16 | Ht 70.75 in | Wt 189.2 lb

## 2020-02-17 DIAGNOSIS — N138 Other obstructive and reflux uropathy: Secondary | ICD-10-CM | POA: Diagnosis not present

## 2020-02-17 DIAGNOSIS — K219 Gastro-esophageal reflux disease without esophagitis: Secondary | ICD-10-CM | POA: Diagnosis not present

## 2020-02-17 DIAGNOSIS — Z0000 Encounter for general adult medical examination without abnormal findings: Secondary | ICD-10-CM | POA: Diagnosis not present

## 2020-02-17 DIAGNOSIS — I700 Atherosclerosis of aorta: Secondary | ICD-10-CM | POA: Diagnosis not present

## 2020-02-17 DIAGNOSIS — E559 Vitamin D deficiency, unspecified: Secondary | ICD-10-CM | POA: Diagnosis not present

## 2020-02-17 DIAGNOSIS — Z136 Encounter for screening for cardiovascular disorders: Secondary | ICD-10-CM | POA: Diagnosis not present

## 2020-02-17 DIAGNOSIS — E782 Mixed hyperlipidemia: Secondary | ICD-10-CM | POA: Diagnosis not present

## 2020-02-17 DIAGNOSIS — R7309 Other abnormal glucose: Secondary | ICD-10-CM | POA: Diagnosis not present

## 2020-02-17 DIAGNOSIS — Z79899 Other long term (current) drug therapy: Secondary | ICD-10-CM | POA: Diagnosis not present

## 2020-02-17 DIAGNOSIS — Z125 Encounter for screening for malignant neoplasm of prostate: Secondary | ICD-10-CM

## 2020-02-17 DIAGNOSIS — N401 Enlarged prostate with lower urinary tract symptoms: Secondary | ICD-10-CM | POA: Diagnosis not present

## 2020-02-17 DIAGNOSIS — I25810 Atherosclerosis of coronary artery bypass graft(s) without angina pectoris: Secondary | ICD-10-CM

## 2020-02-17 DIAGNOSIS — I10 Essential (primary) hypertension: Secondary | ICD-10-CM

## 2020-02-17 DIAGNOSIS — Z8249 Family history of ischemic heart disease and other diseases of the circulatory system: Secondary | ICD-10-CM | POA: Diagnosis not present

## 2020-02-17 DIAGNOSIS — Z0001 Encounter for general adult medical examination with abnormal findings: Secondary | ICD-10-CM

## 2020-02-17 DIAGNOSIS — Z1211 Encounter for screening for malignant neoplasm of colon: Secondary | ICD-10-CM

## 2020-02-18 LAB — URINALYSIS, ROUTINE W REFLEX MICROSCOPIC
Bilirubin Urine: NEGATIVE
Glucose, UA: NEGATIVE
Hgb urine dipstick: NEGATIVE
Ketones, ur: NEGATIVE
Leukocytes,Ua: NEGATIVE
Nitrite: NEGATIVE
Protein, ur: NEGATIVE
Specific Gravity, Urine: 1.019 (ref 1.001–1.03)
pH: 7 (ref 5.0–8.0)

## 2020-02-18 LAB — HEMOGLOBIN A1C
Hgb A1c MFr Bld: 6.1 % of total Hgb — ABNORMAL HIGH (ref <5.7)
Mean Plasma Glucose: 128 mg/dL
eAG (mmol/L): 7.1 mmol/L

## 2020-02-18 LAB — COMPLETE METABOLIC PANEL WITH GFR
AG Ratio: 2 (calc) (ref 1.0–2.5)
ALT: 13 U/L (ref 9–46)
AST: 12 U/L (ref 10–35)
Albumin: 4.1 g/dL (ref 3.6–5.1)
Alkaline phosphatase (APISO): 53 U/L (ref 35–144)
BUN/Creatinine Ratio: 16 (calc) (ref 6–22)
BUN: 23 mg/dL (ref 7–25)
CO2: 27 mmol/L (ref 20–32)
Calcium: 9.4 mg/dL (ref 8.6–10.3)
Chloride: 104 mmol/L (ref 98–110)
Creat: 1.44 mg/dL — ABNORMAL HIGH (ref 0.70–1.25)
GFR, Est African American: 58 mL/min/{1.73_m2} — ABNORMAL LOW (ref >=60)
GFR, Est Non African American: 50 mL/min/{1.73_m2} — ABNORMAL LOW (ref >=60)
Globulin: 2.1 g/dL (calc) (ref 1.9–3.7)
Glucose, Bld: 84 mg/dL (ref 65–99)
Potassium: 4.4 mmol/L (ref 3.5–5.3)
Sodium: 138 mmol/L (ref 135–146)
Total Bilirubin: 0.5 mg/dL (ref 0.2–1.2)
Total Protein: 6.2 g/dL (ref 6.1–8.1)

## 2020-02-18 LAB — CBC WITH DIFFERENTIAL/PLATELET
Absolute Monocytes: 538 cells/uL (ref 200–950)
Basophils Absolute: 38 cells/uL (ref 0–200)
Basophils Relative: 0.6 %
Eosinophils Absolute: 141 cells/uL (ref 15–500)
Eosinophils Relative: 2.2 %
HCT: 41.3 % (ref 38.5–50.0)
Hemoglobin: 14.3 g/dL (ref 13.2–17.1)
Lymphs Abs: 1382 cells/uL (ref 850–3900)
MCH: 32 pg (ref 27.0–33.0)
MCHC: 34.6 g/dL (ref 32.0–36.0)
MCV: 92.4 fL (ref 80.0–100.0)
MPV: 12 fL (ref 7.5–12.5)
Monocytes Relative: 8.4 %
Neutro Abs: 4301 cells/uL (ref 1500–7800)
Neutrophils Relative %: 67.2 %
Platelets: 240 10*3/uL (ref 140–400)
RBC: 4.47 10*6/uL (ref 4.20–5.80)
RDW: 12.9 % (ref 11.0–15.0)
Total Lymphocyte: 21.6 %
WBC: 6.4 10*3/uL (ref 3.8–10.8)

## 2020-02-18 LAB — LIPID PANEL
Cholesterol: 179 mg/dL (ref <200)
HDL: 47 mg/dL (ref >=40)
LDL Cholesterol (Calc): 100 mg/dL (calc) — ABNORMAL HIGH
Non-HDL Cholesterol (Calc): 132 mg/dL (calc) — ABNORMAL HIGH (ref <130)
Total CHOL/HDL Ratio: 3.8 (calc) (ref <5.0)
Triglycerides: 197 mg/dL — ABNORMAL HIGH (ref <150)

## 2020-02-18 LAB — MICROALBUMIN / CREATININE URINE RATIO
Creatinine, Urine: 140 mg/dL (ref 20–320)
Microalb, Ur: <0.2 mg/dL

## 2020-02-18 LAB — VITAMIN D 25 HYDROXY (VIT D DEFICIENCY, FRACTURES): Vit D, 25-Hydroxy: 60 ng/mL (ref 30–100)

## 2020-02-18 LAB — TSH: TSH: 2.41 mIU/L (ref 0.40–4.50)

## 2020-02-18 LAB — MAGNESIUM: Magnesium: 1.9 mg/dL (ref 1.5–2.5)

## 2020-02-18 LAB — INSULIN, RANDOM: Insulin: 12.2 u[IU]/mL

## 2020-02-18 LAB — PSA: PSA: 0.68 ng/mL (ref <=4.0)

## 2020-02-18 NOTE — Progress Notes (Signed)
========================================================== - Test results slightly outside the reference range are not unusual. If there is anything important, I will review this with you,  otherwise it is considered normal test values.  If you have further questions,  please do not hesitate to contact me at the office or via My Chart.  ========================================================== ==========================================================  -  PSA - Very Low - Great  ==========================================================  -  Total Chol = 179 - Great ,   - But   - LDL Chol = 100 - still high  (Ideal or Goal is less than 70 )   - and   - Triglycerides (   197   ) or fats in blood are too high  (goal is less than 150)    - Recommend avoid fried & greasy foods,  sweets / candy,   - Avoid white rice  (brown or wild rice or Quinoa is OK),   - Avoid white potatoes  (sweet potatoes are OK)   - Avoid anything made from white flour  - bagels, doughnuts, rolls, buns, biscuits, white and   wheat breads, pizza crust and traditional  pasta made of white flour & egg white  - (vegetarian pasta or spinach or wheat pasta is OK).    - Multi-grain bread is OK - like multi-grain flat bread or  sandwich thins.   - Avoid alcohol in excess.   - Exercise is also important. ==========================================================  -  A1c  - Worse - up from 5.5% . . . . Marland Kitchen to now 6.1%    Being diabetic has a  300% increased risk for heart attack,  stroke, cancer, and alzheimer- type vascular dementia.   It is very important that you work harder with diet by  avoiding all foods that are white except chicken,   fish & calliflower.  - Avoid white rice  (brown & wild rice is OK),   - Avoid white potatoes  (sweet potatoes in moderation is OK),   - White bread or wheat bread or anything made out of   white flour like bagels, donuts, rolls, buns, biscuits,  cakes,  - pastries, cookies, pizza crust, and pasta (made from  white flour & egg whites)   - vegetarian pasta or spinach or wheat pasta is OK.  - Multigrain breads like Arnold's, Pepperidge Farm or   multigrain sandwich thins or high fiber breads like   [place] bread or "Dave's Killer" breads that are  4 to 5 grams fiber per slice !  are best.    - Diet, exercise and weight loss can reverse and cure  diabetes in the early stages.    - Diet, exercise and weight loss is very important in the   control and prevention of complications of diabetes which  affects every system in your body, ie.   -Brain - dementia/stroke,  - eyes - glaucoma/blindness,  - heart - heart attack/heart failure,  - kidneys - dialysis,  - stomach - gastric paralysis,  - intestines - malabsorption,  - nerves - severe painful neuritis,  - circulation - gangrene & loss of a leg(s)  - and finally  . . . . . . . . . . . . . . . . . .    - cancer and Alzheimers. ========================================================== ==========================================================  - Vitamin D = 60 - Great  ==========================================================  -  All Else - CBC - Kidneys - Electrolytes - Liver - Magnesium & Thyroid    - all  Normal /  OK ===========================================================  ===========================================================

## 2020-03-12 DIAGNOSIS — R1032 Left lower quadrant pain: Secondary | ICD-10-CM | POA: Diagnosis not present

## 2020-03-12 DIAGNOSIS — R1031 Right lower quadrant pain: Secondary | ICD-10-CM | POA: Diagnosis not present

## 2020-03-25 DIAGNOSIS — L578 Other skin changes due to chronic exposure to nonionizing radiation: Secondary | ICD-10-CM | POA: Diagnosis not present

## 2020-03-25 DIAGNOSIS — L821 Other seborrheic keratosis: Secondary | ICD-10-CM | POA: Diagnosis not present

## 2020-03-25 DIAGNOSIS — Z86006 Personal history of melanoma in-situ: Secondary | ICD-10-CM | POA: Diagnosis not present

## 2020-03-25 DIAGNOSIS — L814 Other melanin hyperpigmentation: Secondary | ICD-10-CM | POA: Diagnosis not present

## 2020-03-25 DIAGNOSIS — Z85828 Personal history of other malignant neoplasm of skin: Secondary | ICD-10-CM | POA: Diagnosis not present

## 2020-03-25 DIAGNOSIS — D225 Melanocytic nevi of trunk: Secondary | ICD-10-CM | POA: Diagnosis not present

## 2020-03-25 DIAGNOSIS — L570 Actinic keratosis: Secondary | ICD-10-CM | POA: Diagnosis not present

## 2020-04-14 DIAGNOSIS — H52223 Regular astigmatism, bilateral: Secondary | ICD-10-CM | POA: Diagnosis not present

## 2020-05-07 ENCOUNTER — Other Ambulatory Visit: Payer: Self-pay | Admitting: Internal Medicine

## 2020-05-07 DIAGNOSIS — F419 Anxiety disorder, unspecified: Secondary | ICD-10-CM

## 2020-05-07 MED ORDER — ALPRAZOLAM 0.5 MG PO TABS: ORAL_TABLET | ORAL | 0 refills | Status: DC

## 2020-05-20 ENCOUNTER — Other Ambulatory Visit: Payer: Self-pay

## 2020-05-20 ENCOUNTER — Ambulatory Visit (INDEPENDENT_AMBULATORY_CARE_PROVIDER_SITE_OTHER): Payer: PPO | Admitting: Adult Health

## 2020-05-20 ENCOUNTER — Encounter: Payer: Self-pay | Admitting: Adult Health

## 2020-05-20 VITALS — BP 122/80 | HR 88 | Temp 97.5°F | Wt 193.4 lb

## 2020-05-20 DIAGNOSIS — I10 Essential (primary) hypertension: Secondary | ICD-10-CM | POA: Diagnosis not present

## 2020-05-20 DIAGNOSIS — Z0001 Encounter for general adult medical examination with abnormal findings: Secondary | ICD-10-CM

## 2020-05-20 DIAGNOSIS — Z86010 Personal history of colonic polyps: Secondary | ICD-10-CM

## 2020-05-20 DIAGNOSIS — I700 Atherosclerosis of aorta: Secondary | ICD-10-CM

## 2020-05-20 DIAGNOSIS — E663 Overweight: Secondary | ICD-10-CM

## 2020-05-20 DIAGNOSIS — K219 Gastro-esophageal reflux disease without esophagitis: Secondary | ICD-10-CM | POA: Diagnosis not present

## 2020-05-20 DIAGNOSIS — G4700 Insomnia, unspecified: Secondary | ICD-10-CM

## 2020-05-20 DIAGNOSIS — E782 Mixed hyperlipidemia: Secondary | ICD-10-CM | POA: Diagnosis not present

## 2020-05-20 DIAGNOSIS — R7309 Other abnormal glucose: Secondary | ICD-10-CM

## 2020-05-20 DIAGNOSIS — I25810 Atherosclerosis of coronary artery bypass graft(s) without angina pectoris: Secondary | ICD-10-CM

## 2020-05-20 DIAGNOSIS — E559 Vitamin D deficiency, unspecified: Secondary | ICD-10-CM

## 2020-05-20 DIAGNOSIS — Z79899 Other long term (current) drug therapy: Secondary | ICD-10-CM

## 2020-05-20 DIAGNOSIS — Z0000 Encounter for general adult medical examination without abnormal findings: Secondary | ICD-10-CM

## 2020-05-20 DIAGNOSIS — R6889 Other general symptoms and signs: Secondary | ICD-10-CM

## 2020-05-20 DIAGNOSIS — R972 Elevated prostate specific antigen [PSA]: Secondary | ICD-10-CM

## 2020-05-20 MED ORDER — ATORVASTATIN CALCIUM 80 MG PO TABS: ORAL_TABLET | ORAL | 3 refills | Status: DC

## 2020-05-20 NOTE — Progress Notes (Signed)
MEDICARE ANNUAL WELLNESS VISIT AND 3MOV  Assessment:    Encounter for Medicare annual wellness exam 1 year  Aortic atherosclerosis ([place]) Control blood pressure, cholesterol, glucose, increase exercise.   Essential hypertension - continue medications, DASH diet, exercise and monitor at home. Call if greater than 130/80.  -     CBC with Differential/Platelet -     COMPLETE METABOLIC PANEL WITH GFR -     TSH  Hyperlipidemia, mixed check lipids decrease fatty foods increase activity.  -     Lipid panel  Atherosclerosis of coronary artery bypass graft of native heart without angina pectoris Control blood pressure, cholesterol, glucose, increase exercise.   Gastroesophageal reflux disease without esophagitis Continue PPI/H2 blocker, diet discussed  Abnormal glucose (prediabetes) Discussed disease progression and risks Discussed diet/exercise, weight management and risk modification Check A1C q60m; check CMP today   Medication management -     Magnesium  Vitamin D deficiency At goal at recent check; continue to recommend supplementation for goal of 60-100 Defer vitamin D level  Overweight (BMI 25.0-29.9) - long discussion about weight loss, diet, and exercise -recommended diet heavy in fruits and veggies and low in animal meats, cheeses, and dairy products  Insomnia, unspecified type Monitor; rare xanax   DVT, HX OF Monitor  Elevated PSA Monitor  Hx of adenomatous polyp of colon Normal 2019, follow up 2024  Hypothyroidism, unspecified type -     TSH    Over 40 minutes of exam, counseling, chart review and critical decision making was performed  Future Appointments  Date Time Provider [place]  08/24/2020  4:00 PM Unk Pinto, MD [facility] None  03/03/2021  2:00 PM Unk Pinto, MD [facility] None  05/20/2021  3:30 PM Liane Comber, NP [facility] None     Plan:   During the course of the visit the patient was educated and counseled  about appropriate screening and preventive services including:    Pneumococcal vaccine  Prevnar 13   Influenza vaccine  Td vaccine  Screening electrocardiogram  Bone densitometry screening  Colorectal cancer screening  Diabetes screening  Glaucoma screening  Nutrition counseling   Advanced directives: requested   Subjective:  [name] is a 65 y.o. male who presents for Medicare Annual Wellness Visit and 3 month follow up.   Brother is 44, has frontal temporal lobe dementia- has some paranoia- he is at [facility]. He has had increase stress with this.   Has insomnia, prescribed xanax 0.5 mg but takes infrequently, 1x/week on average.   BMI is Body mass index is 27.16 kg/m., he has been working on diet, not intentionally exercising but very active all day, around home and garage/yard.  Wt Readings from Last 3 Encounters:  05/20/20 193 lb 6.4 oz (87.7 kg)  02/17/20 189 lb 3.2 oz (85.8 kg)  01/23/20 192 lb 9.6 oz (87.4 kg)   2008 s/p PCA/Stents with Dr. Harrington Challenger. Negative Cardiolite 2015. He has aortic atherosclerosis per CT 2020.   His blood pressure has been controlled at home, today their BP is BP: 122/80   He does not workout, does yard work. He denies chest pain, shortness of breath, dizziness.   He is on cholesterol medication (atoravstatin 80 mg daily) and denies myalgias. His cholesterol is at goal. Will forget to take occ.  The cholesterol last visit was:   Lab Results  Component Value Date   CHOL 179 02/17/2020   HDL 47 02/17/2020   LDLCALC 100 (H) 02/17/2020   LDLDIRECT  190.6 07/30/2010   TRIG 197 (H) 02/17/2020   CHOLHDL 3.8 02/17/2020    He has been working on diet and exercise for prediabetes, and denies polydipsia, polyuria and visual disturbances. Last A1C in the office was:  Lab Results  Component Value Date   HGBA1C 6.1 (H) 02/17/2020   He does push tea/water. Last GFR:  Lab Results  Component Value Date   GFRNONAA 50 (L)  02/17/2020   GFRNONAA 64 10/29/2019   GFRNONAA 62 07/24/2019   Patient is on Vitamin D supplement.   Lab Results  Component Value Date   VD25OH 60 02/17/2020     Hx of elevated PSA, now on finasteride with improved recently.  Lab Results  Component Value Date   PSA 0.68 02/17/2020   PSA 0.6 12/11/2018   PSA 1.1 10/23/2017     Medication Review:   Current Outpatient Medications (Cardiovascular):  .  lisinopril (ZESTRIL) 10 MG tablet, TAKE 1 TABLET DAILY FOR BLOOD PRESSURE .  sildenafil (REVATIO) 20 MG tablet, TAKE 1 TO 5 TABS BY MOUTH DAILY AS NEEDED. Marland Kitchen  atorvastatin (LIPITOR) 80 MG tablet, Take 1 tablet Daily for Cholesterol   Current Outpatient Medications (Analgesics):  .  aspirin EC 81 MG tablet, 81 mg daily.    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  Cholecalciferol (VITAMIN D) 125 MCG (5000 UT) CAPS, Take 1 capsule by mouth daily. .  cyclobenzaprine (FLEXERIL) 10 MG tablet, Take      1/2 to 1 tablet      3 x /day        as needed for Muscle Spasm .  esomeprazole (NEXIUM) 20 MG capsule, Take 20 mg by mouth daily at 12 noon. Takes OTC 20 mg .  finasteride (PROSCAR) 5 MG tablet, Take 1 table tDaily for Prostate (Patient taking differently: 2.5 mg. Take 1 table Daily for Prostate)  Allergies: Allergies  Allergen Reactions  . Penicillins     unspecified    Current Problems (verified) Patient Active Problem List   Diagnosis Date Noted  . Aortic atherosclerosis ([place]) by Abd CT 09/27/2018 03/27/2019  . Insomnia 02/21/2019  . Overweight (BMI 25.0-29.9) 03/29/2017  . Elevated PSA 09/16/2015  . Abnormal glucose 07/24/2013  . Vitamin D deficiency 07/24/2013  . Medication management 07/24/2013  . Hx of adenomatous polyp of colon 06/09/2010  . Essential hypertension 08/05/2008  . Hyperlipidemia, mixed  08/02/2006  . ASCAD s/p PTCA/Stent (2005)  08/02/2006  . Gastroesophageal reflux disease 08/02/2006  . DVT, HX OF 08/02/2006    Screening Tests Immunization History  Administered Date(s) Administered  . Influenza,inj,Quad PF,6+ Mos 12/07/2016, 11/21/2018  . Influenza-Unspecified 01/28/2018  . Moderna Sars-Covid-2 Vaccination 03/01/2019, 04/05/2019, 01/27/2020  . PPD Test 07/24/2013, 07/30/2014, 09/16/2016, 10/23/2017  . Tdap 07/30/2010   Preventative care: Last colonoscopy: Last Colon - 08/09/2017 - Dr Carlean Purl recc 5 yr f/u - July 2024  Prior vaccinations: TD or Tdap: 2012 will get PRN  Influenza: currently n/a Pneumococcal: Declines Shingles: declines Covid 19: 3/3, 2021, moderna  Last eye: [facility], last 2022, new glasses Last dental: Dr. Geroge Baseman, last 2020  Patient Care Team: Unk Pinto, MD as PCP - General (Internal Medicine)  Surgical: Past Surgical History:  Procedure Laterality Date  . COLONOSCOPY    . COLONOSCOPY W/ POLYPECTOMY  06/16/2010   diminutive adenoma, diverticulosis, hemorrhoids  . CORONARY ANGIOPLASTY WITH STENT PLACEMENT    . ESOPHAGOGASTRODUODENOSCOPY  06/16/2010   antral ulcers, esophageal stricture - dilated, duodenitis, hiatal hernia  . HERNIA REPAIR Right   . INGUINAL HERNIA REPAIR Right 11/05/2018   Procedure: OPEN RIGHT INGUINAL HERNIA REPAIR WITH MESH;  Surgeon: Coralie Keens, MD;  Location: [facility];  Service: General;  Laterality: Right;  TAP BLOCK  . PILONIDAL CYST EXCISION     teenager  . POLYPECTOMY    . UPPER GASTROINTESTINAL ENDOSCOPY     Family His family history includes Colon cancer in his father; Dementia in his mother. Social history  He reports that he has never smoked. He has never used smokeless tobacco. He reports current alcohol use. He reports that he does not use drugs.  MEDICARE WELLNESS OBJECTIVES: Physical activity: Current Exercise Habits: The patient has a physically strenuous job, but  has no regular exercise apart from work., Exercise limited by: None identified Cardiac risk factors: Cardiac Risk Factors include: advanced age (>62men, >49 women);dyslipidemia;hypertension;male gender Depression/mood screen:   Depression screen [facility] 2/9 05/20/2020  Decreased Interest 0  Down, Depressed, Hopeless 0  PHQ - 2 Score 0  Altered sleeping -  Tired, decreased energy -  Change in appetite -  Feeling bad or failure about yourself  -  Trouble concentrating -  Moving slowly or fidgety/restless -  Suicidal thoughts -  PHQ-9 Score -    ADLs:  In your present state of health, do you have any difficulty performing the following activities: 05/20/2020 02/16/2020  Hearing? N N  Vision? N N  Difficulty concentrating or making decisions? N N  Walking or climbing stairs? N N  Dressing or bathing? N N  Doing errands, shopping? N N  Some recent data might be hidden     Cognitive Testing  Alert? Yes  Normal Appearance?Yes  Oriented to person? Yes  Place? Yes   Time? Yes  Recall of three objects?  Yes  Can perform simple calculations? Yes  Displays appropriate judgment?Yes  Can read the correct time from a watch face?Yes  EOL planning: Does Patient Have a Medical Advance Directive?: Yes Type of Advance Directive: [place] will Does patient want to make changes to medical advance directive?: No - Patient declined Copy of [place] in Chart?: No - copy requested  Review of Systems  Constitutional: Negative for malaise/fatigue and weight loss.  HENT: Negative for hearing loss and tinnitus.   Eyes: Negative for blurred vision and double vision.  Respiratory: Negative for cough, sputum production, shortness of breath and wheezing.   Cardiovascular: Negative for chest pain, palpitations, orthopnea, claudication, leg swelling and PND.  Gastrointestinal: Negative for abdominal pain, blood in stool, constipation, diarrhea, heartburn, melena,  nausea and vomiting.  Genitourinary: Negative.   Musculoskeletal: Negative for falls, joint pain and myalgias.  Skin: Negative for rash.  Neurological: Negative for dizziness, tingling, sensory change, weakness and headaches.  Endo/Heme/Allergies: Negative for polydipsia.  Psychiatric/Behavioral: Negative.  Negative for depression, memory loss, substance abuse and suicidal ideas. The patient is not nervous/anxious and does not have insomnia.   All other systems reviewed and are negative.    Objective:     Today's Vitals   05/20/20 1534  BP: 122/80  Pulse: 88  Temp: (!) 97.5 F (36.4 C)  SpO2: 98%  Weight: 193 lb 6.4 oz (87.7 kg)   Body mass index is 27.16 kg/m.  General appearance:  alert, no distress, WD/WN, male HEENT: normocephalic, sclerae anicteric, TMs pearly, nares patent, no discharge or erythema, pharynx normal Oral cavity: MMM, no lesions Neck: supple, no lymphadenopathy, no thyromegaly, no masses Heart: RRR, normal S1, S2, no murmurs Lungs: CTA bilaterally, no wheezes, rhonchi, or rales Abdomen: +bs, soft, non tender, non distended, no masses, no hepatomegaly, no splenomegaly Musculoskeletal: nontender, no swelling, no obvious deformity Extremities: no edema, no cyanosis, no clubbing Pulses: 2+ symmetric, upper and lower extremities, normal cap refill Neurological: alert, oriented x 3, CN2-12 intact, strength normal upper extremities and lower extremities, sensation normal throughout, DTRs 2+ throughout, no cerebellar signs, gait Normal Psychiatric: normal affect, behavior normal, pleasant   Medicare Attestation I have personally reviewed: The patient's medical and social history Their use of alcohol, tobacco or illicit drugs Their current medications and supplements The patient's functional ability including ADLs,fall risks, home safety risks, cognitive, and hearing and visual impairment Diet and physical activities Evidence for depression or mood  disorders  The patient's weight, height, BMI, and visual acuity have been recorded in the chart.  I have made referrals, counseling, and provided education to the patient based on review of the above and I have provided the patient with a written personalized care plan for preventive services.     Izora Ribas, NP   05/20/2020

## 2020-05-20 NOTE — Patient Instructions (Addendum)
[name] , Thank you for taking time to come for your Annual Wellness Visit. I appreciate your ongoing commitment to your health goals. Please review the following plan we discussed and let me know if I can assist you in the future.   These are the goals we discussed: Goals    . Exercise 150 min/wk Moderate Activity    . HEMOGLOBIN A1C < 5.7       This is a list of the screening recommended for you and due dates:  Health Maintenance  Topic Date Due  . Pneumonia vaccines (1 of 2 - PCV13) Never done  . COVID-19 Vaccine (4 - Booster for Moderna series) 07/27/2020  . Tetanus Vaccine  07/29/2020  . Flu Shot  09/07/2020  . Colon Cancer Screening  08/10/2022  .  Hepatitis C: One time screening is recommended by Center for Disease Control  (CDC) for  adults born from [birth year] through 1967.   Completed  . HPV Vaccine  Aged Out      High-Fiber Eating Plan Fiber, also called dietary fiber, is a type of carbohydrate. It is found foods such as fruits, vegetables, whole grains, and beans. A high-fiber diet can have many health benefits. Your health care provider may recommend a high-fiber diet to help:  Prevent constipation. Fiber can make your bowel movements more regular.  Lower your cholesterol.  Relieve the following conditions: ? Inflammation of veins in the anus (hemorrhoids). ? Inflammation of specific areas of the digestive tract (uncomplicated diverticulosis). ? A problem of the large intestine, also called the colon, that sometimes causes pain and diarrhea (irritable bowel syndrome, or IBS).  Prevent overeating as part of a weight-loss plan.  Prevent heart disease, type 2 diabetes, and certain cancers. What are tips for following this plan? Reading food labels  Check the nutrition facts label on food products for the amount of dietary fiber. Choose foods that have 5 grams of fiber or more per serving.  The goals for recommended daily fiber intake include: ? Men (age 33 or  younger): 34-38 g. ? Men (over age 42): 28-34 g. ? Women (age 35 or younger): 25-28 g. ? Women (over age 14): 22-25 g. Your daily fiber goal is _____________ g.   Shopping  Choose whole fruits and vegetables instead of processed forms, such as apple juice or applesauce.  Choose a wide variety of high-fiber foods such as avocados, lentils, oats, and kidney beans.  Read the nutrition facts label of the foods you choose. Be aware of foods with added fiber. These foods often have high sugar and sodium amounts per serving. Cooking  Use whole-grain flour for baking and cooking.  Cook with brown rice instead of white rice. Meal planning  Start the day with a breakfast that is high in fiber, such as a cereal that contains 5 g of fiber or more per serving.  Eat breads and cereals that are made with whole-grain flour instead of refined flour or white flour.  Eat brown rice, bulgur wheat, or millet instead of white rice.  Use beans in place of meat in soups, salads, and pasta dishes.  Be sure that half of the grains you eat each day are whole grains. General information  You can get the recommended daily intake of dietary fiber by: ? Eating a variety of fruits, vegetables, grains, nuts, and beans. ? Taking a fiber supplement if you are not able to take in enough fiber in your diet. It is better to get  fiber through food than from a supplement.  Gradually increase how much fiber you consume. If you increase your intake of dietary fiber too quickly, you may have bloating, cramping, or gas.  Drink plenty of water to help you digest fiber.  Choose high-fiber snacks, such as berries, raw vegetables, nuts, and popcorn. What foods should I eat? Fruits Berries. Pears. Apples. Oranges. Avocado. Prunes and raisins. Dried figs. Vegetables Sweet potatoes. Spinach. Kale. Artichokes. Cabbage. Broccoli. Cauliflower. Green peas. Carrots. Squash. Grains Whole-grain breads. Multigrain cereal. Oats  and oatmeal. Brown rice. Barley. Bulgur wheat. Panola. Quinoa. Bran muffins. Popcorn. Rye wafer crackers. Meats and other proteins Navy beans, kidney beans, and pinto beans. Soybeans. Split peas. Lentils. Nuts and seeds. Dairy Fiber-fortified yogurt. Beverages Fiber-fortified soy milk. Fiber-fortified orange juice. Other foods Fiber bars. The items listed above may not be a complete list of recommended foods and beverages. Contact a dietitian for more information. What foods should I avoid? Fruits Fruit juice. Cooked, strained fruit. Vegetables Fried potatoes. Canned vegetables. Well-cooked vegetables. Grains White bread. Pasta made with refined flour. White rice. Meats and other proteins Fatty cuts of meat. Fried chicken or fried fish. Dairy Milk. Yogurt. Cream cheese. Sour cream. Fats and oils Butters. Beverages Soft drinks. Other foods Cakes and pastries. The items listed above may not be a complete list of foods and beverages to avoid. Talk with your dietitian about what choices are best for you. Summary  Fiber is a type of carbohydrate. It is found in foods such as fruits, vegetables, whole grains, and beans.  A high-fiber diet has many benefits. It can help to prevent constipation, lower blood cholesterol, aid weight loss, and reduce your risk of heart disease, diabetes, and certain cancers.  Increase your intake of fiber gradually. Increasing fiber too quickly may cause cramping, bloating, and gas. Drink plenty of water while you increase the amount of fiber you consume.  The best sources of fiber include whole fruits and vegetables, whole grains, nuts, seeds, and beans. This information is not intended to replace advice given to you by your health care provider. Make sure you discuss any questions you have with your health care provider. Document Revised: 05/30/2019 Document Reviewed: 05/30/2019 Elsevier Patient Education  2021 [employer].

## 2020-05-21 LAB — CBC WITH DIFFERENTIAL/PLATELET
Absolute Monocytes: 724 cells/uL (ref 200–950)
Basophils Absolute: 56 cells/uL (ref 0–200)
Basophils Relative: 0.6 %
Eosinophils Absolute: 301 cells/uL (ref 15–500)
Eosinophils Relative: 3.2 %
HCT: 42.2 % (ref 38.5–50.0)
Hemoglobin: 13.9 g/dL (ref 13.2–17.1)
Lymphs Abs: 1579 cells/uL (ref 850–3900)
MCH: 30.2 pg (ref 27.0–33.0)
MCHC: 32.9 g/dL (ref 32.0–36.0)
MCV: 91.5 fL (ref 80.0–100.0)
MPV: 11.9 fL (ref 7.5–12.5)
Monocytes Relative: 7.7 %
Neutro Abs: 6740 cells/uL (ref 1500–7800)
Neutrophils Relative %: 71.7 %
Platelets: 317 10*3/uL (ref 140–400)
RBC: 4.61 10*6/uL (ref 4.20–5.80)
RDW: 12.7 % (ref 11.0–15.0)
Total Lymphocyte: 16.8 %
WBC: 9.4 10*3/uL (ref 3.8–10.8)

## 2020-05-21 LAB — COMPLETE METABOLIC PANEL WITH GFR
AG Ratio: 2.3 (calc) (ref 1.0–2.5)
ALT: 17 U/L (ref 9–46)
AST: 13 U/L (ref 10–35)
Albumin: 4.4 g/dL (ref 3.6–5.1)
Alkaline phosphatase (APISO): 60 U/L (ref 35–144)
BUN: 20 mg/dL (ref 7–25)
CO2: 25 mmol/L (ref 20–32)
Calcium: 9.8 mg/dL (ref 8.6–10.3)
Chloride: 105 mmol/L (ref 98–110)
Creat: 1.09 mg/dL (ref 0.70–1.25)
GFR, Est African American: 82 mL/min/{1.73_m2} (ref >=60)
GFR, Est Non African American: 70 mL/min/{1.73_m2} (ref >=60)
Globulin: 1.9 g/dL (calc) (ref 1.9–3.7)
Glucose, Bld: 86 mg/dL (ref 65–99)
Potassium: 4.7 mmol/L (ref 3.5–5.3)
Sodium: 139 mmol/L (ref 135–146)
Total Bilirubin: 0.4 mg/dL (ref 0.2–1.2)
Total Protein: 6.3 g/dL (ref 6.1–8.1)

## 2020-05-21 LAB — TSH: TSH: 2.1 mIU/L (ref 0.40–4.50)

## 2020-05-21 LAB — LIPID PANEL
Cholesterol: 146 mg/dL (ref <200)
HDL: 48 mg/dL (ref >=40)
LDL Cholesterol (Calc): 77 mg/dL (calc)
Non-HDL Cholesterol (Calc): 98 mg/dL (calc) (ref <130)
Total CHOL/HDL Ratio: 3 (calc) (ref <5.0)
Triglycerides: 119 mg/dL (ref <150)

## 2020-05-21 LAB — MAGNESIUM: Magnesium: 2.2 mg/dL (ref 1.5–2.5)

## 2020-05-26 ENCOUNTER — Other Ambulatory Visit: Payer: Self-pay | Admitting: Internal Medicine

## 2020-08-04 ENCOUNTER — Other Ambulatory Visit: Payer: Self-pay | Admitting: Internal Medicine

## 2020-08-04 DIAGNOSIS — F419 Anxiety disorder, unspecified: Secondary | ICD-10-CM

## 2020-08-24 ENCOUNTER — Ambulatory Visit (INDEPENDENT_AMBULATORY_CARE_PROVIDER_SITE_OTHER): Payer: PPO | Admitting: Internal Medicine

## 2020-08-24 ENCOUNTER — Other Ambulatory Visit: Payer: Self-pay

## 2020-08-24 ENCOUNTER — Encounter: Payer: Self-pay | Admitting: Internal Medicine

## 2020-08-24 VITALS — BP 116/68 | HR 70 | Temp 97.7°F | Resp 16 | Ht 71.0 in | Wt 187.0 lb

## 2020-08-24 DIAGNOSIS — R7309 Other abnormal glucose: Secondary | ICD-10-CM

## 2020-08-24 DIAGNOSIS — M5432 Sciatica, left side: Secondary | ICD-10-CM | POA: Diagnosis not present

## 2020-08-24 DIAGNOSIS — E559 Vitamin D deficiency, unspecified: Secondary | ICD-10-CM

## 2020-08-24 DIAGNOSIS — I25810 Atherosclerosis of coronary artery bypass graft(s) without angina pectoris: Secondary | ICD-10-CM | POA: Diagnosis not present

## 2020-08-24 DIAGNOSIS — M5440 Lumbago with sciatica, unspecified side: Secondary | ICD-10-CM | POA: Diagnosis not present

## 2020-08-24 DIAGNOSIS — Z79899 Other long term (current) drug therapy: Secondary | ICD-10-CM | POA: Diagnosis not present

## 2020-08-24 DIAGNOSIS — E782 Mixed hyperlipidemia: Secondary | ICD-10-CM | POA: Diagnosis not present

## 2020-08-24 DIAGNOSIS — I10 Essential (primary) hypertension: Secondary | ICD-10-CM

## 2020-08-24 MED ORDER — DEXAMETHASONE 4 MG PO TABS: ORAL_TABLET | ORAL | 0 refills | Status: DC

## 2020-08-24 MED ORDER — GABAPENTIN 100 MG PO CAPS: ORAL_CAPSULE | ORAL | 0 refills | Status: DC

## 2020-08-24 NOTE — Patient Instructions (Signed)

## 2020-08-24 NOTE — Progress Notes (Signed)
Future Appointments  Date Time Provider [place]  08/24/2020  4:00 PM Unk Pinto, MD [facility] None  03/03/2021  2:00 PM Unk Pinto, MD [facility] None  05/20/2021  3:30 PM Liane Comber, NP [facility] None    History of Present Illness:       This very nice 65 y.o. MWM  presents for 6  month follow up with HTN, HLD, Pre-Diabetes and Vitamin D Deficiency. Patient seems very frustrated tod with main c/o of LBP  (last 15 years) & Lt sciatica and insomnia. He is willing to re-try a steroid taper and a low dose od Gabapentin to help with paibn & sleep.        Patient is treated for HTN (2010) & BP has been controlled at home. Today's BP is at goal - 116/68. Patient has had no complaints of any cardiac type chest pain, palpitations, dyspnea / orthopnea / PND, dizziness, claudication, or dependent edema.       Hyperlipidemia is controlled with diet & meds. Patient denies myalgias or other med SE's. Last Lipids were at goal:  Lab Results  Component Value Date   CHOL 146 05/20/2020   HDL 48 05/20/2020   LDLCALC 77 05/20/2020   LDLDIRECT 190.6 07/30/2010   TRIG 119 05/20/2020   CHOLHDL 3.0 05/20/2020     Also, the patient has history of PreDiabetes and has had no symptoms of reactive hypoglycemia, diabetic polys, paresthesias or visual blurring.  Last A1c was not at goal:  Lab Results  Component Value Date   HGBA1C 6.1 (H) 02/17/2020                                                        Further, the patient also has history of Vitamin D Deficiency and supplements vitamin D without any suspected side-effects. Last vitamin D was at goal:  Lab Results  Component Value Date   VD25OH 60 02/17/2020     Current Outpatient Medications on File Prior to Visit  Medication Sig   ALPRAZolam (XANAX) 0.5 MG tablet TAKE 1/2 TO 1 TABLET AT BEDTIME IF NEEDED    aspirin EC 81 MG tablet  daily.    atorvastatin 80 MG tablet Take 1 tablet Daily    VITAMIN D  5000   u Take 1 capsule daily.   Esomeprazole 20 MG  Take 20 mg daily at 12 noon.    finasteride  5 MG  taking differently: 2.5 mg. Take 1 table Daily    lisinopril 10 MG  TAKE 1 TABLET  EVERY DAY   sildenafil  20 MG  TAKE 1 TO 5 TABS DAILY     Allergies  Allergen Reactions   Penicillins     unspecified    PMHx:   Past Medical History:  Diagnosis Date   ANXIETY 12/25/2007   ANXIETY STATE NEC 11/01/2006   CORONARY ARTERY DISEASE 08/02/2006   off Plavix x 3 years   DIVERTICULITIS OF COLON 01/06/2010   DVT, HX OF 08/02/2006   GERD with stricture 08/02/2006   History of thyroiditis 12/15/2018   Hx of adenomatous polyp of colon 06/09/2010   HYPERLIPIDEMIA 08/02/2006   HYPERTENSION 08/05/2008   Hypothyroidism 03/19/2014   Memory loss 07/21/2009   Pilonidal cyst    low back pain   Prostatitis  09/16/2015   Recurrent inguinal hernia    right     Immunization History  Administered Date(s) Administered   Influenza,inj,Quad PF,6+ Mos 12/07/2016, 11/21/2018   Influenza-Unspecified 01/28/2018   Moderna Sars-Covid-2 Vaccination 03/01/2019, 04/05/2019, 01/27/2020   PPD Test 07/24/2013, 07/30/2014, 09/16/2016, 10/23/2017   Tdap 07/30/2010     Past Surgical History:  Procedure Laterality Date   COLONOSCOPY     COLONOSCOPY W/ POLYPECTOMY  06/16/2010   diminutive adenoma, diverticulosis, hemorrhoids   CORONARY ANGIOPLASTY WITH STENT PLACEMENT     ESOPHAGOGASTRODUODENOSCOPY  06/16/2010   antral ulcers, esophageal stricture - dilated, duodenitis, hiatal hernia   HERNIA REPAIR Right    INGUINAL HERNIA REPAIR Right 11/05/2018   Procedure: OPEN RIGHT INGUINAL HERNIA REPAIR WITH MESH;  Surgeon: Coralie Keens, MD;  Location: [facility];  Service: General;  Laterality: Right;  TAP BLOCK   PILONIDAL CYST EXCISION     teenager   POLYPECTOMY     UPPER GASTROINTESTINAL ENDOSCOPY      FHx:    Reviewed / unchanged  SHx:    Reviewed / unchanged   Systems Review:  Constitutional: Denies  fever, chills, wt changes, headaches, insomnia, fatigue, night sweats, change in appetite. Eyes: Denies redness, blurred vision, diplopia, discharge, itchy, watery eyes.  ENT: Denies discharge, congestion, post nasal drip, epistaxis, sore throat, earache, hearing loss, dental pain, tinnitus, vertigo, sinus pain, snoring.  CV: Denies chest pain, palpitations, irregular heartbeat, syncope, dyspnea, diaphoresis, orthopnea, PND, claudication or edema. Respiratory: denies cough, dyspnea, DOE, pleurisy, hoarseness, laryngitis, wheezing.  Gastrointestinal: Denies dysphagia, odynophagia, heartburn, reflux, water brash, abdominal pain or cramps, nausea, vomiting, bloating, diarrhea, constipation, hematemesis, melena, hematochezia  or hemorrhoids. Genitourinary: Denies dysuria, frequency, urgency, nocturia, hesitancy, discharge, hematuria or flank pain. Musculoskeletal: Denies arthralgias, myalgias, stiffness, jt. swelling, pain, limping or strain/sprain.  Skin: Denies pruritus, rash, hives, warts, acne, eczema or change in skin lesion(s). Neuro: No weakness, tremor, incoordination, spasms, paresthesia or pain. Psychiatric: Denies confusion, memory loss or sensory loss. Endo: Denies change in weight, skin or hair change.  Heme/Lymph: No excessive bleeding, bruising or enlarged lymph nodes.  Physical Exam  BP 116/68   Pulse 70   Temp 97.7 F (36.5 C)   Resp 16   Ht 5\' 11"  (1.803 m)   Wt 187 lb (84.8 kg)   SpO2 96%   BMI 26.08 kg/m   Appears  well nourished, well groomed  and in no distress.  Eyes: PERRLA, EOMs, conjunctiva no swelling or erythema. Sinuses: No frontal/maxillary tenderness ENT/Mouth: EAC's clear, TM's nl w/o erythema, bulging. Nares clear w/o erythema, swelling, exudates. Oropharynx clear without erythema or exudates. Oral hygiene is good. Tongue normal, non obstructing. Hearing intact.  Neck: Supple. Thyroid not palpable. Car 2+/2+ without bruits, nodes or JVD. Chest:  Respirations nl with BS clear & equal w/o rales, rhonchi, wheezing or stridor.  Cor: Heart sounds normal w/ regular rate and rhythm without sig. murmurs, gallops, clicks or rubs. Peripheral pulses normal and equal  without edema.  Abdomen: Soft & bowel sounds normal. Non-tender w/o guarding, rebound, hernias, masses or organomegaly.  Lymphatics: Unremarkable.  Musculoskeletal: Full ROM all peripheral extremities, joint stability, 5/5 strength and normal gait.  Skin: Warm, dry without exposed rashes, lesions or ecchymosis apparent.  Neuro: Cranial nerves intact, reflexes equal bilaterally. Sensory-motor testing grossly intact. Tendon reflexes grossly intact.  Pysch: Alert & oriented x 3.  Insight and judgement nl & appropriate. No ideations.  Assessment and Plan:  - Continue medication, monitor blood pressure  at home.  - Continue DASH diet.  Reminder to go to the ER if any CP,  SOB, nausea, dizziness, severe HA, changes vision/speech.  - Continue diet/meds, exercise,& lifestyle modifications.  - Continue monitor periodic cholesterol/liver & renal functions    - Continue diet, exercise  - Lifestyle modifications.  - Monitor appropriate labs. - Continue supplementation.        Discussed  regular exercise, BP monitoring, weight control to achieve/maintain BMI less than 25 and discussed med and SE's. Recommended labs to assess and monitor clinical status with further disposition pending results of labs.  I discussed the assessment and treatment plan with the patient. The patient was provided an opportunity to ask questions and all were answered. The patient agreed with the plan and demonstrated an understanding of the instructions.  I provided over 30 minutes of exam, counseling, chart review and  complex critical decision making.        The patient was advised to call back or seek an in-person evaluation if the symptoms worsen or if the condition fails to improve as anticipated.   Kirtland Bouchard, MD

## 2020-08-25 LAB — TSH: TSH: 2.47 mIU/L (ref 0.40–4.50)

## 2020-08-25 LAB — HEMOGLOBIN A1C
Hgb A1c MFr Bld: 5.6 % of total Hgb (ref <5.7)
Mean Plasma Glucose: 114 mg/dL
eAG (mmol/L): 6.3 mmol/L

## 2020-08-25 LAB — CBC WITH DIFFERENTIAL/PLATELET
Absolute Monocytes: 781 cells/uL (ref 200–950)
Basophils Absolute: 42 cells/uL (ref 0–200)
Basophils Relative: 0.5 %
Eosinophils Absolute: 168 cells/uL (ref 15–500)
Eosinophils Relative: 2 %
HCT: 40.2 % (ref 38.5–50.0)
Hemoglobin: 13.9 g/dL (ref 13.2–17.1)
Lymphs Abs: 1823 cells/uL (ref 850–3900)
MCH: 31.5 pg (ref 27.0–33.0)
MCHC: 34.6 g/dL (ref 32.0–36.0)
MCV: 91.2 fL (ref 80.0–100.0)
MPV: 12.3 fL (ref 7.5–12.5)
Monocytes Relative: 9.3 %
Neutro Abs: 5586 cells/uL (ref 1500–7800)
Neutrophils Relative %: 66.5 %
Platelets: 281 10*3/uL (ref 140–400)
RBC: 4.41 10*6/uL (ref 4.20–5.80)
RDW: 13.1 % (ref 11.0–15.0)
Total Lymphocyte: 21.7 %
WBC: 8.4 10*3/uL (ref 3.8–10.8)

## 2020-08-25 LAB — COMPLETE METABOLIC PANEL WITH GFR
AG Ratio: 2 (calc) (ref 1.0–2.5)
ALT: 12 U/L (ref 9–46)
AST: 13 U/L (ref 10–35)
Albumin: 4.3 g/dL (ref 3.6–5.1)
Alkaline phosphatase (APISO): 54 U/L (ref 35–144)
BUN: 23 mg/dL (ref 7–25)
CO2: 25 mmol/L (ref 20–32)
Calcium: 9.7 mg/dL (ref 8.6–10.3)
Chloride: 106 mmol/L (ref 98–110)
Creat: 1.03 mg/dL (ref 0.70–1.35)
Globulin: 2.1 g/dL (calc) (ref 1.9–3.7)
Glucose, Bld: 91 mg/dL (ref 65–99)
Potassium: 4.1 mmol/L (ref 3.5–5.3)
Sodium: 139 mmol/L (ref 135–146)
Total Bilirubin: 0.4 mg/dL (ref 0.2–1.2)
Total Protein: 6.4 g/dL (ref 6.1–8.1)
eGFR: 80 mL/min/{1.73_m2} (ref >=60)

## 2020-08-25 LAB — MAGNESIUM: Magnesium: 2 mg/dL (ref 1.5–2.5)

## 2020-08-25 LAB — LIPID PANEL
Cholesterol: 167 mg/dL (ref <200)
HDL: 47 mg/dL (ref >=40)
LDL Cholesterol (Calc): 95 mg/dL (calc)
Non-HDL Cholesterol (Calc): 120 mg/dL (calc) (ref <130)
Total CHOL/HDL Ratio: 3.6 (calc) (ref <5.0)
Triglycerides: 156 mg/dL — ABNORMAL HIGH (ref <150)

## 2020-08-25 LAB — VITAMIN D 25 HYDROXY (VIT D DEFICIENCY, FRACTURES): Vit D, 25-Hydroxy: 73 ng/mL (ref 30–100)

## 2020-08-25 LAB — INSULIN, RANDOM: Insulin: 21.5 u[IU]/mL — ABNORMAL HIGH

## 2020-08-25 NOTE — Progress Notes (Signed)
============================================================ -   Test results slightly outside the reference range are not unusual. If there is anything important, I will review this with you,  otherwise it is considered normal test values.  If you have further questions,  please do not hesitate to contact me at the office or via My Chart.  ============================================================ ============================================================  -  Total Chol = 167    &   LDL Chol = 95   - Both  Excellent   - Very low risk for Heart Attack  / Stroke ============================================================ ============================================================  - A1c back in Normal NonDiabetic range - Great No Diabetes ! ============================================================ ============================================================  -  Vitamin D = 73 - Excell  - Vitamin D goal is between 70-100.   - It is very important as a natural anti-inflammatory and helping the  immune system protect against viral infections, like the Covid-19   helping hair, skin, and nails, as well as reducing stroke and  heart attack risk.   - It helps your bones and helps with mood.  - It also decreases numerous cancer risks so please  take it as directed.   - Low Vit D is associated with a 200-300% higher risk for  CANCER   and 200-300% higher risk for HEART   ATTACK  &  STROKE.    - It is also associated with higher death rate at younger ages,   autoimmune diseases like Rheumatoid arthritis, Lupus,  Multiple Sclerosis.     - Also many other serious conditions, like depression, Alzheimer's  Dementia, infertility, muscle aches, fatigue, fibromyalgia   - just to name a few. ============================================================ ============================================================  - All Else - CBC - Kidneys - Electrolytes - Liver - Magnesium &  Thyroid    - all  Normal / OK ============================================================ ============================================================  - Keep up the [place] Work  !  ============================================================ ============================================================

## 2020-09-15 ENCOUNTER — Other Ambulatory Visit: Payer: Self-pay | Admitting: Internal Medicine

## 2020-09-15 DIAGNOSIS — M5432 Sciatica, left side: Secondary | ICD-10-CM

## 2020-09-15 DIAGNOSIS — F419 Anxiety disorder, unspecified: Secondary | ICD-10-CM

## 2020-09-15 DIAGNOSIS — M5440 Lumbago with sciatica, unspecified side: Secondary | ICD-10-CM

## 2020-09-15 MED ORDER — DEXAMETHASONE 4 MG PO TABS: ORAL_TABLET | ORAL | 0 refills | Status: DC

## 2020-09-15 MED ORDER — ALPRAZOLAM 0.5 MG PO TABS: ORAL_TABLET | ORAL | 0 refills | Status: DC

## 2020-10-20 ENCOUNTER — Other Ambulatory Visit: Payer: Self-pay | Admitting: Internal Medicine

## 2020-10-20 DIAGNOSIS — F419 Anxiety disorder, unspecified: Secondary | ICD-10-CM

## 2020-10-20 MED ORDER — ALPRAZOLAM 0.5 MG PO TABS: ORAL_TABLET | ORAL | 0 refills | Status: DC

## 2020-11-09 ENCOUNTER — Other Ambulatory Visit: Payer: Self-pay

## 2020-11-09 ENCOUNTER — Ambulatory Visit (INDEPENDENT_AMBULATORY_CARE_PROVIDER_SITE_OTHER): Payer: PPO | Admitting: Internal Medicine

## 2020-11-09 ENCOUNTER — Encounter: Payer: Self-pay | Admitting: Internal Medicine

## 2020-11-09 VITALS — BP 138/82 | HR 85 | Temp 97.9°F | Resp 18 | Ht 71.0 in | Wt 182.0 lb

## 2020-11-09 DIAGNOSIS — F329 Major depressive disorder, single episode, unspecified: Secondary | ICD-10-CM

## 2020-11-09 DIAGNOSIS — G4483 Primary cough headache: Secondary | ICD-10-CM | POA: Diagnosis not present

## 2020-11-09 DIAGNOSIS — J029 Acute pharyngitis, unspecified: Secondary | ICD-10-CM | POA: Diagnosis not present

## 2020-11-09 LAB — POC COVID19 BINAXNOW: SARS Coronavirus 2 Ag: NEGATIVE

## 2020-11-09 MED ORDER — ESCITALOPRAM OXALATE 10 MG PO TABS: ORAL_TABLET | ORAL | 3 refills | Status: DC

## 2020-11-09 MED ORDER — DEXAMETHASONE 4 MG PO TABS: ORAL_TABLET | ORAL | 0 refills | Status: DC

## 2020-11-09 MED ORDER — AZITHROMYCIN 250 MG PO TABS: ORAL_TABLET | ORAL | 1 refills | Status: DC

## 2020-11-09 NOTE — Progress Notes (Signed)
Future Appointments  Date Time Provider [place]  03/03/2021  2:00 PM Unk Pinto, MD [facility] None  05/20/2021  3:30 PM Liane Comber, NP [facility] None    History of Present Illness:    Patient os a very nice 65 yo MWM with hx/o HTN, HLD, GERD , Vit D Def who presents with  hx/o of a Rt upper 1st molar dental extraction about 5 days ago. About 3 days ago he developed S/T, fever , chills, aches, hoarseness.     Unrelated to above, patient brought up about his son who has failed Drug rehabs several times & is entering a Suboxone clinic. He became tearful discussing his son's situation. Today patient seems amenable to trying an anti-depressant.   Medications    atorvastatin (LIPITOR) 80 MG tablet, Take 1 tablet Daily for Cholesterol   lisinopril (ZESTRIL) 10 MG tablet, TAKE 1 TABLET BY MOUTH EVERY DAY FOR BLOOD PRESSURE   sildenafil (REVATIO) 20 MG tablet, TAKE 1 TO 5 TABS BY MOUTH DAILY AS NEEDED.   aspirin EC 81 MG tablet, 81 mg daily.    ALPRAZolam (XANAX) 0.5 MG tablet, TAKE 1/2 TO 1 TABLET AT BEDTIME IF NEEDED FOR SLEEP. LIMIT 5 DAYS/WEEK (Dx:  g 47.00)   Cholecalciferol (VITAMIN D) 125 MCG (5000 UT) CAPS, Take 1 capsule by mouth daily.   esomeprazole (NEXIUM) 20 MG capsule, Take 20 mg by mouth daily at 12 noon. Takes OTC 20 mg   finasteride (PROSCAR) 5 MG tablet, Take 1 table tDaily for Prostate (Patient taking differently: 2.5 mg. Take 1 table Daily for Prostate)   gabapentin (NEURONTIN) 100 MG capsule, Take  1 capsule  3 x /day  as needed for Pain or Sleep  Problem list He has Hyperlipidemia, mixed; Essential hypertension; ASCAD s/p PTCA/Stent (2005) ; Gastroesophageal reflux disease; DVT, HX OF; Abnormal glucose; Vitamin D deficiency; Medication management; Elevated PSA; Overweight (BMI 25.0-29.9); Hx of adenomatous polyp of colon; Insomnia; and Aortic atherosclerosis ([place]) by Abd CT 09/27/2018 on their problem list.   Observations/Objective:  BP 138/82    Pulse 85   Temp 97.9 F (36.6 C)   Resp 18   Ht 5\' 11"  (1.803 m)   Wt 182 lb (82.6 kg)   SpO2 98%   BMI 25.38 kg/m   No Rash, cyanosis or stridor.   HEENT -  EAC's /TM's - Nl. No sinus tenderness.             - O/P 2+ injected post pharyngeal wall w/o exudates.  Neck - supple. Sl tender anterior cx lymphadenopathy.  Chest - Clear equal BS. Cor - Nl HS. RRR w/o sig MGR. PP 1(+). No edema. MS- FROM w/o deformities.  Gait Nl. Neuro -  Nl w/o focal abnormalities.  Assessment and Plan:   1. Pharyngitis  - dexamethasone 4 MG tablet;  Take 1 tab 3 x day - 3 days, then 2 x day - 3 days, then 1 tab daily   Dispense: 20 tablet - azithromycin  250 MG tablet; Take 2 tablets Day 1, then 1 tablet Daily  Dispense: 6 each; Refill: 1  2. Depression, reactive  - escitalopram (LEXAPRO) 10 MG tablet;  Take 1 tablet Daily for Mood   Dispense: 90 tablet; Refill: 3  3. Cough / headache  - POC COVID-19   Follow Up Instructions:        I discussed the assessment and treatment plan with the patient. The patient was provided an opportunity to ask questions and  all were answered. The patient agreed with the plan and demonstrated an understanding of the instructions.       The patient was advised to call back or seek an in-person evaluation if the symptoms worsen or if the condition fails to improve as anticipated.    Kirtland Bouchard, MD

## 2020-11-10 ENCOUNTER — Other Ambulatory Visit: Payer: Self-pay | Admitting: Internal Medicine

## 2020-11-10 DIAGNOSIS — F419 Anxiety disorder, unspecified: Secondary | ICD-10-CM

## 2020-11-28 ENCOUNTER — Other Ambulatory Visit: Payer: Self-pay | Admitting: Adult Health

## 2020-12-16 ENCOUNTER — Other Ambulatory Visit: Payer: Self-pay | Admitting: Internal Medicine

## 2020-12-16 DIAGNOSIS — F419 Anxiety disorder, unspecified: Secondary | ICD-10-CM

## 2020-12-16 MED ORDER — ALPRAZOLAM 0.5 MG PO TABS: ORAL_TABLET | ORAL | 0 refills | Status: DC

## 2020-12-17 ENCOUNTER — Other Ambulatory Visit: Payer: Self-pay | Admitting: Internal Medicine

## 2020-12-17 DIAGNOSIS — M5432 Sciatica, left side: Secondary | ICD-10-CM

## 2020-12-17 DIAGNOSIS — M5440 Lumbago with sciatica, unspecified side: Secondary | ICD-10-CM

## 2020-12-22 ENCOUNTER — Other Ambulatory Visit: Payer: Self-pay

## 2020-12-22 ENCOUNTER — Encounter: Payer: Self-pay | Admitting: Internal Medicine

## 2020-12-22 ENCOUNTER — Ambulatory Visit (INDEPENDENT_AMBULATORY_CARE_PROVIDER_SITE_OTHER): Payer: PPO | Admitting: Internal Medicine

## 2020-12-22 VITALS — BP 128/80 | HR 96 | Temp 98.6°F | Resp 18 | Ht 71.0 in | Wt 181.0 lb

## 2020-12-22 DIAGNOSIS — J101 Influenza due to other identified influenza virus with other respiratory manifestations: Secondary | ICD-10-CM | POA: Diagnosis not present

## 2020-12-22 DIAGNOSIS — G4483 Primary cough headache: Secondary | ICD-10-CM

## 2020-12-22 DIAGNOSIS — Z23 Encounter for immunization: Secondary | ICD-10-CM | POA: Diagnosis not present

## 2020-12-22 LAB — POC COVID19 BINAXNOW: SARS Coronavirus 2 Ag: NEGATIVE

## 2020-12-22 LAB — POCT INFLUENZA A/B
Influenza A, POC: POSITIVE — AB
Influenza B, POC: NEGATIVE

## 2020-12-22 MED ORDER — DEXAMETHASONE 4 MG PO TABS
ORAL_TABLET | ORAL | 0 refills | Status: DC
Start: 2020-12-22 — End: 2021-02-22

## 2020-12-22 NOTE — Progress Notes (Signed)
Future Appointments  Date Time Provider [place]  03/03/2021  2:00 PM Unk Pinto, MD [facility] None  05/20/2021  3:30 PM Liane Comber, NP [facility] None    History of Present Illness:     Patient is a very nice 65 yo MWM present with 5 day prodrome of head/chest congestion, non-productive cough, Nausea/ vomiting & severe myalgias  and fever. No fluvax (yet). Today tested (-) Negative for Covid  & (+) for Influenza A.   Medications   Current Outpatient Medications (Cardiovascular):    atorvastatin (LIPITOR) 80 MG tablet, Take 1 tablet Daily for Cholesterol   lisinopril (ZESTRIL) 10 MG tablet, Take 1 tablet  Daily for BP                        /            TAKE 1 TABLET BY MOUTH   sildenafil (REVATIO) 20 MG tablet, TAKE 1 TO 5 TABS BY MOUTH DAILY AS NEEDED.   Current Outpatient Medications (Analgesics):    aspirin EC 81 MG tablet, 81 mg daily.    Current Outpatient Medications (Other):    ALPRAZolam (XANAX) 0.5 MG tablet, TAKE 1/2 TO 1 TABLET AT BEDTIME IF NEEDED FOR SLEEP. LIMIT 5 DAYS/WEEK (Dx:  g 47.00)   Cholecalciferol (VITAMIN D) 125 MCG (5000 UT) CAPS, Take 1 capsule by mouth daily.   escitalopram (LEXAPRO) 10 MG tablet, Take 1 tablet Daily for Mood   esomeprazole (NEXIUM) 20 MG capsule, Take 20 mg by mouth daily at 12 noon. Takes OTC 20 mg   finasteride (PROSCAR) 5 MG tablet, TAKE 1 TABLE TDAILY FOR PROSTATE   gabapentin (NEURONTIN) 100 MG capsule, TAKE 1 CAPSULE 3 X DAY AS NEEDED FOR PAIN OR SLEEP  Problem list He has Hyperlipidemia, mixed; Essential hypertension; ASCAD s/p PTCA/Stent (2005) ; Gastroesophageal reflux disease; DVT, HX OF; Abnormal glucose; Vitamin D deficiency; Medication management; Elevated PSA; Overweight (BMI 25.0-29.9); Hx of adenomatous polyp of colon; Insomnia; and Aortic atherosclerosis ([place]) by Abd CT 09/27/2018 on their problem list.   Observations/Objective:  BP 128/80   Pulse 96   Temp 98.6 F (37 C)   Resp 18   Ht 5'  11" (1.803 m)   Wt 181 lb (82.1 kg)   SpO2 97%   BMI 25.24 kg/m   Dry cough. No Stridor.   HEENT - EACs/TMs - Nl . No sinus tenderness. N/O/P - clear  Neck - supple. No sig Cx LNs.  Chest - Clear equal BS. Few dry rales clear with cough.  Cor - Nl HS. RRR w/o sig MGR. PP 1(+). No edema. MS- FROM w/o deformities.  Gait Nl. Neuro -  Nl w/o focal abnormalities.  Assessment and Plan:   1. Influenza A  - dexamethasone (DECADRON) 4 MG tablet;  Take 1 tab 3 x day - 3 days, then 2 x day - 3 days, then 1 tab daily   Dispense: 20 tablet; Refill: 0  2. Cough headache  - POC COVID-19 - >Negative  - POCT Influenza A/B -> Positive for A   3. Need for immunization against influenza B  - Flu vaccine HIGH DOSE PF (Fluzone High dose)  Follow Up Instructions:        I discussed the assessment and treatment plan with the patient. The patient was provided an opportunity to ask questions and all were answered. The patient agreed with the plan and demonstrated an understanding of the instructions.  The patient was advised to call back or seek an in-person evaluation if the symptoms worsen or if the condition fails to improve as anticipated.   Kirtland Bouchard, MD

## 2020-12-22 NOTE — Patient Instructions (Signed)
Influenza, Adult °Influenza, also called "the flu," is a viral infection that mainly affects the respiratory tract. This includes the lungs, nose, and throat. The flu spreads easily from person to person (is contagious). It causes common cold symptoms, along with high fever and body aches. °What are the causes? °This condition is caused by the influenza virus. You can get the virus by: °Breathing in droplets that are in the air from an infected person's cough or sneeze. °Touching something that has the virus on it (has been contaminated) and then touching your mouth, nose, or eyes. °What increases the risk? °The following factors may make you more likely to get the flu: °Not washing or sanitizing your hands often. °Having close contact with many people during cold and flu season. °Touching your mouth, eyes, or nose without first washing or sanitizing your hands. °Not getting an annual flu shot. °You may have a higher risk for the flu, including serious problems, such as a lung infection (pneumonia), if you: °Are older than 65. °Are pregnant. °Have a weakened disease-fighting system (immune system). This includes people who have HIV or AIDS, are on chemotherapy, or are taking medicines that reduce (suppress) the immune system. °Have a long-term (chronic) illness, such as heart disease, kidney disease, diabetes, or lung disease. °Have a liver disorder. °Are severely overweight (morbidly obese). °Have anemia. °Have asthma. °What are the signs or symptoms? °Symptoms of this condition usually begin suddenly and last 4-14 days. These may include: °Fever and chills. °Headaches, body aches, or muscle aches. °Sore throat. °Cough. °Runny or stuffy (congested) nose. °Chest discomfort. °Poor appetite. °Weakness or fatigue. °Dizziness. °Nausea or vomiting. °How is this diagnosed? °This condition may be diagnosed based on: °Your symptoms and medical history. °A physical exam. °Swabbing your nose or throat and testing the fluid  for the influenza virus. °How is this treated? °If the flu is diagnosed early, you can be treated with antiviral medicine that is given by mouth (orally) or through an IV. This can help reduce how severe the illness is and how long it lasts. °Taking care of yourself at home can help relieve symptoms. Your health care provider may recommend: °Taking over-the-counter medicines. °Drinking plenty of fluids. °In many cases, the flu goes away on its own. If you have severe symptoms or complications, you may be treated in a hospital. °Follow these instructions at home: °Activity °Rest as needed and get plenty of sleep. °Stay home from work or school as told by your health care provider. Unless you are visiting your health care provider, avoid leaving home until your fever has been gone for 24 hours without taking medicine. °Eating and drinking °Take an oral rehydration solution (ORS). This is a drink that is sold at pharmacies and retail stores. °Drink enough fluid to keep your urine pale yellow. °Drink clear fluids in small amounts as you are able. Clear fluids include water, ice chips, fruit juice mixed with water, and low-calorie sports drinks. °Eat bland, easy-to-digest foods in small amounts as you are able. These foods include bananas, applesauce, rice, lean meats, toast, and crackers. °Avoid drinking fluids that contain a lot of sugar or caffeine, such as energy drinks, regular sports drinks, and soda. °Avoid alcohol. °Avoid spicy or fatty foods. °General instructions °  °Take over-the-counter and prescription medicines only as told by your health care provider. °Use a cool mist humidifier to add humidity to the air in your home. This can make it easier to breathe. °When using a cool mist humidifier,   clean it daily. Empty the water and replace it with clean water. °Cover your mouth and nose when you cough or sneeze. °Wash your hands with soap and water often and for at least 20 seconds, especially after you cough or  sneeze. If soap and water are not available, use alcohol-based hand sanitizer. °Keep all follow-up visits. This is important. °How is this prevented? ° °Get an annual flu shot. This is usually available in late summer, fall, or winter. Ask your health care provider when you should get your flu shot. °Avoid contact with people who are sick during cold and flu season. This is generally fall and winter. °Contact a health care provider if: °You develop new symptoms. °You have: °Chest pain. °Diarrhea. °A fever. °Your cough gets worse. °You produce more mucus. °You feel nauseous or you vomit. °Get help right away if you: °Develop shortness of breath or have difficulty breathing. °Have skin or nails that turn a bluish color. °Have severe pain or stiffness in your neck. °Develop a sudden headache or sudden pain in your face or ear. °Cannot eat or drink without vomiting. °These symptoms may represent a serious problem that is an emergency. Do not wait to see if the symptoms will go away. Get medical help right away. Call your local emergency services (911 in the [place] Do not drive yourself to the hospital. °Summary °Influenza, also called "the flu," is a viral infection that primarily affects your respiratory tract. °Symptoms of the flu usually begin suddenly and last 4-14 days. °Getting an annual flu shot is the best way to prevent getting the flu. °Stay home from work or school as told by your health care provider. Unless you are visiting your health care provider, avoid leaving home until your fever has been gone for 24 hours without taking medicine. °Keep all follow-up visits. This is important. °This information is not intended to replace advice given to you by your health care provider. Make sure you discuss any questions you have with your health care provider. °Document Revised: 09/13/2019 Document Reviewed: 09/13/2019 °Elsevier Patient Education © 2022 [employer] ° °

## 2020-12-29 ENCOUNTER — Ambulatory Visit
Admission: RE | Admit: 2020-12-29 | Discharge: 2020-12-29 | Disposition: A | Discharge status: Home / Self Care | Payer: PPO | Source: Ambulatory Visit | Attending: Internal Medicine | Admitting: Internal Medicine

## 2020-12-29 ENCOUNTER — Other Ambulatory Visit: Payer: Self-pay

## 2020-12-29 ENCOUNTER — Other Ambulatory Visit: Payer: Self-pay | Admitting: Internal Medicine

## 2020-12-29 DIAGNOSIS — R0602 Shortness of breath: Secondary | ICD-10-CM

## 2020-12-30 NOTE — Progress Notes (Signed)
============================================================ ============================================================  -    CXR  is perfectly clear - No sign of pneumonia, heart failure , blood clots  - Anxiety can cause chest tightness  - If symptoms persist, Then recommend an Office visit.   - bill mck

## 2021-01-05 ENCOUNTER — Ambulatory Visit: Payer: PPO | Admitting: Internal Medicine

## 2021-02-22 ENCOUNTER — Other Ambulatory Visit: Payer: Self-pay | Admitting: Internal Medicine

## 2021-02-22 DIAGNOSIS — J101 Influenza due to other identified influenza virus with other respiratory manifestations: Secondary | ICD-10-CM

## 2021-02-22 DIAGNOSIS — G4483 Primary cough headache: Secondary | ICD-10-CM

## 2021-03-03 ENCOUNTER — Other Ambulatory Visit: Payer: Self-pay

## 2021-03-03 ENCOUNTER — Encounter: Payer: Self-pay | Admitting: Internal Medicine

## 2021-03-03 ENCOUNTER — Ambulatory Visit (INDEPENDENT_AMBULATORY_CARE_PROVIDER_SITE_OTHER): Payer: PPO | Admitting: Internal Medicine

## 2021-03-03 VITALS — BP 124/86 | HR 72 | Temp 98.0°F | Resp 16 | Ht 71.0 in | Wt 191.2 lb

## 2021-03-03 DIAGNOSIS — Z8679 Personal history of other diseases of the circulatory system: Secondary | ICD-10-CM

## 2021-03-03 DIAGNOSIS — I700 Atherosclerosis of aorta: Secondary | ICD-10-CM | POA: Diagnosis not present

## 2021-03-03 DIAGNOSIS — Z0000 Encounter for general adult medical examination without abnormal findings: Secondary | ICD-10-CM

## 2021-03-03 DIAGNOSIS — I10 Essential (primary) hypertension: Secondary | ICD-10-CM | POA: Diagnosis not present

## 2021-03-03 DIAGNOSIS — Z1212 Encounter for screening for malignant neoplasm of rectum: Secondary | ICD-10-CM

## 2021-03-03 DIAGNOSIS — Z79899 Other long term (current) drug therapy: Secondary | ICD-10-CM

## 2021-03-03 DIAGNOSIS — Z8249 Family history of ischemic heart disease and other diseases of the circulatory system: Secondary | ICD-10-CM

## 2021-03-03 DIAGNOSIS — I2510 Atherosclerotic heart disease of native coronary artery without angina pectoris: Secondary | ICD-10-CM

## 2021-03-03 DIAGNOSIS — E559 Vitamin D deficiency, unspecified: Secondary | ICD-10-CM

## 2021-03-03 DIAGNOSIS — E782 Mixed hyperlipidemia: Secondary | ICD-10-CM

## 2021-03-03 DIAGNOSIS — R7309 Other abnormal glucose: Secondary | ICD-10-CM

## 2021-03-03 DIAGNOSIS — K219 Gastro-esophageal reflux disease without esophagitis: Secondary | ICD-10-CM

## 2021-03-03 DIAGNOSIS — N138 Other obstructive and reflux uropathy: Secondary | ICD-10-CM

## 2021-03-03 DIAGNOSIS — Z0001 Encounter for general adult medical examination with abnormal findings: Secondary | ICD-10-CM

## 2021-03-03 DIAGNOSIS — Z125 Encounter for screening for malignant neoplasm of prostate: Secondary | ICD-10-CM

## 2021-03-03 DIAGNOSIS — Z136 Encounter for screening for cardiovascular disorders: Secondary | ICD-10-CM

## 2021-03-03 DIAGNOSIS — F5101 Primary insomnia: Secondary | ICD-10-CM

## 2021-03-03 NOTE — Patient Instructions (Signed)

## 2021-03-03 NOTE — Progress Notes (Addendum)
Annual  Screening/Preventative Visit  & Comprehensive Evaluation & Examination  Future Appointments  Date Time Provider Department  03/03/2021     CPE  2:00 PM Unk Pinto, MD [facility]  05/20/2021    Wellness  3:30 PM Liane Comber, NP [facility]  03/08/2022  2:00 PM Unk Pinto, MD [facility]            This very nice 66 y.o. MWM presents for a Screening /Preventative Visit & comprehensive evaluation and management of multiple medical co-morbidities.  Patient has been followed for HTN, HLD, Prediabetes and Vitamin D Deficiency.  In Aug 2020, Abdominal CT scan showed Aortic Atherosclerosis .       HTN predates since 2008.  In 2008, patient had PCA/Stents x 2 by Dr Dorris Carnes. Patient's BP has been controlled at home.  Today's BP is at goal - 124/86.  In 2015 , Cardiolite was Negative. Patient denies any cardiac symptoms as chest pain, palpitations, shortness of breath, dizziness or ankle swelling.       Patient's hyperlipidemia is controlled with diet & Atorvastatin Patient denies myalgias or other medication SE's. Last lipids were  Lab Results  Component Value Date   CHOL 167 08/24/2020   HDL 47 08/24/2020   LDLCALC 95 08/24/2020   LDLDIRECT 190.6 07/30/2010   TRIG 156 (H) 08/24/2020   CHOLHDL 3.6 08/24/2020         Patient has hx/o prediabetes (A1c 6.1% /2014 & 6.3% /2015) and patient denies reactive hypoglycemic symptoms, visual blurring, diabetic polys or paresthesias. Last A1c was normal & at goal:  Lab Results  Component Value Date   HGBA1C 5.6 08/24/2020          Finally, patient has history of Vitamin D Deficiency ("43" /2014 & "30" /2016) and last vitamin D was at goal :   Lab Results  Component Value Date   VD25OH 73 08/24/2020     Current Outpatient Medications on File Prior to Visit  Medication Sig   ALPRAZolam  0.5 MG tablet TAKE 1/2 TO 1 TABLET AT BEDTIME IF NEEDED FOR SLEEP.   aspirin EC 81 MG tablet Take daily.    atorvastatin 80 MG  tablet Take 1 tablet Daily for Cholesterol   VITAMIN D  5000 u Take 1 capsule  daily.   escitalopram  10 MG tablet Take 1 tablet Daily for Mood   esomeprazole  20 MG capsule Take 20 mg  daily at 12 noon.    finasteride  5 MG tablet TAKE 1 TABLE T  DAILY FOR PROSTATE   gabapentin 100 MG capsule TAKE 1 CAPSULE 3 X DAY AS NEEDED    lisinopril 10 MG tablet Take 1 tablet  Daily for BP                         sildenafil 20 MG tablet TAKE 1 TO 5 TABS DAILY AS NEEDED.    Allergies  Allergen Reactions   Penicillins     Past Medical History:  Diagnosis Date   ANXIETY 12/25/2007   ANXIETY STATE NEC 11/01/2006   CORONARY ARTERY DISEASE 08/02/2006   off Plavix x 3 years   DIVERTICULITIS OF COLON 01/06/2010   DVT, HX OF 08/02/2006   GERD with stricture 08/02/2006   History of thyroiditis 12/15/2018   Hx of adenomatous polyp of colon 06/09/2010   HYPERLIPIDEMIA 08/02/2006   HYPERTENSION 08/05/2008   Hypothyroidism 03/19/2014   Memory loss 07/21/2009   Pilonidal cyst  low back pain   Prostatitis 09/16/2015   Recurrent inguinal hernia    right     Health Maintenance  Topic Date Due   Pneumonia Vaccine 62+ Years old (1 - PCV) Never done   Zoster Vaccines- Shingrix (1 of 2) Never done   COVID-19 Vaccine (4 - Booster for Moderna series) 03/23/2020   TETANUS/TDAP  07/29/2020   COLONOSCOPY  08/10/2022   INFLUENZA VACCINE  Completed   Hepatitis C Screening  Completed   HPV VACCINES  Aged Out     Immunization History  Administered Date(s) Administered   Influenza, High Dose  12/22/2020   Influenza,inj,Quad  12/07/2016, 11/21/2018   Influenza- 01/28/2018   Moderna Sars-Covid-2 Vacc 03/01/2019, 04/05/2019, 01/27/2020   PPD Test 09/16/2016, 10/23/2017   Tdap 07/30/2010    Last Colon - 08/09/2017 - Dr Carlean Purl recc 5 yr f/u - July 2024   Past Surgical History:  Procedure Laterality Date   COLONOSCOPY     COLONOSCOPY W/ POLYPECTOMY  06/16/2010   diminutive adenoma, diverticulosis,  hemorrhoids   CORONARY ANGIOPLASTY WITH STENT PLACEMENT     ESOPHAGOGASTRODUODENOSCOPY  06/16/2010   antral ulcers, esophageal stricture - dilated, duodenitis, hiatal hernia   HERNIA REPAIR Right    INGUINAL HERNIA REPAIR Right 11/05/2018   Procedure: OPEN RIGHT INGUINAL HERNIA REPAIR WITH MESH;  Surgeon: Coralie Keens, MD;  Location: [facility];  Service: General;  Laterality: Right;  TAP BLOCK   PILONIDAL CYST EXCISION     teenager   POLYPECTOMY     UPPER GASTROINTESTINAL ENDOSCOPY       Family History  Problem Relation Age of Onset   Colon cancer Father        deceased age 25   Dementia Mother    Breast cancer Neg Hx    Colon polyps Neg Hx    Esophageal cancer Neg Hx    Liver cancer Neg Hx    Pancreatic cancer Neg Hx    Rectal cancer Neg Hx    Stomach cancer Neg Hx      Social History   Tobacco Use   Smoking status: Never   Smokeless tobacco: Never  Vaping Use   Vaping Use: Never used  Substance Use Topics   Alcohol use: Yes    Comment: approx 2 drinks per week   Drug use: Never      ROS Constitutional: Denies fever, chills, weight loss/gain, headaches, insomnia,  night sweats or change in appetite. Does c/o fatigue. Eyes: Denies redness, blurred vision, diplopia, discharge, itchy or watery eyes.  ENT: Denies discharge, congestion, post nasal drip, epistaxis, sore throat, earache, hearing loss, dental pain, Tinnitus, Vertigo, Sinus pain or snoring.  Cardio: Denies chest pain, palpitations, irregular heartbeat, syncope, dyspnea, diaphoresis, orthopnea, PND, claudication or edema Respiratory: denies cough, dyspnea, DOE, pleurisy, hoarseness, laryngitis or wheezing.  Gastrointestinal: Denies dysphagia, heartburn, reflux, water brash, pain, cramps, nausea, vomiting, bloating, diarrhea, constipation, hematemesis, melena, hematochezia, jaundice or hemorrhoids Genitourinary: Denies dysuria, frequency, urgency, nocturia, hesitancy, discharge, hematuria or  flank pain Musculoskeletal: Denies arthralgia, myalgia, stiffness, Jt. Swelling, pain, limp or strain/sprain. Denies Falls. Skin: Denies puritis, rash, hives, warts, acne, eczema or change in skin lesion Neuro: No weakness, tremor, incoordination, spasms, paresthesia or pain Psychiatric: Denies confusion, memory loss or sensory loss. Denies Depression. Endocrine: Denies change in weight, skin, hair change, nocturia, and paresthesia, diabetic polys, visual blurring or hyper / hypo glycemic episodes.  Heme/Lymph: No excessive bleeding, bruising or enlarged lymph nodes.   Physical Exam  BP 124/86    Pulse 72    Temp 98 F (36.7 C)    Resp 16    Ht 5\' 11"  (1.803 m)    Wt 191 lb 3.2 oz (86.7 kg)    SpO2 98%    BMI 26.67 kg/m   General Appearance: Well nourished and well groomed and in no apparent distress.  Eyes: PERRLA, EOMs, conjunctiva no swelling or erythema, normal fundi and vessels. Sinuses: No frontal/maxillary tenderness ENT/Mouth: EACs patent / TMs  nl. Nares clear without erythema, swelling, mucoid exudates. Oral hygiene is good. No erythema, swelling, or exudate. Tongue normal, non-obstructing. Tonsils not swollen or erythematous. Hearing normal.  Neck: Supple, thyroid not palpable. No bruits, nodes or JVD. Respiratory: Respiratory effort normal.  BS equal and clear bilateral without rales, rhonci, wheezing or stridor. Cardio: Heart sounds are normal with regular rate and rhythm and no murmurs, rubs or gallops. Peripheral pulses are normal and equal bilaterally without edema. No aortic or femoral bruits. Chest: symmetric with normal excursions and percussion.  Abdomen: Soft, with Nl bowel sounds. Nontender, no guarding, rebound, hernias, masses, or organomegaly.  Lymphatics: Non tender without lymphadenopathy.  Musculoskeletal: Full ROM all peripheral extremities, joint stability, 5/5 strength, and normal gait. Skin: Warm and dry without rashes, lesions, cyanosis, clubbing or   ecchymosis.  Neuro: Cranial nerves intact, reflexes equal bilaterally. Normal muscle tone, no cerebellar symptoms. Sensation intact.  Pysch: Alert and oriented X 3 with normal affect, insight and judgment appropriate.   Assessment and Plan  1. Annual Preventative/Screening Exam    2. Essential hypertension  - EKG 12-Lead - [place], RETROPERITNL ABD,  LTD - Urinalysis, Routine w reflex microscopic - Microalbumin / creatinine urine ratio - CBC with Differential/Platelet - COMPLETE METABOLIC PANEL WITH GFR - Magnesium  3. Hyperlipidemia, mixed  - EKG 12-Lead - [place], RETROPERITNL ABD,  LTD - Lipid panel  4. Abnormal glucose  - EKG 12-Lead - [place], RETROPERITNL ABD,  LTD - Hemoglobin A1c - Insulin, random  5. Vitamin D deficiency  - VITAMIN D 25 Hydroxy   6. Aortic atherosclerosis ([place]) by Abd CT 09/27/2018  - EKG 12-Lead - [place], RETROPERITNL ABD,  LTD  7. History of ASCVD (atherosclerotic cardiovascular disease)  - EKG 12-Lead  8. Gastroesophageal reflux disease without esophagitis  - CBC with Differential/Platelet  9. BPH with obstruction/lower urinary tract symptoms  - PSA  10. Screening for colorectal cancer  - POC Hemoccult Bld/Stl   11. Prostate cancer screening - PSA  12. Screening for ischemic heart disease  - EKG 12-Lead  13. Screening for AAA (aortic abdominal aneurysm)  - [place], RETROPERITNL ABD,  LTD  14. FH: hypertension  - EKG 12-Lead - [place], RETROPERITNL ABD,  LTD  15. Arteriosclerotic cardiovascular disease (ASCVD)  - EKG 12-Lead  16. Medication management  - Urinalysis, Routine w reflex microscopic - Microalbumin / creatinine urine ratio - CBC with Differential/Platelet - COMPLETE METABOLIC PANEL WITH GFR - Magnesium - Lipid panel - TSH - Hemoglobin A1c - Insulin, random - VITAMIN D 25 Hydroxy   17. Primary insomnia  - hydrOXYzine (ATARAX) 25 MG tablet;  Take  1 to 2 tablets  1 hour  before Bedtime  as needed for Sleep    Dispense: 180 tablet; Refill: 3          Patient was counseled in prudent diet, weight control to achieve/maintain BMI less than 25, BP monitoring, regular exercise and medications as discussed.  Discussed med effects and SE's. Routine screening labs  and tests as requested with regular follow-up as recommended. Over 40 minutes of exam, counseling, chart review and high complex critical decision making was performed   Kirtland Bouchard, MD

## 2021-03-04 DIAGNOSIS — N401 Enlarged prostate with lower urinary tract symptoms: Secondary | ICD-10-CM | POA: Diagnosis not present

## 2021-03-04 DIAGNOSIS — Z79899 Other long term (current) drug therapy: Secondary | ICD-10-CM | POA: Diagnosis not present

## 2021-03-04 DIAGNOSIS — K219 Gastro-esophageal reflux disease without esophagitis: Secondary | ICD-10-CM | POA: Diagnosis not present

## 2021-03-04 DIAGNOSIS — E782 Mixed hyperlipidemia: Secondary | ICD-10-CM | POA: Diagnosis not present

## 2021-03-04 DIAGNOSIS — Z125 Encounter for screening for malignant neoplasm of prostate: Secondary | ICD-10-CM | POA: Diagnosis not present

## 2021-03-04 DIAGNOSIS — R7309 Other abnormal glucose: Secondary | ICD-10-CM | POA: Diagnosis not present

## 2021-03-04 DIAGNOSIS — N138 Other obstructive and reflux uropathy: Secondary | ICD-10-CM | POA: Diagnosis not present

## 2021-03-04 DIAGNOSIS — I10 Essential (primary) hypertension: Secondary | ICD-10-CM | POA: Diagnosis not present

## 2021-03-04 DIAGNOSIS — E559 Vitamin D deficiency, unspecified: Secondary | ICD-10-CM | POA: Diagnosis not present

## 2021-03-05 LAB — MICROALBUMIN / CREATININE URINE RATIO
Creatinine, Urine: 90 mg/dL (ref 20–320)
Microalb, Ur: <0.2 mg/dL

## 2021-03-05 LAB — URINALYSIS, ROUTINE W REFLEX MICROSCOPIC
Bilirubin Urine: NEGATIVE
Hgb urine dipstick: NEGATIVE
Ketones, ur: NEGATIVE
Leukocytes,Ua: NEGATIVE
Nitrite: NEGATIVE
Protein, ur: NEGATIVE
Specific Gravity, Urine: 1.027 (ref 1.001–1.035)
pH: 6 (ref 5.0–8.0)

## 2021-03-05 LAB — CBC WITH DIFFERENTIAL/PLATELET
Absolute Monocytes: 775 cells/uL (ref 200–950)
Basophils Absolute: 23 cells/uL (ref 0–200)
Basophils Relative: 0.2 %
Eosinophils Absolute: 11 cells/uL — ABNORMAL LOW (ref 15–500)
Eosinophils Relative: 0.1 %
HCT: 40.1 % (ref 38.5–50.0)
Hemoglobin: 13.7 g/dL (ref 13.2–17.1)
Lymphs Abs: 616 cells/uL — ABNORMAL LOW (ref 850–3900)
MCH: 31.6 pg (ref 27.0–33.0)
MCHC: 34.2 g/dL (ref 32.0–36.0)
MCV: 92.4 fL (ref 80.0–100.0)
MPV: 11.8 fL (ref 7.5–12.5)
Monocytes Relative: 6.8 %
Neutro Abs: 9975 cells/uL — ABNORMAL HIGH (ref 1500–7800)
Neutrophils Relative %: 87.5 %
Platelets: 290 10*3/uL (ref 140–400)
RBC: 4.34 10*6/uL (ref 4.20–5.80)
RDW: 12.4 % (ref 11.0–15.0)
Total Lymphocyte: 5.4 %
WBC: 11.4 10*3/uL — ABNORMAL HIGH (ref 3.8–10.8)

## 2021-03-05 LAB — COMPLETE METABOLIC PANEL WITH GFR
AG Ratio: 2.1 (calc) (ref 1.0–2.5)
ALT: 16 U/L (ref 9–46)
AST: 12 U/L (ref 10–35)
Albumin: 4 g/dL (ref 3.6–5.1)
Alkaline phosphatase (APISO): 46 U/L (ref 35–144)
BUN/Creatinine Ratio: 25 (calc) — ABNORMAL HIGH (ref 6–22)
BUN: 28 mg/dL — ABNORMAL HIGH (ref 7–25)
CO2: 24 mmol/L (ref 20–32)
Calcium: 9.1 mg/dL (ref 8.6–10.3)
Chloride: 102 mmol/L (ref 98–110)
Creat: 1.13 mg/dL (ref 0.70–1.35)
Globulin: 1.9 g/dL (calc) (ref 1.9–3.7)
Glucose, Bld: 336 mg/dL — ABNORMAL HIGH (ref 65–99)
Potassium: 4.5 mmol/L (ref 3.5–5.3)
Sodium: 134 mmol/L — ABNORMAL LOW (ref 135–146)
Total Bilirubin: 0.5 mg/dL (ref 0.2–1.2)
Total Protein: 5.9 g/dL — ABNORMAL LOW (ref 6.1–8.1)
eGFR: 71 mL/min/{1.73_m2} (ref >=60)

## 2021-03-05 LAB — PSA: PSA: 0.36 ng/mL (ref <=4.00)

## 2021-03-05 LAB — HEMOGLOBIN A1C
Hgb A1c MFr Bld: 6.8 % of total Hgb — ABNORMAL HIGH (ref <5.7)
Mean Plasma Glucose: 148 mg/dL
eAG (mmol/L): 8.2 mmol/L

## 2021-03-05 LAB — INSULIN, RANDOM: Insulin: 45 u[IU]/mL — ABNORMAL HIGH

## 2021-03-05 LAB — LIPID PANEL
Cholesterol: 164 mg/dL (ref <200)
HDL: 68 mg/dL (ref >=40)
LDL Cholesterol (Calc): 79 mg/dL (calc)
Non-HDL Cholesterol (Calc): 96 mg/dL (calc) (ref <130)
Total CHOL/HDL Ratio: 2.4 (calc) (ref <5.0)
Triglycerides: 83 mg/dL (ref <150)

## 2021-03-05 LAB — TSH: TSH: 0.67 mIU/L (ref 0.40–4.50)

## 2021-03-05 LAB — MAGNESIUM: Magnesium: 1.9 mg/dL (ref 1.5–2.5)

## 2021-03-05 LAB — VITAMIN D 25 HYDROXY (VIT D DEFICIENCY, FRACTURES): Vit D, 25-Hydroxy: 39 ng/mL (ref 30–100)

## 2021-03-05 MED ORDER — HYDROXYZINE HCL 25 MG PO TABS: ORAL_TABLET | ORAL | 3 refills | Status: AC

## 2021-03-05 NOTE — Progress Notes (Signed)
============================================================== °-   Test results slightly outside the reference range are not unusual. If there is anything important, I will review this with you,  otherwise it is considered normal test values.  If you have further questions,  please do not hesitate to contact me at the office or via My Chart.  ============================================================== ==============================================================  -  Glucose  was 336 mg % - Way too high - Ideal is less than 120 mg%  & A1c = 6.8%  is now in the fully Diabetic range ( over 6.5% )   - When A1c is over 7.0%, is when start meds for Diabetes  =============================================================  -  As discussed, recommend get a blood sugar monitor and   "spot check" blood sugars  and call if consistently over 200 mg%   =============================================================  - Recommend take Cinnamon 1,000 capsules 2 x /day to help lower blood sugar.  =============================================================  - PSA - very low - Great ! ============================================================= =============================================================  -  Total  Chol =   164   -  Great             (  Ideal  or  Goal is less than 180  !  )   - and   -  Bad / Dangerous LDL  Chol =   79    - also Excellent              (  Ideal  or  Goal is less than 70  !  )    - Very low risk for Heart Attack  / Stroke ============================================================ ============================================================  -  Vit D = 39  - Low  -   - Vitamin D goal is between 70-100.   - Please INCREASE  your Vitamin D 5,000 unit capsules                                                            up to 2 capsules = 10,000 units  /daily  - Vitamin D  is very important as a natural anti-inflammatory and helping the  immune system  protect against viral infections, like the Covid-19    helping hair, skin, and nails, as well as reducing stroke and  heart attack risk.   - It helps your bones and helps with mood.  - It also decreases numerous cancer risks so please  take it as directed.   - Low Vit D is associated with a 200-300% higher risk for  CANCER   and 200-300% higher risk for HEART   ATTACK  &  STROKE.    - It is also associated with higher death rate at younger ages,   autoimmune diseases like Rheumatoid arthritis, Lupus,  Multiple Sclerosis.     - Also many other serious conditions, like depression, Alzheimer's  Dementia, infertility, muscle aches, fatigue, fibromyalgia   - just to name a few. ============================================================ ============================================================  -  All Else - CBC - Kidneys -   U/A Electrolytes - Liver - Magnesium & Thyroid    - all  Normal / OK ============================================================ ============================================================

## 2021-03-10 DIAGNOSIS — Z85828 Personal history of other malignant neoplasm of skin: Secondary | ICD-10-CM | POA: Diagnosis not present

## 2021-03-10 DIAGNOSIS — D229 Melanocytic nevi, unspecified: Secondary | ICD-10-CM | POA: Diagnosis not present

## 2021-03-10 DIAGNOSIS — Z23 Encounter for immunization: Secondary | ICD-10-CM | POA: Diagnosis not present

## 2021-03-10 DIAGNOSIS — C44629 Squamous cell carcinoma of skin of left upper limb, including shoulder: Secondary | ICD-10-CM | POA: Diagnosis not present

## 2021-03-10 DIAGNOSIS — D4989 Neoplasm of unspecified behavior of other specified sites: Secondary | ICD-10-CM | POA: Diagnosis not present

## 2021-03-10 DIAGNOSIS — L578 Other skin changes due to chronic exposure to nonionizing radiation: Secondary | ICD-10-CM | POA: Diagnosis not present

## 2021-03-10 DIAGNOSIS — C4492 Squamous cell carcinoma of skin, unspecified: Secondary | ICD-10-CM | POA: Diagnosis not present

## 2021-03-10 DIAGNOSIS — L821 Other seborrheic keratosis: Secondary | ICD-10-CM | POA: Diagnosis not present

## 2021-03-10 DIAGNOSIS — L814 Other melanin hyperpigmentation: Secondary | ICD-10-CM | POA: Diagnosis not present

## 2021-03-10 DIAGNOSIS — Z86006 Personal history of melanoma in-situ: Secondary | ICD-10-CM | POA: Diagnosis not present

## 2021-03-10 DIAGNOSIS — D1801 Hemangioma of skin and subcutaneous tissue: Secondary | ICD-10-CM | POA: Diagnosis not present

## 2021-03-10 DIAGNOSIS — D225 Melanocytic nevi of trunk: Secondary | ICD-10-CM | POA: Diagnosis not present

## 2021-03-10 DIAGNOSIS — D485 Neoplasm of uncertain behavior of skin: Secondary | ICD-10-CM | POA: Diagnosis not present

## 2021-05-20 ENCOUNTER — Ambulatory Visit: Payer: PPO | Admitting: Adult Health

## 2021-06-10 ENCOUNTER — Ambulatory Visit: Payer: PPO | Admitting: Nurse Practitioner

## 2021-06-15 ENCOUNTER — Ambulatory Visit (INDEPENDENT_AMBULATORY_CARE_PROVIDER_SITE_OTHER): Payer: PPO | Admitting: Nurse Practitioner

## 2021-06-15 ENCOUNTER — Encounter: Payer: Self-pay | Admitting: Nurse Practitioner

## 2021-06-15 VITALS — BP 132/70 | HR 71 | Temp 97.7°F | Wt 193.0 lb

## 2021-06-15 DIAGNOSIS — M5440 Lumbago with sciatica, unspecified side: Secondary | ICD-10-CM

## 2021-06-15 DIAGNOSIS — G4700 Insomnia, unspecified: Secondary | ICD-10-CM

## 2021-06-15 DIAGNOSIS — E663 Overweight: Secondary | ICD-10-CM

## 2021-06-15 DIAGNOSIS — I25810 Atherosclerosis of coronary artery bypass graft(s) without angina pectoris: Secondary | ICD-10-CM

## 2021-06-15 DIAGNOSIS — Z0000 Encounter for general adult medical examination without abnormal findings: Secondary | ICD-10-CM

## 2021-06-15 DIAGNOSIS — Z79899 Other long term (current) drug therapy: Secondary | ICD-10-CM

## 2021-06-15 DIAGNOSIS — Z86718 Personal history of other venous thrombosis and embolism: Secondary | ICD-10-CM

## 2021-06-15 DIAGNOSIS — K219 Gastro-esophageal reflux disease without esophagitis: Secondary | ICD-10-CM

## 2021-06-15 DIAGNOSIS — E559 Vitamin D deficiency, unspecified: Secondary | ICD-10-CM

## 2021-06-15 DIAGNOSIS — I700 Atherosclerosis of aorta: Secondary | ICD-10-CM

## 2021-06-15 DIAGNOSIS — Z86010 Personal history of colonic polyps: Secondary | ICD-10-CM

## 2021-06-15 DIAGNOSIS — F419 Anxiety disorder, unspecified: Secondary | ICD-10-CM

## 2021-06-15 DIAGNOSIS — E782 Mixed hyperlipidemia: Secondary | ICD-10-CM

## 2021-06-15 DIAGNOSIS — R7309 Other abnormal glucose: Secondary | ICD-10-CM

## 2021-06-15 DIAGNOSIS — I10 Essential (primary) hypertension: Secondary | ICD-10-CM

## 2021-06-15 DIAGNOSIS — R972 Elevated prostate specific antigen [PSA]: Secondary | ICD-10-CM

## 2021-06-15 MED ORDER — BUSPIRONE HCL 5 MG PO TABS: ORAL_TABLET | ORAL | 0 refills | Status: DC

## 2021-06-15 MED ORDER — MELOXICAM 7.5 MG PO TABS: 7.5000 mg | ORAL_TABLET | Freq: Every day | ORAL | 0 refills | Status: DC

## 2021-06-15 NOTE — Progress Notes (Signed)
?MEDICARE ANNUAL WELLNESS VISIT AND 3MOV ? ?Assessment:  ? ?1. Encounter for Medicare annual wellness exam ?Due Annually  ? ?2. Essential hypertension ?Continue Lisinopril. ?DASH diet, exercise and monitor at home.  ?Call if greater than 130/80.  ? ?- CBC with Differential/Platelet; Future ?- COMPLETE METABOLIC PANEL WITH GFR; Future ? ? ?3. Atherosclerosis of coronary artery bypass graft of native heart without angina pectoris ?Control blood pressure, cholesterol, glucose, increase exercise.  ? ?4. Aortic atherosclerosis ([place]) by Abd CT 09/27/2018 ?Control blood pressure, cholesterol, glucose, increase exercise.  ? ? ?5. Gastroesophageal reflux disease without esophagitis ?Continue Nexium  ?Avoid triggers. Diet discussed. ?Avoid lying down after eating.  ? ?6. Hyperlipidemia, mixed ?Lifestyle modifications including dietary intake ?Increase Omega 3's. ?Weight loss.  ?Stay Active ? ?7. DVT, HX OF ?Monitor ? ?8. Abnormal glucose ?Discussed disease progression and risks ?Discussed diet/exercise, weight management and risk modification ? ?- Hemoglobin A1c; Future ? ?9. Vitamin D deficiency ?At goal at recent check; c ?Continue to recommend supplementation for goal of 60-100 ? ?10. Elevated PSA ?Continue Finasteride 5 mg ?Monitor ? ?11. Overweight (BMI 25.0-29.9) ?Continue lifestyle modifications. ?Discussed weight loss, diet, and exercise ?Discussed diet heavy in fruits and veggies and low in animal meats, cheeses, and dairy products ? ?12. Hx of adenomatous polyp of colon ?Normal 2019 ?Colonoscopy due 2024 ? ?13. Insomnia, unspecified type ?Will not refill Xanax. ?Failed Hydroxyzine ?Will start Buspar 5 mg HS  ? ?15.  Chronic Anxiety ?Will not refill Xanax. ?Continue Lexapro 10 mg ?Start Buspar 5 mg HS.   ? ?- busPIRone (BUSPAR) 5 MG tablet; Take 1 tab (5 mg) HS.  Dispense: 30 tablet; Refill: 0 ? ?14.  Acute bilateral low back pain, with sciatica. ?Start Meloxicam 7.5 mg ?Discussed taking with food.   ?Currently on  PPI. ?Stretching, alternate ice/heat. ?Donut seat. ? ?- meloxicam (MOBIC) 7.5 MG tablet; Take 1 tablet (7.5 mg total) by mouth daily.  Dispense: 30 tablet; Refill: 0 ? ?15. Medication management ?Medication discussed in detail.  ?All questions and concerns addressed ? ? ?Over 40 minutes of exam, counseling, chart review and critical decision making was performed ? ?Future Appointments  ?Date Time Provider [place]  ?09/15/2021  4:00 PM Unk Pinto, MD [facility] None  ?03/08/2022  2:00 PM Unk Pinto, MD [facility] None  ?06/16/2022  4:00 PM Darrol Jump, NP [facility] None  ? ? ? ?Plan:  ? ?During the course of the visit the patient was educated and counseled about appropriate screening and preventive services including:  ? ?Pneumococcal vaccine ?Prevnar 13  ?Influenza vaccine ?Td vaccine ?Screening electrocardiogram ?Bone densitometry screening ?Colorectal cancer screening ?Diabetes screening ?Glaucoma screening ?Nutrition counseling  ?Advanced directives: requested ? ? ?Subjective:  ?[name] is a 66 y.o. male who presents for Medicare Annual Wellness Visit and 3 month follow up.  ? ? ?Has insomnia, prescribed xanax 0.5 mg but takes infrequently, 1x/week on average, he is requesting a refill.  Has tried Hydroxyzine in the past, but makes him too sleepy.  Gabapentin is not effective.  He is using Advil PM PRN with some effectiveness.   ? ?Endorses increase in left sided sciatica pain.   He does not sit or ride with wallet in the back pocket.  Has purchased a donut seat which helps.  ? ?BMI is Body mass index is 26.92 kg/m?., he has been working on diet, not intentionally exercising but very active all day, around home and garage/yard.  ?Wt Readings from Last 3 Encounters:  ?  06/15/21 193 lb (87.5 kg)  ?03/03/21 191 lb 3.2 oz (86.7 kg)  ?12/22/20 181 lb (82.1 kg)  ? ?2008 s/p PCA/Stents with Dr. Harrington Challenger. Negative Cardiolite 2015. He has aortic atherosclerosis per CT 2020.  ? ?His blood pressure  has been controlled at home, today their BP is BP: 132/70  ? He does not workout, does yard work. He denies chest pain, shortness of breath, dizziness. ? ? He is on cholesterol medication (atoravstatin 80 mg daily) and denies myalgias. His cholesterol is at goal. Will forget to take occ.  The cholesterol last visit was:   ?Lab Results  ?Component Value Date  ? CHOL 164 03/04/2021  ? HDL 68 03/04/2021  ? [place] 79 03/04/2021  ? LDLDIRECT 190.6 07/30/2010  ? TRIG 83 03/04/2021  ? CHOLHDL 2.4 03/04/2021  ? ? He has been working on diet and exercise for prediabetes, and denies polydipsia, polyuria and visual disturbances. Last A1C in the office was:  ?Lab Results  ?Component Value Date  ? HGBA1C 6.8 (H) 03/04/2021  ? ?He does push tea/water. Last GFR:  ?Lab Results  ?Component Value Date  ? GFRNONAA 70 05/20/2020  ? GFRNONAA 50 (L) 02/17/2020  ? GFRNONAA 64 10/29/2019  ? ?Patient is on Vitamin D supplement.   ?Lab Results  ?Component Value Date  ? VD25OH 39 03/04/2021  ?   ?Hx of elevated PSA, now on finasteride with improved recently.  ?Lab Results  ?Component Value Date  ? PSA 0.36 03/04/2021  ? PSA 0.68 02/17/2020  ? PSA 0.6 12/11/2018  ? ? ? ?Medication Review: ? ? ?Current Outpatient Medications (Cardiovascular):  ?  atorvastatin (LIPITOR) 80 MG tablet, Take 1 tablet Daily for Cholesterol ?  lisinopril (ZESTRIL) 10 MG tablet, Take 1 tablet  Daily for BP                        /            TAKE 1 TABLET BY MOUTH ?  sildenafil (REVATIO) 20 MG tablet, TAKE 1 TO 5 TABS BY MOUTH DAILY AS NEEDED. ? ? ?Current Outpatient Medications (Analgesics):  ?  aspirin EC 81 MG tablet, 81 mg daily.  ? ? ?Current Outpatient Medications (Other):  ?  ALPRAZolam (XANAX) 0.5 MG tablet, TAKE 1/2 TO 1 TABLET AT BEDTIME IF NEEDED FOR SLEEP. LIMIT 5 DAYS/WEEK (Dx:  g 47.00) ?  Cholecalciferol (VITAMIN D) 125 MCG (5000 UT) CAPS, Take 1 capsule by mouth daily. ?  esomeprazole (NEXIUM) 20 MG capsule, Take 20 mg by mouth daily at 12 noon. Takes  OTC 20 mg ?  finasteride (PROSCAR) 5 MG tablet, TAKE 1 TABLE TDAILY FOR PROSTATE ?  hydrOXYzine (ATARAX) 25 MG tablet, Take  1 to 2 tablets  1 hour  before Bedtime  as needed for Sleep ?  escitalopram (LEXAPRO) 10 MG tablet, Take 1 tablet Daily for Mood (Patient not taking: Reported on 03/03/2021) ?  gabapentin (NEURONTIN) 100 MG capsule, TAKE 1 CAPSULE 3 X DAY AS NEEDED FOR PAIN OR SLEEP (Patient not taking: Reported on 06/15/2021) ? ?Allergies: ?Allergies  ?Allergen Reactions  ? Penicillins   ?  unspecified  ? ? ?Current Problems (verified) ?Patient Active Problem List  ? Diagnosis Date Noted  ? Aortic atherosclerosis ([place]) by Abd CT 09/27/2018 03/27/2019  ? Insomnia 02/21/2019  ? Overweight (BMI 25.0-29.9) 03/29/2017  ? Elevated PSA 09/16/2015  ? Abnormal glucose 07/24/2013  ? Vitamin D deficiency 07/24/2013  ? Medication management  07/24/2013  ? Hx of adenomatous polyp of colon 06/09/2010  ? Essential hypertension 08/05/2008  ? Hyperlipidemia, mixed 08/02/2006  ? ASCAD s/p PTCA/Stent (2005)  08/02/2006  ? Gastroesophageal reflux disease 08/02/2006  ? DVT, HX OF 08/02/2006  ? ? ?Screening Tests ?Immunization History  ?Administered Date(s) Administered  ? Influenza, High Dose Seasonal PF 12/22/2020  ? Influenza,inj,Quad PF,6+ Mos 12/07/2016, 11/21/2018  ? Influenza-Unspecified 01/28/2018  ? Moderna Sars-Covid-2 Vaccination 03/01/2019, 04/05/2019, 01/27/2020  ? PPD Test 07/24/2013, 07/30/2014, 09/16/2016, 10/23/2017  ? Tdap 07/30/2010  ? ?Preventative care: ?Last colonoscopy: Last Colon - 08/09/2017 - Dr Carlean Purl recc 5 yr f/u - July 2024 ? ?Prior vaccinations: ?TD or Tdap: 2012 will get PRN  ?Influenza: currently n/a ?Pneumococcal: Declines ?Shingles: declines ?Covid 19: 3/3, 2021, moderna ? ?Last eye: [facility], last 2022, new glasses ?Last dental: Dr. Geroge Baseman, last 2022 ? ?Patient Care Team: ?Unk Pinto, MD as PCP - General (Internal Medicine) ? ?Surgical: ?Past Surgical History:  ?Procedure Laterality Date   ? COLONOSCOPY    ? COLONOSCOPY W/ POLYPECTOMY  06/16/2010  ? diminutive adenoma, diverticulosis, hemorrhoids  ? CORONARY ANGIOPLASTY WITH STENT PLACEMENT    ? ESOPHAGOGASTRODUODENOSCOPY  06/16/2010  ? ant

## 2021-06-17 ENCOUNTER — Other Ambulatory Visit: Payer: Self-pay | Admitting: Nurse Practitioner

## 2021-06-17 DIAGNOSIS — Z1211 Encounter for screening for malignant neoplasm of colon: Secondary | ICD-10-CM

## 2021-06-17 DIAGNOSIS — Z79899 Other long term (current) drug therapy: Secondary | ICD-10-CM

## 2021-06-17 DIAGNOSIS — I10 Essential (primary) hypertension: Secondary | ICD-10-CM

## 2021-06-17 DIAGNOSIS — R7309 Other abnormal glucose: Secondary | ICD-10-CM

## 2021-06-18 LAB — COMPLETE METABOLIC PANEL WITH GFR
AG Ratio: 2.2 (calc) (ref 1.0–2.5)
ALT: 14 U/L (ref 9–46)
AST: 13 U/L (ref 10–35)
Albumin: 4.6 g/dL (ref 3.6–5.1)
Alkaline phosphatase (APISO): 60 U/L (ref 35–144)
BUN: 20 mg/dL (ref 7–25)
CO2: 23 mmol/L (ref 20–32)
Calcium: 9.8 mg/dL (ref 8.6–10.3)
Chloride: 105 mmol/L (ref 98–110)
Creat: 1.17 mg/dL (ref 0.70–1.35)
Globulin: 2.1 g/dL (calc) (ref 1.9–3.7)
Glucose, Bld: 94 mg/dL (ref 65–99)
Potassium: 4.8 mmol/L (ref 3.5–5.3)
Sodium: 137 mmol/L (ref 135–146)
Total Bilirubin: 0.7 mg/dL (ref 0.2–1.2)
Total Protein: 6.7 g/dL (ref 6.1–8.1)
eGFR: 68 mL/min/{1.73_m2} (ref >=60)

## 2021-06-18 LAB — CBC WITH DIFFERENTIAL/PLATELET
Absolute Monocytes: 646 cells/uL (ref 200–950)
Basophils Absolute: 53 cells/uL (ref 0–200)
Basophils Relative: 0.7 %
Eosinophils Absolute: 137 cells/uL (ref 15–500)
Eosinophils Relative: 1.8 %
HCT: 40.5 % (ref 38.5–50.0)
Hemoglobin: 13.8 g/dL (ref 13.2–17.1)
Lymphs Abs: 1535 cells/uL (ref 850–3900)
MCH: 30.7 pg (ref 27.0–33.0)
MCHC: 34.1 g/dL (ref 32.0–36.0)
MCV: 90 fL (ref 80.0–100.0)
MPV: 12.1 fL (ref 7.5–12.5)
Monocytes Relative: 8.5 %
Neutro Abs: 5229 cells/uL (ref 1500–7800)
Neutrophils Relative %: 68.8 %
Platelets: 322 10*3/uL (ref 140–400)
RBC: 4.5 10*6/uL (ref 4.20–5.80)
RDW: 12.8 % (ref 11.0–15.0)
Total Lymphocyte: 20.2 %
WBC: 7.6 10*3/uL (ref 3.8–10.8)

## 2021-06-18 LAB — HEMOGLOBIN A1C
Hgb A1c MFr Bld: 5.6 % of total Hgb (ref <5.7)
Mean Plasma Glucose: 114 mg/dL
eAG (mmol/L): 6.3 mmol/L

## 2021-06-30 ENCOUNTER — Other Ambulatory Visit: Payer: Self-pay | Admitting: Adult Health

## 2021-06-30 DIAGNOSIS — E782 Mixed hyperlipidemia: Secondary | ICD-10-CM

## 2021-07-06 NOTE — Progress Notes (Unsigned)
Future Appointments  Date Time Provider Department  07/07/2021 11:30 AM Unk Pinto, MD [facility]  09/15/2021  4:00 PM Unk Pinto, MD [facility]  03/08/2022  2:00 PM Unk Pinto, MD [facility]  06/16/2022  4:00 PM Darrol Jump, NP [facility]    History of Present Illness:      This very nice 66 y.o. MWM with  HTN, HLD, Prediabetes and Vitamin D Deficiency presenting with c/o  difficulty "peeing". Denies any dysuria, discharge or blood in urine. Is on Finasteride. Has N x 2-3.  He  also c/o intermittent LBP w/o sciatica.   Medications  Current Outpatient Medications (Endocrine & Metabolic):    dexamethasone (DECADRON) 4 MG tablet, Take 1 tab 3 x day - 3 days, then 2 x day - 3 days, then 1 tab daily  Current Outpatient Medications (Cardiovascular):    atorvastatin (LIPITOR) 80 MG tablet, TAKE 1 TABLET BY MOUTH EVERY DAY FOR CHOLESTEROL   lisinopril (ZESTRIL) 10 MG tablet, Take 1 tablet  Daily for BP                        /            TAKE 1 TABLET BY MOUTH   sildenafil (REVATIO) 20 MG tablet, TAKE 1 TO 5 TABS BY MOUTH DAILY AS NEEDED.   Current Outpatient Medications (Analgesics):    aspirin EC 81 MG tablet, 81 mg daily.    meloxicam (MOBIC) 7.5 MG tablet, Take 1 tablet (7.5 mg total) by mouth daily.   Current Outpatient Medications (Other):    ALPRAZolam (XANAX) 0.5 MG tablet, TAKE 1/2 TO 1 TABLET AT BEDTIME IF NEEDED FOR SLEEP. LIMIT 5 DAYS/WEEK (Dx:  g 47.00)   busPIRone (BUSPAR) 5 MG tablet, Take 1 tab (5 mg) HS.   Cholecalciferol (VITAMIN D) 125 MCG (5000 UT) CAPS, Take 1 capsule by mouth daily.   esomeprazole (NEXIUM) 20 MG capsule, Take 20 mg by mouth daily at 12 noon. Takes OTC 20 mg   finasteride (PROSCAR) 5 MG tablet, TAKE 1 TABLE TDAILY FOR PROSTATE   hydrOXYzine (ATARAX) 25 MG tablet, Take  1 to 2 tablets  1 hour  before Bedtime  as needed for Sleep   tamsulosin (FLOMAX) 0.4 MG CAPS capsule, Take  1 tablet  at Bedtime  for Prostate   escitalopram  (LEXAPRO) 10 MG tablet, Take 1 tablet Daily for Mood (Patient not taking: Reported on 03/03/2021)   gabapentin (NEURONTIN) 100 MG capsule, TAKE 1 CAPSULE 3 X DAY AS NEEDED FOR PAIN OR SLEEP (Patient not taking: Reported on 06/15/2021)  Problem list He has Hyperlipidemia, mixed; Essential hypertension; ASCAD s/p PTCA/Stent (2005) ; Gastroesophageal reflux disease; DVT, HX OF; Abnormal glucose; Vitamin D deficiency; Medication management; Elevated PSA; Overweight (BMI 25.0-29.9); Hx of adenomatous polyp of colon; Insomnia; and Aortic atherosclerosis ([place]) by Abd CT 09/27/2018 on their problem list.   Observations/Objective:  BP (!) 149/91   Pulse 75   Temp (!) 96.8 F (36 C)   Resp 16   Ht '5\' 11"'$  (4.098 m)   Wt 190 lb 9.6 oz (86.5 kg)   SpO2 97%   BMI 26.58 kg/m   HEENT - WNL. Neck - supple.  Chest - Clear equal BS. Cor - Nl HS. RRR w/o sig MGR. PP 1(+). No edema. MS- FROM w/o deformities.  Gait Nl. GU - DRE finds enlarged  1+, smooth, firm. Hemoccult - Negative. Neuro -  Nl w/o focal abnormalities.  Assessment  and Plan:   1. Dysuria  - Urinalysis, Routine w reflex microscopic - Urine Culture  2. Benign localized prostatic hyperplasia with lower urinary tract symptoms (LUTS)  - tamsulosin 0.4 MG CAPS capsule;  Take  1 tablet  at Bedtime  for Prostate   Dispense:  90 capsule; Refill: 3  3. Chronic bilateral low back pain without sciatica  - dexamethasone (DECADRON) 4 MG tablet;  Take 1 tab 3 x day - 3 days, then 2 x day - 3 days, then 1 tab daily   Dispense: 20 tablet; Refill: 0   Follow Up Instructions:        I discussed the assessment and treatment plan with the patient. The patient was provided an opportunity to ask questions and all were answered. The patient agreed with the plan and demonstrated an understanding of the instructions.       The patient was advised to call back or seek an in-person evaluation if the symptoms worsen or if the condition fails to  improve as anticipated.    Kirtland Bouchard, MD

## 2021-07-07 ENCOUNTER — Encounter: Payer: Self-pay | Admitting: Internal Medicine

## 2021-07-07 ENCOUNTER — Ambulatory Visit (INDEPENDENT_AMBULATORY_CARE_PROVIDER_SITE_OTHER): Payer: PPO | Admitting: Internal Medicine

## 2021-07-07 VITALS — BP 149/91 | HR 75 | Temp 96.8°F | Resp 16 | Ht 71.0 in | Wt 190.6 lb

## 2021-07-07 DIAGNOSIS — N401 Enlarged prostate with lower urinary tract symptoms: Secondary | ICD-10-CM | POA: Diagnosis not present

## 2021-07-07 DIAGNOSIS — R300 Dysuria: Secondary | ICD-10-CM

## 2021-07-07 DIAGNOSIS — M545 Low back pain, unspecified: Secondary | ICD-10-CM

## 2021-07-07 DIAGNOSIS — G8929 Other chronic pain: Secondary | ICD-10-CM | POA: Diagnosis not present

## 2021-07-07 MED ORDER — DEXAMETHASONE 4 MG PO TABS: ORAL_TABLET | ORAL | 0 refills | Status: DC

## 2021-07-07 MED ORDER — TAMSULOSIN HCL 0.4 MG PO CAPS: ORAL_CAPSULE | ORAL | 3 refills | Status: DC

## 2021-07-08 ENCOUNTER — Encounter: Payer: Self-pay | Admitting: Internal Medicine

## 2021-07-08 LAB — URINALYSIS, ROUTINE W REFLEX MICROSCOPIC
Bilirubin Urine: NEGATIVE
Glucose, UA: NEGATIVE
Hgb urine dipstick: NEGATIVE
Ketones, ur: NEGATIVE
Leukocytes,Ua: NEGATIVE
Nitrite: NEGATIVE
Protein, ur: NEGATIVE
Specific Gravity, Urine: 1.024 (ref 1.001–1.035)
pH: 5.5 (ref 5.0–8.0)

## 2021-07-08 LAB — URINE CULTURE
MICRO NUMBER:: 13465481
Result:: NO GROWTH
SPECIMEN QUALITY:: ADEQUATE

## 2021-07-09 NOTE — Progress Notes (Signed)
<><><><><><><><><><><><><><><><><><><><><><><><><><><><><><><><><> <><><><><><><><><><><><><><><><><><><><><><><><><><><><><><><><><> -   Test results slightly outside the reference range are not unusual. If there is anything important, I will review this with you,  otherwise it is considered normal test values.  If you have further questions,  please do not hesitate to contact me at the office or via My Chart.  <><><><><><><><><><><><><><><><><><><><><><><><><><><><><><><><><> <><><><><><><><><><><><><><><><><><><><><><><><><><><><><><><><><>  -  Urine culture Negative - No Infection   <><><><><><><><><><><><><><><><><><><><><><><><><><><><><><><><><> <><><><><><><><><><><><><><><><><><><><><><><><><><><><><><><><><>

## 2021-07-13 ENCOUNTER — Other Ambulatory Visit: Payer: Self-pay | Admitting: Nurse Practitioner

## 2021-07-13 DIAGNOSIS — F419 Anxiety disorder, unspecified: Secondary | ICD-10-CM

## 2021-07-13 DIAGNOSIS — M5440 Lumbago with sciatica, unspecified side: Secondary | ICD-10-CM

## 2021-09-11 ENCOUNTER — Other Ambulatory Visit: Payer: Self-pay

## 2021-09-11 ENCOUNTER — Encounter (HOSPITAL_BASED_OUTPATIENT_CLINIC_OR_DEPARTMENT_OTHER): Payer: Self-pay | Admitting: Emergency Medicine

## 2021-09-11 ENCOUNTER — Emergency Department (HOSPITAL_BASED_OUTPATIENT_CLINIC_OR_DEPARTMENT_OTHER)
Admission: EM | Admit: 2021-09-11 | Discharge: 2021-09-11 | Disposition: A | Discharge status: Home / Self Care | Payer: PPO | Attending: Emergency Medicine | Admitting: Emergency Medicine

## 2021-09-11 DIAGNOSIS — M25512 Pain in left shoulder: Secondary | ICD-10-CM | POA: Insufficient documentation

## 2021-09-11 DIAGNOSIS — R200 Anesthesia of skin: Secondary | ICD-10-CM | POA: Insufficient documentation

## 2021-09-11 DIAGNOSIS — Z5321 Procedure and treatment not carried out due to patient leaving prior to being seen by health care provider: Secondary | ICD-10-CM | POA: Diagnosis not present

## 2021-09-11 DIAGNOSIS — M25511 Pain in right shoulder: Secondary | ICD-10-CM | POA: Insufficient documentation

## 2021-09-11 LAB — CBC
HCT: 38.9 % — ABNORMAL LOW (ref 39.0–52.0)
Hemoglobin: 13.1 g/dL (ref 13.0–17.0)
MCH: 29.3 pg (ref 26.0–34.0)
MCHC: 33.7 g/dL (ref 30.0–36.0)
MCV: 87 fL (ref 80.0–100.0)
Platelets: 231 10*3/uL (ref 150–400)
RBC: 4.47 MIL/uL (ref 4.22–5.81)
RDW: 14.4 % (ref 11.5–15.5)
WBC: 6.5 10*3/uL (ref 4.0–10.5)
nRBC: 0 % (ref 0.0–0.2)

## 2021-09-11 LAB — TROPONIN I (HIGH SENSITIVITY)
Troponin I (High Sensitivity): 3 ng/L (ref <18)
Troponin I (High Sensitivity): 3 ng/L (ref <18)

## 2021-09-11 LAB — COMPREHENSIVE METABOLIC PANEL
ALT: 16 U/L (ref 0–44)
AST: 14 U/L — ABNORMAL LOW (ref 15–41)
Albumin: 4.5 g/dL (ref 3.5–5.0)
Alkaline Phosphatase: 50 U/L (ref 38–126)
Anion gap: 13 (ref 5–15)
BUN: 22 mg/dL (ref 8–23)
CO2: 22 mmol/L (ref 22–32)
Calcium: 9.4 mg/dL (ref 8.9–10.3)
Chloride: 106 mmol/L (ref 98–111)
Creatinine, Ser: 1.02 mg/dL (ref 0.61–1.24)
GFR, Estimated: >60 mL/min (ref >60)
Glucose, Bld: 98 mg/dL (ref 70–99)
Potassium: 4.3 mmol/L (ref 3.5–5.1)
Sodium: 141 mmol/L (ref 135–145)
Total Bilirubin: 0.4 mg/dL (ref 0.3–1.2)
Total Protein: 6.8 g/dL (ref 6.5–8.1)

## 2021-09-11 LAB — DIFFERENTIAL
Abs Immature Granulocytes: 0.02 10*3/uL (ref 0.00–0.07)
Basophils Absolute: 0.1 10*3/uL (ref 0.0–0.1)
Basophils Relative: 1 %
Eosinophils Absolute: 0.3 10*3/uL (ref 0.0–0.5)
Eosinophils Relative: 5 %
Immature Granulocytes: 0 %
Lymphocytes Relative: 26 %
Lymphs Abs: 1.7 10*3/uL (ref 0.7–4.0)
Monocytes Absolute: 0.7 10*3/uL (ref 0.1–1.0)
Monocytes Relative: 11 %
Neutro Abs: 3.8 10*3/uL (ref 1.7–7.7)
Neutrophils Relative %: 57 %

## 2021-09-11 LAB — ETHANOL: Alcohol, Ethyl (B): <10 mg/dL (ref <10)

## 2021-09-11 MED ORDER — SODIUM CHLORIDE 0.9% FLUSH
3.0000 mL | Freq: Once | INTRAVENOUS | Status: DC
  Filled 2021-09-11: qty 3

## 2021-09-11 NOTE — ED Notes (Signed)
Pt wants to leave AMA, aware of risks of doing so.

## 2021-09-11 NOTE — ED Triage Notes (Signed)
Past week pt has had shoulder (bilateral) pain . Numbness in left arm started either last night or the day before. Left leg is numb intermittent for last week. Sometimes his vision is doubled.

## 2021-09-11 NOTE — ED Notes (Signed)
Needs a repeat CMP and PTT

## 2021-09-11 NOTE — ED Notes (Signed)
Pt roomed, states he is unhappy with service and checked all of his bloodwork on Mychart, feels he is fine to go home, removed EKG stickers.

## 2021-09-11 NOTE — ED Notes (Signed)
Troponin added to labwork, lab called for add on. Processing now

## 2021-09-11 NOTE — ED Triage Notes (Signed)
Pt had been on lexapro for a month, stopped taking  2 days ago.

## 2021-09-14 NOTE — Progress Notes (Unsigned)
Future Appointments  Date Time Provider Department  09/15/2021                 6 mo ov  4:00 PM Unk Pinto, MD [facility]  03/08/2022                cpe  2:00 PM Unk Pinto, MD [facility]  06/16/2022  4:00 PM Darrol Jump, NP [facility]     History of Present Illness:       This very nice 66 y.o. MWM  presents for 6  month follow up with HTN, HLD, Pre-Diabetes and Vitamin D Deficiency.       Patient is treated for HTN (2010) & BP has been controlled at home. Today's BP is   at goal - 126/80 . Patient has had no complaints of any cardiac type chest pain, palpitations, dyspnea Vertell Limber /PND, dizziness, claudication, or dependent edema. Patient was in the ER 2 days ago with CP  and left w/o being evaluated fully.        Hyperlipidemia is controlled with diet & meds. Patient denies myalgias or other med SE's. Last Lipids were at goal :  Lab Results  Component Value Date   CHOL 164 03/04/2021   HDL 68 03/04/2021   LDLCALC 79 03/04/2021   TRIG 83 03/04/2021   CHOLHDL 2.4 03/04/2021     Also, the patient has history of PreDiabetes and has had no symptoms of reactive hypoglycemia, diabetic polys, paresthesias or visual blurring.  Last A1c was not at goal :   Lab Results  Component Value Date   HGBA1C 5.6 06/17/2021                                                      Further, the patient also has history of Vitamin D Deficiency and supplements vitamin D without any suspected side-effects. Last vitamin D was not at goal :  Lab Results  Component Value Date   VD25OH 39 03/04/2021       Current Outpatient Medications on File Prior to Visit  Medication Sig   ALPRAZolam (XANAX) 0.5 MG tablet TAKE 1/2 TO 1 TABLET AT BEDTIME IF NEEDED    aspirin EC 81 MG tablet  daily.    atorvastatin 80 MG tablet Take 1 tablet Daily    VITAMIN D  5000  u Take 1 capsule daily.   Esomeprazole 20 MG  Take 20 mg daily at 12 noon.    finasteride  5 MG  taking differently: 2.5 mg.  Take 1 table Daily    lisinopril 10 MG  TAKE 1 TABLET  EVERY DAY   sildenafil  20 MG  TAKE 1 TO 5 TABS DAILY     Allergies  Allergen Reactions   Penicillins     unspecified    PMHx:   Past Medical History:  Diagnosis Date   ANXIETY 12/25/2007   ANXIETY STATE NEC 11/01/2006   CORONARY ARTERY DISEASE 08/02/2006   off Plavix x 3 years   DIVERTICULITIS OF COLON 01/06/2010   DVT, HX OF 08/02/2006   GERD with stricture 08/02/2006   History of thyroiditis 12/15/2018   Hx of adenomatous polyp of colon 06/09/2010   HYPERLIPIDEMIA 08/02/2006   HYPERTENSION 08/05/2008   Hypothyroidism 03/19/2014   Memory loss 07/21/2009   Pilonidal cyst    low  back pain   Prostatitis 09/16/2015   Recurrent inguinal hernia    right     Immunization History  Administered Date(s) Administered   Influenza,inj,Quad  12/07/2016, 11/21/2018   Influenza 01/28/2018   Moderna Sars-Covid-2 Vacc 03/01/2019, 04/05/2019, 01/27/2020   PPD Test 07/24/2013, 07/30/2014, 09/16/2016, 10/23/2017   Tdap 07/30/2010     Past Surgical History:  Procedure Laterality Date   COLONOSCOPY     COLONOSCOPY W/ POLYPECTOMY  06/16/2010   diminutive adenoma, diverticulosis, hemorrhoids   CORONARY ANGIOPLASTY WITH STENT PLACEMENT     ESOPHAGOGASTRODUODENOSCOPY  06/16/2010   antral ulcers, esophageal stricture - dilated, duodenitis, hiatal hernia   HERNIA REPAIR Right    INGUINAL HERNIA REPAIR Right 11/05/2018   Procedure: OPEN RIGHT INGUINAL HERNIA REPAIR WITH MESH;  Surgeon: Coralie Keens, MD;  Location: [place];  Service: General;  Laterality: Right;  TAP BLOCK   PILONIDAL CYST EXCISION     teenager   POLYPECTOMY     UPPER GASTROINTESTINAL ENDOSCOPY      FHx:    Reviewed / unchanged  SHx:    Reviewed / unchanged   Systems Review:  Constitutional: Denies fever, chills, wt changes, headaches, insomnia, fatigue, night sweats, change in appetite. Eyes: Denies redness, blurred vision, diplopia, discharge,  itchy, watery eyes.  ENT: Denies discharge, congestion, post nasal drip, epistaxis, sore throat, earache, hearing loss, dental pain, tinnitus, vertigo, sinus pain, snoring.  CV: Denies chest pain, palpitations, irregular heartbeat, syncope, dyspnea, diaphoresis, orthopnea, PND, claudication or edema. Respiratory: denies cough, dyspnea, DOE, pleurisy, hoarseness, laryngitis, wheezing.  Gastrointestinal: Denies dysphagia, odynophagia, heartburn, reflux, water brash, abdominal pain or cramps, nausea, vomiting, bloating, diarrhea, constipation, hematemesis, melena, hematochezia  or hemorrhoids. Genitourinary: Denies dysuria, frequency, urgency, nocturia, hesitancy, discharge, hematuria or flank pain. Musculoskeletal: Denies arthralgias, myalgias, stiffness, jt. swelling, pain, limping or strain/sprain.  Skin: Denies pruritus, rash, hives, warts, acne, eczema or change in skin lesion(s). Neuro: No weakness, tremor, incoordination, spasms, paresthesia or pain. Psychiatric: Denies confusion, memory loss or sensory loss. Endo: Denies change in weight, skin or hair change.  Heme/Lymph: No excessive bleeding, bruising or enlarged lymph nodes.  Physical Exam  BP 126/80   Pulse 85   Temp (!) 97.5 F (36.4 C)   Resp 16   Ht '5\' 11"'$  (1.803 m)   Wt 183 lb 9.6 oz (83.3 kg)   SpO2 98%   BMI 25.61 kg/m   Appears  well nourished, well groomed  and in no distress.  Eyes: PERRLA, EOMs, conjunctiva no swelling or erythema. Sinuses: No frontal/maxillary tenderness ENT/Mouth: EAC's clear, TM's nl w/o erythema, bulging. Nares clear w/o erythema, swelling, exudates. Oropharynx clear without erythema or exudates. Oral hygiene is good. Tongue normal, non obstructing. Hearing intact.  Neck: Supple. Thyroid not palpable. Car 2+/2+ without bruits, nodes or JVD. Chest: Respirations nl with BS clear & equal w/o rales, rhonchi, wheezing or stridor.  Cor: Heart sounds normal w/ regular rate and rhythm without sig.  murmurs, gallops, clicks or rubs. Peripheral pulses normal and equal  without edema.  Abdomen: Soft & bowel sounds normal. Non-tender w/o guarding, rebound, hernias, masses or organomegaly.  Lymphatics: Unremarkable.  Musculoskeletal: Full ROM all peripheral extremities, joint stability, 5/5 strength and normal gait.  Skin: Warm, dry without exposed rashes, lesions or ecchymosis apparent.  Neuro: Cranial nerves intact, reflexes equal bilaterally. Sensory-motor testing grossly intact. Tendon reflexes grossly intact.  Pysch: Alert & oriented x 3.  Insight and judgement nl & appropriate. No ideations.  Assessment and Plan:  1. Essential hypertension  - Continue medication, monitor blood pressure at home.  - Continue DASH diet.  Reminder to go to the ER if any CP,  SOB, nausea, dizziness, severe HA, changes vision/speech.   - CBC with Differential/Platelet - COMPLETE METABOLIC PANEL WITH GFR - Magnesium - TSH  2. Hyperlipidemia, mixed  - Continue diet/meds, exercise,& lifestyle modifications.  - Continue monitor periodic cholesterol/liver & renal functions    - Lipid panel - TSH  3. Abnormal glucose  - Hemoglobin A1c - Insulin, random  4. Vitamin D deficiency  - Continue diet, exercise  - Lifestyle modifications.  - Monitor appropriate labs. - Continue supplementation.    - VITAMIN D 25 Hydroxy   5. Gastroesophageal reflux disease without esophagitis  - CBC with Differential/Platelet  6. Medication management   - CBC with Differential/Platelet - COMPLETE METABOLIC PANEL WITH GFR - Magnesium - Lipid panel - TSH - Hemoglobin A1c - Insulin, random - VITAMIN D 25 Hydroxy          Discussed  regular exercise, BP monitoring, weight control to achieve/maintain BMI less than 25 and discussed med and SE's. Recommended labs to assess and monitor clinical status with further disposition pending results of labs.  I discussed the assessment and treatment plan with the  patient. The patient was provided an opportunity to ask questions and all were answered. The patient agreed with the plan and demonstrated an understanding of the instructions.  I provided over 30 minutes of exam, counseling, chart review and  complex critical decision making.        The patient was advised to call back or seek an in-person evaluation if the symptoms worsen or if the condition fails to improve as anticipated.   Kirtland Bouchard, MD

## 2021-09-15 ENCOUNTER — Encounter: Payer: Self-pay | Admitting: Internal Medicine

## 2021-09-15 ENCOUNTER — Ambulatory Visit (INDEPENDENT_AMBULATORY_CARE_PROVIDER_SITE_OTHER): Payer: PPO | Admitting: Internal Medicine

## 2021-09-15 VITALS — BP 126/80 | HR 85 | Temp 97.5°F | Resp 16 | Ht 71.0 in | Wt 183.6 lb

## 2021-09-15 DIAGNOSIS — E782 Mixed hyperlipidemia: Secondary | ICD-10-CM

## 2021-09-15 DIAGNOSIS — R7309 Other abnormal glucose: Secondary | ICD-10-CM | POA: Diagnosis not present

## 2021-09-15 DIAGNOSIS — I10 Essential (primary) hypertension: Secondary | ICD-10-CM

## 2021-09-15 DIAGNOSIS — K219 Gastro-esophageal reflux disease without esophagitis: Secondary | ICD-10-CM

## 2021-09-15 DIAGNOSIS — Z79899 Other long term (current) drug therapy: Secondary | ICD-10-CM | POA: Diagnosis not present

## 2021-09-15 DIAGNOSIS — E559 Vitamin D deficiency, unspecified: Secondary | ICD-10-CM

## 2021-09-15 NOTE — Progress Notes (Incomplete)
Future Appointments  Date Time Provider Department  09/15/2021                 6 mo ov  4:00 PM Unk Pinto, MD [facility]  03/08/2022                 2:00 PM Unk Pinto, MD [facility]  06/16/2022  4:00 PM Darrol Jump, NP [facility]      History of Present Illness:       This very nice 66 y.o. MWM  presents for 6  month follow up with HTN, HLD, Pre-Diabetes and Vitamin D Deficiency.       Patient is treated for HTN (2010) & BP has been controlled at home. Today's BP is at goal - 116/68. Patient has had no complaints of any cardiac type chest pain, palpitations, dyspnea / orthopnea / PND, dizziness, claudication, or dependent edema.       Hyperlipidemia is controlled with diet & meds. Patient denies myalgias or other med SE's. Last Lipids were at goal :  Lab Results  Component Value Date   CHOL 164 03/04/2021   HDL 68 03/04/2021   LDLCALC 79 03/04/2021   TRIG 83 03/04/2021   CHOLHDL 2.4 03/04/2021     Also, the patient has history of PreDiabetes and has had no symptoms of reactive hypoglycemia, diabetic polys, paresthesias or visual blurring.  Last A1c was not at goal :   Lab Results  Component Value Date   HGBA1C 5.6 06/17/2021                                                      Further, the patient also has history of Vitamin D Deficiency and supplements vitamin D without any suspected side-effects. Last vitamin D was not at goal :  Lab Results  Component Value Date   VD25OH 39 03/04/2021       Current Outpatient Medications on File Prior to Visit  Medication Sig  . ALPRAZolam (XANAX) 0.5 MG tablet TAKE 1/2 TO 1 TABLET AT BEDTIME IF NEEDED   . aspirin EC 81 MG tablet  daily.   Marland Kitchen atorvastatin 80 MG tablet Take 1 tablet Daily   . VITAMIN D  5000  u Take 1 capsule daily.  . Esomeprazole 20 MG  Take 20 mg daily at 12 noon.   . finasteride  5 MG  taking differently: 2.5 mg. Take 1 table Daily   . lisinopril 10 MG  TAKE 1 TABLET  EVERY DAY  .  sildenafil  20 MG  TAKE 1 TO 5 TABS DAILY     Allergies  Allergen Reactions  . Penicillins     unspecified    PMHx:   Past Medical History:  Diagnosis Date  . ANXIETY 12/25/2007  . ANXIETY STATE NEC 11/01/2006  . CORONARY ARTERY DISEASE 08/02/2006   off Plavix x 3 years  . DIVERTICULITIS OF COLON 01/06/2010  . DVT, HX OF 08/02/2006  . 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 History of thyroiditis 12/15/2018  . Hx of adenomatous polyp of colon 06/09/2010  . HYPERLIPIDEMIA 08/02/2006  . HYPERTENSION 08/05/2008  . Hypothyroidism 03/19/2014  . Memory loss 07/21/2009  . Pilonidal cyst    low back pain  . Prostatitis 09/16/2015  . Recurrent inguinal hernia    right  Immunization History  Administered Date(s) Administered  . Influenza,inj,Quad PF,6+ Mos 12/07/2016, 11/21/2018  . Influenza-Unspecified 01/28/2018  . Moderna Sars-Covid-2 Vaccination 03/01/2019, 04/05/2019, 01/27/2020  . PPD Test 07/24/2013, 07/30/2014, 09/16/2016, 10/23/2017  . Tdap 07/30/2010     Past Surgical History:  Procedure Laterality Date  . COLONOSCOPY    . COLONOSCOPY W/ POLYPECTOMY  06/16/2010   diminutive adenoma, diverticulosis, hemorrhoids  . CORONARY ANGIOPLASTY WITH STENT PLACEMENT    . ESOPHAGOGASTRODUODENOSCOPY  06/16/2010   antral ulcers, esophageal stricture - dilated, duodenitis, hiatal hernia  . HERNIA REPAIR Right   . INGUINAL HERNIA REPAIR Right 11/05/2018   Procedure: OPEN RIGHT INGUINAL HERNIA REPAIR WITH MESH;  Surgeon: Coralie Keens, MD;  Location: [place];  Service: General;  Laterality: Right;  TAP BLOCK  . PILONIDAL CYST EXCISION     teenager  . POLYPECTOMY    . UPPER GASTROINTESTINAL ENDOSCOPY      FHx:    Reviewed / unchanged  SHx:    Reviewed / unchanged   Systems Review:  Constitutional: Denies fever, chills, wt changes, headaches, insomnia, fatigue, night sweats, change in appetite. Eyes: Denies redness, blurred vision, diplopia, discharge, itchy, watery  eyes.  ENT: Denies discharge, congestion, post nasal drip, epistaxis, sore throat, earache, hearing loss, dental pain, tinnitus, vertigo, sinus pain, snoring.  CV: Denies chest pain, palpitations, irregular heartbeat, syncope, dyspnea, diaphoresis, orthopnea, PND, claudication or edema. Respiratory: denies cough, dyspnea, DOE, pleurisy, hoarseness, laryngitis, wheezing.  Gastrointestinal: Denies dysphagia, odynophagia, heartburn, reflux, water brash, abdominal pain or cramps, nausea, vomiting, bloating, diarrhea, constipation, hematemesis, melena, hematochezia  or hemorrhoids. Genitourinary: Denies dysuria, frequency, urgency, nocturia, hesitancy, discharge, hematuria or flank pain. Musculoskeletal: Denies arthralgias, myalgias, stiffness, jt. swelling, pain, limping or strain/sprain.  Skin: Denies pruritus, rash, hives, warts, acne, eczema or change in skin lesion(s). Neuro: No weakness, tremor, incoordination, spasms, paresthesia or pain. Psychiatric: Denies confusion, memory loss or sensory loss. Endo: Denies change in weight, skin or hair change.  Heme/Lymph: No excessive bleeding, bruising or enlarged lymph nodes.  Physical Exam  There were no vitals taken for this visit.  Appears  well nourished, well groomed  and in no distress.  Eyes: PERRLA, EOMs, conjunctiva no swelling or erythema. Sinuses: No frontal/maxillary tenderness ENT/Mouth: EAC's clear, TM's nl w/o erythema, bulging. Nares clear w/o erythema, swelling, exudates. Oropharynx clear without erythema or exudates. Oral hygiene is good. Tongue normal, non obstructing. Hearing intact.  Neck: Supple. Thyroid not palpable. Car 2+/2+ without bruits, nodes or JVD. Chest: Respirations nl with BS clear & equal w/o rales, rhonchi, wheezing or stridor.  Cor: Heart sounds normal w/ regular rate and rhythm without sig. murmurs, gallops, clicks or rubs. Peripheral pulses normal and equal  without edema.  Abdomen: Soft & bowel sounds normal.  Non-tender w/o guarding, rebound, hernias, masses or organomegaly.  Lymphatics: Unremarkable.  Musculoskeletal: Full ROM all peripheral extremities, joint stability, 5/5 strength and normal gait.  Skin: Warm, dry without exposed rashes, lesions or ecchymosis apparent.  Neuro: Cranial nerves intact, reflexes equal bilaterally. Sensory-motor testing grossly intact. Tendon reflexes grossly intact.  Pysch: Alert & oriented x 3.  Insight and judgement nl & appropriate. No ideations.  Assessment and Plan:  - Continue medication, monitor blood pressure at home.  - Continue DASH diet.  Reminder to go to the ER if any CP,  SOB, nausea, dizziness, severe HA, changes vision/speech.  - Continue diet/meds, exercise,& lifestyle modifications.  - Continue monitor periodic cholesterol/liver & renal functions    - Continue diet,  exercise  - Lifestyle modifications.  - Monitor appropriate labs. - Continue supplementation.        Discussed  regular exercise, BP monitoring, weight control to achieve/maintain BMI less than 25 and discussed med and SE's. Recommended labs to assess and monitor clinical status with further disposition pending results of labs.  I discussed the assessment and treatment plan with the patient. The patient was provided an opportunity to ask questions and all were answered. The patient agreed with the plan and demonstrated an understanding of the instructions.  I provided over 30 minutes of exam, counseling, chart review and  complex critical decision making.        The patient was advised to call back or seek an in-person evaluation if the symptoms worsen or if the condition fails to improve as anticipated.   Kirtland Bouchard, MD

## 2021-09-15 NOTE — Patient Instructions (Signed)

## 2021-09-16 LAB — COMPLETE METABOLIC PANEL WITH GFR
AG Ratio: 2.1 (calc) (ref 1.0–2.5)
ALT: 18 U/L (ref 9–46)
AST: 16 U/L (ref 10–35)
Albumin: 4.6 g/dL (ref 3.6–5.1)
Alkaline phosphatase (APISO): 57 U/L (ref 35–144)
BUN: 20 mg/dL (ref 7–25)
CO2: 27 mmol/L (ref 20–32)
Calcium: 9.9 mg/dL (ref 8.6–10.3)
Chloride: 105 mmol/L (ref 98–110)
Creat: 1.16 mg/dL (ref 0.70–1.35)
Globulin: 2.2 g/dL (calc) (ref 1.9–3.7)
Glucose, Bld: 94 mg/dL (ref 65–99)
Potassium: 4.3 mmol/L (ref 3.5–5.3)
Sodium: 140 mmol/L (ref 135–146)
Total Bilirubin: 0.4 mg/dL (ref 0.2–1.2)
Total Protein: 6.8 g/dL (ref 6.1–8.1)
eGFR: 69 mL/min/{1.73_m2} (ref >=60)

## 2021-09-16 LAB — CBC WITH DIFFERENTIAL/PLATELET
Absolute Monocytes: 748 cells/uL (ref 200–950)
Basophils Absolute: 70 cells/uL (ref 0–200)
Basophils Relative: 0.8 %
Eosinophils Absolute: 191 cells/uL (ref 15–500)
Eosinophils Relative: 2.2 %
HCT: 40.9 % (ref 38.5–50.0)
Hemoglobin: 14.1 g/dL (ref 13.2–17.1)
Lymphs Abs: 1627 cells/uL (ref 850–3900)
MCH: 29.7 pg (ref 27.0–33.0)
MCHC: 34.5 g/dL (ref 32.0–36.0)
MCV: 86.3 fL (ref 80.0–100.0)
MPV: 12.4 fL (ref 7.5–12.5)
Monocytes Relative: 8.6 %
Neutro Abs: 6064 cells/uL (ref 1500–7800)
Neutrophils Relative %: 69.7 %
Platelets: 323 10*3/uL (ref 140–400)
RBC: 4.74 10*6/uL (ref 4.20–5.80)
RDW: 14 % (ref 11.0–15.0)
Total Lymphocyte: 18.7 %
WBC: 8.7 10*3/uL (ref 3.8–10.8)

## 2021-09-16 LAB — TSH: TSH: 3.28 mIU/L (ref 0.40–4.50)

## 2021-09-16 LAB — VITAMIN D 25 HYDROXY (VIT D DEFICIENCY, FRACTURES): Vit D, 25-Hydroxy: 103 ng/mL — ABNORMAL HIGH (ref 30–100)

## 2021-09-16 LAB — HEMOGLOBIN A1C
Hgb A1c MFr Bld: 6 % of total Hgb — ABNORMAL HIGH (ref <5.7)
Mean Plasma Glucose: 126 mg/dL
eAG (mmol/L): 7 mmol/L

## 2021-09-16 LAB — MAGNESIUM: Magnesium: 2 mg/dL (ref 1.5–2.5)

## 2021-09-16 LAB — LIPID PANEL
Cholesterol: 135 mg/dL (ref <200)
HDL: 47 mg/dL (ref >=40)
LDL Cholesterol (Calc): 66 mg/dL (calc)
Non-HDL Cholesterol (Calc): 88 mg/dL (calc) (ref <130)
Total CHOL/HDL Ratio: 2.9 (calc) (ref <5.0)
Triglycerides: 135 mg/dL (ref <150)

## 2021-09-16 LAB — INSULIN, RANDOM: Insulin: 13.5 u[IU]/mL

## 2021-09-16 NOTE — Progress Notes (Signed)
<><><><><><><><><><><><><><><><><><><><><><><><><><><><><><><><><> <><><><><><><><><><><><><><><><><><><><><><><><><><><><><><><><><> -   Test results slightly outside the reference range are not unusual. If there is anything important, I will review this with you,  otherwise it is considered normal test values.  If you have further questions,  please do not hesitate to contact me at the office or via My Chart.  <><><><><><><><><><><><><><><><><><><><><><><><><><><><><><><><><> <><><><><><><><><><><><><><><><><><><><><><><><><><><><><><><><><>  -  A1c = 6.0 %  Blood sugar and A1c are elevated    AGAIN  in the borderline and  early or pre-diabetes range which has the same   300% increased risk for heart attack,                                                       stroke,                                                          cancer and                                                                  alzheimer- type vascular dementia                                                                                                          as full blown diabetes.   But the good news is that diet, exercise with  weight loss can cure the early diabetes at this point. <><><><><><><><><><><><><><><><><><><><><><><><><><><><><><><><><> <><><><><><><><><><><><><><><><><><><><><><><><><><><><><><><><><>  -  Vitamin D = 103  -   Excellent  !        -     Please keep dose same  <><><><><><><><><><><><><><><><><><><><><><><><><><><><><><><><><> <><><><><><><><><><><><><><><><><><><><><><><><><><><><><><><><><>  -  Total Chol =  135    &   LDL Chol = 66   - Wonderful  <><><><><><><><><><><><><><><><><><><><><><><><><><><><><><><><><> <><><><><><><><><><><><><><><><><><><><><><><><><><><><><><><><><>  -

## 2021-10-02 ENCOUNTER — Other Ambulatory Visit: Payer: Self-pay | Admitting: Internal Medicine

## 2021-10-14 ENCOUNTER — Ambulatory Visit (INDEPENDENT_AMBULATORY_CARE_PROVIDER_SITE_OTHER): Payer: PPO | Admitting: Nurse Practitioner

## 2021-10-14 ENCOUNTER — Encounter: Payer: Self-pay | Admitting: Nurse Practitioner

## 2021-10-14 DIAGNOSIS — M5440 Lumbago with sciatica, unspecified side: Secondary | ICD-10-CM

## 2021-10-14 DIAGNOSIS — M7072 Other bursitis of hip, left hip: Secondary | ICD-10-CM | POA: Diagnosis not present

## 2021-10-14 DIAGNOSIS — M5432 Sciatica, left side: Secondary | ICD-10-CM

## 2021-10-14 MED ORDER — MELOXICAM 7.5 MG PO TABS: 7.5000 mg | ORAL_TABLET | Freq: Every day | ORAL | 0 refills | Status: AC

## 2021-10-14 MED ORDER — DEXAMETHASONE 4 MG PO TABS: ORAL_TABLET | ORAL | 0 refills | Status: DC

## 2021-10-14 NOTE — Patient Instructions (Signed)
Sciatica Rehab Ask your health care provider which exercises are safe for you. Do exercises exactly as told by your health care provider and adjust them as directed. It is normal to feel mild stretching, pulling, tightness, or discomfort as you do these exercises. Stop right away if you feel sudden pain or your pain gets worse. Do not begin these exercises until told by your health care provider. Stretching and range-of-motion exercises These exercises warm up your muscles and joints and improve the movement and flexibility of your hips and back. These exercises also help to relieve pain, numbness, and tingling. Sciatic nerve glide  Sit in a chair with your head facing down toward your chest. Place your hands behind your back. Let your shoulders slump forward. Slowly straighten one of your legs while you tilt your head back as if you are looking toward the ceiling. Only straighten your leg as far as you can without making your symptoms worse. Hold this position for __________ seconds. Slowly return your leg and head back to the starting position. Repeat with your other leg. Repeat __________ times. Complete this exercise __________ times a day. Knee to chest with hip adduction and internal rotation  Lie on your back on a firm surface with both legs straight. Bend one of your knees and move it up toward your chest until you feel a gentle stretch in your lower back and buttock. Then, move your knee toward the shoulder that is on the opposite side from your leg. This is hip adduction and internal rotation. Hold your leg in this position by holding on to the front of your knee. Hold this position for __________ seconds. Slowly return to the starting position. Repeat with your other leg. Repeat __________ times. Complete this exercise __________ times a day. Prone extension on elbows  Lie on your abdomen on a firm surface. A bed may be too soft for this exercise. Prop yourself up on your  elbows. Use your arms to help lift your chest up until you feel a gentle stretch in your abdomen and your lower back. This will place some of your body weight on your elbows. If this is uncomfortable, try stacking pillows under your chest. Your hips should stay down, against the surface that you are lying on. Keep your hip and back muscles relaxed. Hold this position for __________ seconds. Slowly relax your upper body and return to the starting position. Repeat __________ times. Complete this exercise __________ times a day. Strengthening exercises These exercises build strength and endurance in your back. Endurance is the ability to use your muscles for a long time, even after they get tired. Pelvic tilt This exercise strengthens the muscles that lie deep in the abdomen. Lie on your back on a firm surface. Bend your knees and keep your feet flat on the surface. Tense your abdominal muscles. Tip your pelvis up toward the ceiling and flatten your lower back into the firm surface. To help with this exercise, you may place a small towel under your lower back and try to push your back into the towel. Hold this position for __________ seconds. Let your muscles relax completely before you repeat this exercise. Repeat __________ times. Complete this exercise __________ times a day. Alternating arm and leg raises  Get on your hands and knees on a firm surface. If you are on a hard floor, you may want to use padding, such as an exercise mat, to cushion your knees. Line up your arms and legs. Your  hands should be directly below your shoulders, and your knees should be directly below your hips. Lift your left leg behind you. At the same time, raise your right arm and straighten it in front of you. Do not lift your leg higher than your hip. Do not lift your arm higher than your shoulder. Keep your abdominal and back muscles tight. Keep your hips facing the ground. Do not arch your back. Keep your  balance carefully, and do not hold your breath. Hold this position for __________ seconds. Slowly return to the starting position. Repeat with your right leg and your left arm. Repeat __________ times. Complete this exercise __________ times a day. Posture and body mechanics Good posture and healthy body mechanics can help to relieve stress in your body's tissues and joints. Body mechanics refers to the movements and positions of your body while you do your daily activities. Posture is part of body mechanics. Good posture means: Your spine is in its natural S-curve position (neutral). Your shoulders are pulled back slightly. Your head is not tipped forward. Follow these guidelines to improve your posture and body mechanics in your everyday activities. Standing  When standing, keep your spine neutral and your feet about hip width apart. Keep a slight bend in your knees. Your ears, shoulders, and hips should line up. When you do a task in which you stand in one place for a long time, place one foot up on a stable object that is 2-4 inches (5-10 cm) high, such as a footstool. This helps keep your spine neutral. Sitting  When sitting, keep your spine neutral and keep your feet flat on the floor. Use a footrest, if necessary, and keep your thighs parallel to the floor. Avoid rounding your shoulders, and avoid tilting your head forward. When working at a desk or a computer, keep your desk at a height where your hands are slightly lower than your elbows. Slide your chair under your desk so you are close enough to maintain good posture. When working at a computer, place your monitor at a height where you are looking straight ahead and you do not have to tilt your head forward or downward to look at the screen. Resting  When lying down and resting, avoid positions that are most painful for you. If you have pain with activities such as sitting, bending, stooping, or squatting, lie in a position in which  your body does not bend very much. For example, avoid curling up on your side with your arms and knees near your chest (fetal position). If you have pain with activities such as standing for a long time or reaching with your arms, lie with your spine in a neutral position and bend your knees slightly. Try the following positions: Lying on your side with a pillow between your knees. Lying on your back with a pillow under your knees. Lifting  When lifting objects, keep your feet at least shoulder width apart and tighten your abdominal muscles. Bend your knees and hips and keep your spine neutral. It is important to lift using the strength of your legs, not your back. Do not lock your knees straight out. Always ask for help to lift heavy or awkward objects. This information is not intended to replace advice given to you by your health care provider. Make sure you discuss any questions you have with your health care provider. Document Revised: 05/04/2021 Document Reviewed: 05/04/2021 Elsevier Patient Education  [place]. Sciatica  Sciatica is pain,  numbness, weakness, or tingling along the path of the sciatic nerve. The sciatic nerve starts in the lower back and runs down the back of each leg. The nerve controls the muscles in the lower leg and in the back of the knee. It also provides feeling (sensation) to the back of the thigh, the lower leg, and the sole of the foot. Sciatica is a symptom of another medical condition that pinches or puts pressure on the sciatic nerve. Sciatica most often only affects one side of the body. Sciatica usually goes away on its own or with treatment. In some cases, sciatica may come back (recur). What are the causes? This condition is caused by pressure on the sciatic nerve or pinching of the nerve. This may be the result of: A disk in between the bones of the spine bulging out too far (herniated disk). Age-related changes in the spinal disks. A pain disorder  that affects a muscle in the buttock. Extra bone growth near the sciatic nerve. A break (fracture) of the pelvis. Pregnancy. Tumor. This is rare. What increases the risk? The following factors may make you more likely to develop this condition: Playing sports that place pressure or stress on the spine. Having poor strength and flexibility. A history of back injury or surgery. Sitting for long periods of time. Doing activities that involve repetitive bending or lifting. Obesity. What are the signs or symptoms? Symptoms can vary from mild to very severe. They may include: Any of the following problems in the lower back, leg, hip, or buttock: Mild tingling, numbness, or dull aches. Burning sensations. Sharp pains. Numbness in the back of the calf or the sole of the foot. Leg weakness. Severe back pain that makes movement difficult. Symptoms may get worse when you cough, sneeze, or laugh, or when you sit or stand for long periods of time. How is this diagnosed? This condition may be diagnosed based on: Your symptoms and medical history. A physical exam. Blood tests. Imaging tests, such as: X-rays. An MRI. A CT scan. How is this treated? In many cases, this condition improves on its own without treatment. However, treatment may include: Reducing or modifying physical activity. Exercising, including strengthening and stretching. Icing and applying heat to the affected area. Medicines that help to: Relieve pain and swelling. Relax your muscles. Injections of medicines that help to relieve pain and inflammation (steroids) around the sciatic nerve. Surgery. Follow these instructions at home: Medicines Take over-the-counter and prescription medicines only as told by your health care provider. Ask your health care provider if the medicine prescribed to you requires you to avoid driving or using heavy machinery. Managing pain     If directed, put ice on the affected area. To do  this: Put ice in a plastic bag. Place a towel between your skin and the bag. Leave the ice on for 20 minutes, 2-3 times a day. If your skin turns bright red, remove the ice right away to prevent skin damage. The risk of skin damage is higher if you cannot feel pain, heat, or cold. If directed, apply heat to the affected area as often as told by your health care provider. Use the heat source that your health care provider recommends, such as a moist heat pack or a heating pad. Place a towel between your skin and the heat source. Leave the heat on for 20-30 minutes. If your skin turns bright red, remove the heat right away to prevent burns. The risk of burns is  higher if you cannot feel pain, heat, or cold. Activity  Return to your normal activities as told by your health care provider. Ask your health care provider what activities are safe for you. Avoid activities that make your symptoms worse. Take brief periods of rest throughout the day. When you rest for longer periods, mix in some mild activity or stretching between periods of rest. This will help to prevent stiffness and pain. Avoid sitting for long periods of time without moving. Get up and move around at least one time each hour. Exercise and stretch regularly as told by your health care provider. Do not lift anything that is heavier than 10 lb (4.5 kg) until your health care provider says that it is safe. When you do not have symptoms, you should still avoid heavy lifting, especially repetitive heavy lifting. When you lift objects, always use proper lifting technique, which includes: Bending your knees. Keeping the load close to your body. Avoiding twisting. General instructions Maintain a healthy weight. Excess weight puts extra stress on your back. Wear supportive, comfortable shoes. Avoid wearing high heels. Avoid sleeping on a mattress that is too soft or too hard. A mattress that is firm enough to support your back when you  sleep may help to reduce your pain. Contact a health care provider if: Your pain is not controlled by medicine. Your pain does not improve or gets worse. Your pain lasts longer than 4 weeks. You have unexplained weight loss. Get help right away if: You are not able to control when you urinate or have bowel movements (incontinence). You have: Weakness in your lower back, pelvis, buttocks, or legs that gets worse. Redness or swelling of your back. A burning sensation when you urinate. Summary Sciatica is pain, numbness, weakness, or tingling along the path of the sciatic nerve, which may include the lower back, legs, hips, and buttocks. This condition is caused by pressure on the sciatic nerve or pinching of the nerve. Treatment often includes rest, exercise, medicines, and applying ice or heat. This information is not intended to replace advice given to you by your health care provider. Make sure you discuss any questions you have with your health care provider. Document Revised: 05/03/2021 Document Reviewed: 05/03/2021 Elsevier Patient Education  [place].

## 2021-10-14 NOTE — Progress Notes (Unsigned)
Assessment and Plan:  [name] was seen today for an episodic visit.  Diagnoses and all order for this visit:  1. Sciatica of left side Discussed ice/heat Stretching exercises  - dexamethasone (DECADRON) 4 MG tablet; Take 1 tab 3 x day - 3 days, then 2 x day - 3 days, then 1 tab daily  Dispense: 20 tablet; Refill: 0  2. Ischial bursitis of left side  - dexamethasone (DECADRON) 4 MG tablet; Take 1 tab 3 x day - 3 days, then 2 x day - 3 days, then 1 tab daily  Dispense: 20 tablet; Refill: 0  3. Acute bilateral low back pain with sciatica, sciatica laterality unspecified  - meloxicam (MOBIC) 7.5 MG tablet; Take 1 tablet (7.5 mg total) by mouth daily.  Dispense: 30 tablet; Refill: 0   Notify office for further evaluation and treatment, questions or concerns if s/s fail to improve. The risks and benefits of my recommendations, as well as other treatment options were discussed with the patient today. Questions were answered.  Further disposition pending results of labs. Discussed med's effects and SE's.    Over 20 minutes of exam, counseling, chart review, and critical decision making was performed.   Future Appointments  Date Time Provider [place]  12/20/2021  4:00 PM Darrol Jump, NP [facility] None  03/23/2022  3:00 PM Unk Pinto, MD [facility] None  06/22/2022  4:00 PM Shantai Tiedeman, Kenney Houseman, NP [facility] None    ------------------------------------------------------------------------------------------------------------------   HPI BP 102/62   Pulse 67   Temp 97.7 F (36.5 C)   Ht '5\' 11"'$  (1.803 m)   Wt 193 lb (87.5 kg)   SpO2 99%   BMI 26.92 kg/m   66 y.o.male presents for evaluation and management of bilateral low back pain with left side sciatica flare x 1 week.  He is unable to sit for long periods of time.  He will be traveling to the beach this weekend.  He has a donut seat.  Has tried OTC Tylenol and IBU without improvement.    Past Medical  History:  Diagnosis Date   ANXIETY 12/25/2007   ANXIETY STATE NEC 11/01/2006   CORONARY ARTERY DISEASE 08/02/2006   off Plavix x 3 years   DIVERTICULITIS OF COLON 01/06/2010   DVT, HX OF 08/02/2006   GERD with stricture 08/02/2006   History of thyroiditis 12/15/2018   Hx of adenomatous polyp of colon 06/09/2010   HYPERLIPIDEMIA 08/02/2006   HYPERTENSION 08/05/2008   Hypothyroidism 03/19/2014   Memory loss 07/21/2009   Pilonidal cyst    low back pain   Prostatitis 09/16/2015   Recurrent inguinal hernia    right     Allergies  Allergen Reactions   Penicillins     unspecified    Current Outpatient Medications on File Prior to Visit  Medication Sig   aspirin EC 81 MG tablet 81 mg daily.    atorvastatin (LIPITOR) 80 MG tablet TAKE 1 TABLET BY MOUTH EVERY DAY FOR CHOLESTEROL   Cholecalciferol (VITAMIN D) 125 MCG (5000 UT) CAPS Take 1 capsule by mouth daily. 10,000 units   esomeprazole (NEXIUM) 20 MG capsule Take 20 mg by mouth daily at 12 noon. Takes OTC 20 mg   finasteride (PROSCAR) 5 MG tablet TAKE 1 TABLE TDAILY FOR PROSTATE   hydrOXYzine (ATARAX) 25 MG tablet Take  1 to 2 tablets  1 hour  before Bedtime  as needed for Sleep   lisinopril (ZESTRIL) 10 MG tablet Take 1 tablet Daily for BP                                                 /  TAKE                                BY                             MOUTH                       ONCE DAILY   sildenafil (REVATIO) 20 MG tablet TAKE 1 TO 5 TABS BY MOUTH DAILY AS NEEDED.   No current facility-administered medications on file prior to visit.    ROS: all negative except what is noted in the HPI.   Physical Exam:  BP 102/62   Pulse 67   Temp 97.7 F (36.5 C)   Ht '5\' 11"'$  (1.803 m)   Wt 193 lb (87.5 kg)   SpO2 99%   BMI 26.92 kg/m   General Appearance: NAD.  Awake, conversant and cooperative. Eyes: PERRLA, EOMs intact.  Sclera white.  Conjunctiva without erythema. Sinuses: No  frontal/maxillary tenderness.  No nasal discharge. Nares patent.  ENT/Mouth: Ext aud canals clear.  Bilateral TMs w/DOL and without erythema or bulging. Hearing intact.  Posterior pharynx without swelling or exudate.  Tonsils without swelling or erythema.  Neck: Supple.  No masses, nodules or thyromegaly. Respiratory: Effort is regular with non-labored breathing. Breath sounds are equal bilaterally without rales, rhonchi, wheezing or stridor.  Cardio: RRR with no MRGs. Brisk peripheral pulses without edema.  Abdomen: Active BS in all four quadrants.  Soft and non-tender without guarding, rebound tenderness, hernias or masses. Lymphatics: Non tender without lymphadenopathy.  Musculoskeletal: LROM at waist d/t pain.  Left leg pain with abduction and adduction. 5/5 strength, normal ambulation.  No clubbing or cyanosis. Skin: Appropriate color for ethnicity. Warm without rashes, lesions, ecchymosis, ulcers.  Neuro: CN II-XII grossly normal. Normal muscle tone without cerebellar symptoms and intact sensation.   Psych: AO X 3,  appropriate mood and affect, insight and judgment.     Darrol Jump, NP 4:09 PM [facility]

## 2021-12-20 ENCOUNTER — Ambulatory Visit (INDEPENDENT_AMBULATORY_CARE_PROVIDER_SITE_OTHER): Payer: PPO | Admitting: Nurse Practitioner

## 2021-12-20 ENCOUNTER — Encounter: Payer: Self-pay | Admitting: Nurse Practitioner

## 2021-12-20 VITALS — BP 118/70 | HR 76 | Temp 97.7°F | Ht 71.0 in | Wt 193.0 lb

## 2021-12-20 DIAGNOSIS — I700 Atherosclerosis of aorta: Secondary | ICD-10-CM

## 2021-12-20 DIAGNOSIS — I25810 Atherosclerosis of coronary artery bypass graft(s) without angina pectoris: Secondary | ICD-10-CM

## 2021-12-20 DIAGNOSIS — Z86718 Personal history of other venous thrombosis and embolism: Secondary | ICD-10-CM | POA: Diagnosis not present

## 2021-12-20 DIAGNOSIS — M5440 Lumbago with sciatica, unspecified side: Secondary | ICD-10-CM

## 2021-12-20 DIAGNOSIS — G4700 Insomnia, unspecified: Secondary | ICD-10-CM | POA: Diagnosis not present

## 2021-12-20 DIAGNOSIS — E782 Mixed hyperlipidemia: Secondary | ICD-10-CM

## 2021-12-20 DIAGNOSIS — F419 Anxiety disorder, unspecified: Secondary | ICD-10-CM

## 2021-12-20 DIAGNOSIS — E559 Vitamin D deficiency, unspecified: Secondary | ICD-10-CM | POA: Diagnosis not present

## 2021-12-20 DIAGNOSIS — E663 Overweight: Secondary | ICD-10-CM

## 2021-12-20 DIAGNOSIS — Z79899 Other long term (current) drug therapy: Secondary | ICD-10-CM

## 2021-12-20 DIAGNOSIS — K219 Gastro-esophageal reflux disease without esophagitis: Secondary | ICD-10-CM

## 2021-12-20 DIAGNOSIS — I10 Essential (primary) hypertension: Secondary | ICD-10-CM

## 2021-12-20 MED ORDER — PREDNISONE 10 MG PO TABS: ORAL_TABLET | ORAL | 0 refills | Status: DC

## 2021-12-20 MED ORDER — MELOXICAM 7.5 MG PO TABS: 7.5000 mg | ORAL_TABLET | Freq: Every day | ORAL | 0 refills | Status: DC

## 2021-12-20 NOTE — Patient Instructions (Signed)

## 2021-12-20 NOTE — Progress Notes (Signed)
FOLLOW UP  Assessment:   Essential hypertension Discussed DASH (Dietary Approaches to Stop Hypertension) DASH diet is lower in sodium than a typical American diet. Cut back on foods that are high in saturated fat, cholesterol, and trans fats. Eat more whole-grain foods, fish, poultry, and nuts Remain active and exercise as tolerated daily.  Monitor BP at home-Call if greater than 130/80.  Check CMP/CBC   Mixed Hyperlipidemia/s/p/PTCA 2005/Atherosclerosis of coronary artery bypass graft of native heart without angina pectoris Discussed lifestyle modifications. Recommended diet heavy in fruits and veggies, omega 3's. Decrease consumption of animal meats, cheeses, and dairy products. Remain active and exercise as tolerated. Continue to monitor. Check lipids/TSH   Gastroesophageal reflux disease without esophagitis Continue Prevacid PRN. No suspected reflux complications (Barret/stricture). Lifestyle modification:  wt loss, avoid meals 2-3h before bedtime. Consider eliminating food triggers:  chocolate, caffeine, EtOH, acid/spicy food.  DVT, HX OF Monitor  Abnormal glucose Education: Reviewed 'ABCs' of diabetes management  Discussed goals to be met and/or maintained include A1C (<7) Blood pressure (<130/80) Cholesterol (LDL <70) Continue Dental Exam Q6 mo Discussed dietary recommendations Discussed Physical Activity recommendations Check and monitor glucose and A1C  Vitamin D deficiency At goal at recent check; c Continue to recommend supplementation for goal of 60-100  Overweight (BMI 25.0-29.9) Discussed appropriate BMI Goal of losing 1 lb per month. Diet modification. Physical activity. Encouraged/praised to build confidence.   Insomnia, unspecified type/Anxiety Will not refill Xanax. Failed Hydroxyzine Continue Buspar 5 mg HS   Acute bilateral low back pain, with sciatica. Continue Meloxicam 7.5 mg amy increase to BID (15 mg). Discussed taking with food.    Start Steroid Taper Currently on PPI. Stretching, alternate ice/heat. Donut seat.    Medication management Medication discussed in detail.  All questions and concerns addressed  Orders Placed This Encounter  Procedures   CBC with Differential/Platelet   COMPLETE METABOLIC PANEL WITH GFR   Lipid panel   Meds ordered this encounter  Medications   predniSONE (DELTASONE) 10 MG tablet    Sig: 1 tab 3 x day for 2 days, then 1 tab 2 x day for 2 days, then 1 tab 1 x day for 3 days    Dispense:  13 tablet    Refill:  0    Order Specific Question:   Supervising Provider    Answer:   Unk Pinto [[id number]]   meloxicam (MOBIC) 7.5 MG tablet    Sig: Take 1 tablet (7.5 mg total) by mouth daily.    Dispense:  30 tablet    Refill:  0    Order Specific Question:   Supervising Provider    Answer:   Unk Pinto [phone]    Notify office for further evaluation and treatment, questions or concerns if any reported s/s fail to improve.   The patient was advised to call back or seek an in-person evaluation if any symptoms worsen or if the condition fails to improve as anticipated.   Further disposition pending results of labs. Discussed med's effects and SE's.    I discussed the assessment and treatment plan with the patient. The patient was provided an opportunity to ask questions and all were answered. The patient agreed with the plan and demonstrated an understanding of the instructions.  Discussed med's effects and SE's. Screening labs and tests as requested with regular follow-up as recommended.  I provided 20 minutes of face-to-face time during this encounter including counseling, chart review, and critical decision making was preformed.   Future Appointments  Date Time Provider [place]  03/23/2022  3:00 PM Unk Pinto, MD [facility] None  06/22/2022  4:00 PM Darrol Jump, NP [facility] None    Subjective:  [name] is a 66 y.o. male who presents for 3  month follow up.   He is sleeping better with Buspar.  No longer takes Xanax.  Continues to endorse increase in left sided sciatica pain. Meloxicam and steroid taper are effective.  He does not sit or ride with wallet in the back pocket.  Has purchased a donut seat which helps. Worse when driving.  BMI is Body mass index is 26.92 kg/m., he has been working on diet, not intentionally exercising but very active all day, around home and garage/yard.  Wt Readings from Last 3 Encounters:  12/20/21 193 lb (87.5 kg)  10/14/21 193 lb (87.5 kg)  09/15/21 183 lb 9.6 oz (83.3 kg)   2008 s/p PCA/Stents with Dr. Harrington Challenger. Negative Cardiolite 2015. He has aortic atherosclerosis per CT 2020.   His blood pressure has been controlled at home, today their BP is BP: 118/70   He does not workout, does yard work. He denies chest pain, shortness of breath, dizziness.   He is on cholesterol medication (atoravstatin 80 mg daily) and denies myalgias. His cholesterol is at goal. Will forget to take occ.  The cholesterol last visit was:   Lab Results  Component Value Date   CHOL 135 09/15/2021   HDL 47 09/15/2021   LDLCALC 66 09/15/2021   LDLDIRECT 190.6 07/30/2010   TRIG 135 09/15/2021   CHOLHDL 2.9 09/15/2021    He has been working on diet and exercise for prediabetes, and denies polydipsia, polyuria and visual disturbances. Last A1C in the office was:  Lab Results  Component Value Date   HGBA1C 6.0 (H) 09/15/2021   He does push tea/water. Last GFR:  Lab Results  Component Value Date   GFRNONAA >60 09/11/2021   GFRNONAA 70 05/20/2020   GFRNONAA 50 (L) 02/17/2020   Patient is on Vitamin D supplement.   Lab Results  Component Value Date   VD25OH 103 (H) 09/15/2021      Medication Review:  Current Outpatient Medications (Endocrine & Metabolic):    dexamethasone (DECADRON) 4 MG tablet, Take 1 tab 3 x day - 3 days, then 2 x day - 3 days, then 1 tab daily (Patient not taking: Reported on  12/20/2021)  Current Outpatient Medications (Cardiovascular):    atorvastatin (LIPITOR) 80 MG tablet, TAKE 1 TABLET BY MOUTH EVERY DAY FOR CHOLESTEROL   lisinopril (ZESTRIL) 10 MG tablet, Take 1 tablet Daily for BP                                                 /                                                          TAKE                                BY  MOUTH                       ONCE DAILY   sildenafil (REVATIO) 20 MG tablet, TAKE 1 TO 5 TABS BY MOUTH DAILY AS NEEDED.   Current Outpatient Medications (Analgesics):    aspirin EC 81 MG tablet, 81 mg daily.    Current Outpatient Medications (Other):    Cholecalciferol (VITAMIN D) 125 MCG (5000 UT) CAPS, Take 1 capsule by mouth daily. 10,000 units   esomeprazole (NEXIUM) 20 MG capsule, Take 20 mg by mouth daily at 12 noon. Takes OTC 20 mg   finasteride (PROSCAR) 5 MG tablet, TAKE 1 TABLE TDAILY FOR PROSTATE   hydrOXYzine (ATARAX) 25 MG tablet, Take  1 to 2 tablets  1 hour  before Bedtime  as needed for Sleep   MAGNESIUM PO, Take by mouth.  Allergies: Allergies  Allergen Reactions   Penicillins     unspecified    Current Problems (verified) Patient Active Problem List   Diagnosis Date Noted   Aortic atherosclerosis ([place]) by Abd CT 09/27/2018 03/27/2019   Insomnia 02/21/2019   Overweight (BMI 25.0-29.9) 03/29/2017   Elevated PSA 09/16/2015   Abnormal glucose 07/24/2013   Vitamin D deficiency 07/24/2013   Medication management 07/24/2013   Hx of adenomatous polyp of colon 06/09/2010   Essential hypertension 08/05/2008   Hyperlipidemia, mixed 08/02/2006   ASCAD s/p PTCA/Stent (2005)  08/02/2006   Gastroesophageal reflux disease 08/02/2006   DVT, HX OF 08/02/2006    Screening Tests Immunization History  Administered Date(s) Administered   Influenza, High Dose Seasonal PF 12/22/2020   Influenza,inj,Quad PF,6+ Mos 12/07/2016, 11/21/2018   Influenza-Unspecified 01/28/2018   Moderna Sars-Covid-2  Vaccination 03/01/2019, 04/05/2019, 01/27/2020   PPD Test 07/24/2013, 07/30/2014, 09/16/2016, 10/23/2017   Tdap 07/30/2010    Patient Care Team: Unk Pinto, MD as PCP - General (Internal Medicine)  Surgical: Past Surgical History:  Procedure Laterality Date   COLONOSCOPY     COLONOSCOPY W/ POLYPECTOMY  06/16/2010   diminutive adenoma, diverticulosis, hemorrhoids   CORONARY ANGIOPLASTY WITH STENT PLACEMENT     ESOPHAGOGASTRODUODENOSCOPY  06/16/2010   antral ulcers, esophageal stricture - dilated, duodenitis, hiatal hernia   HERNIA REPAIR Right    INGUINAL HERNIA REPAIR Right 11/05/2018   Procedure: OPEN RIGHT INGUINAL HERNIA REPAIR WITH MESH;  Surgeon: Coralie Keens, MD;  Location: [facility];  Service: General;  Laterality: Right;  TAP BLOCK   PILONIDAL CYST EXCISION     teenager   POLYPECTOMY     UPPER GASTROINTESTINAL ENDOSCOPY     Family His family history includes Colon cancer in his father; Dementia in his mother. Social history  He reports that he has never smoked. He has been exposed to tobacco smoke. He has never used smokeless tobacco. He reports that he does not currently use alcohol. He reports that he does not use drugs.  Review of Systems  Constitutional:  Negative for malaise/fatigue and weight loss.  HENT:  Negative for hearing loss and tinnitus.   Eyes:  Negative for blurred vision and double vision.  Respiratory:  Negative for cough, sputum production, shortness of breath and wheezing.   Cardiovascular:  Negative for chest pain, palpitations, orthopnea, claudication, leg swelling and PND.  Gastrointestinal:  Negative for abdominal pain, blood in stool, constipation, diarrhea, heartburn, melena, nausea and vomiting.  Genitourinary: Negative.   Musculoskeletal:  Negative for falls, joint pain and myalgias.  Skin:  Negative for rash.  Neurological:  Negative for  dizziness, tingling, sensory change, weakness and headaches.   Endo/Heme/Allergies:  Negative for polydipsia.  Psychiatric/Behavioral: Negative.  Negative for depression, memory loss, substance abuse and suicidal ideas. The patient is not nervous/anxious and does not have insomnia.   All other systems reviewed and are negative.    Objective:     Today's Vitals   12/20/21 1554  BP: 118/70  Pulse: 76  Temp: 97.7 F (36.5 C)  SpO2: 98%  Weight: 193 lb (87.5 kg)  Height: _0  (1.803 m)   Body mass index is 26.92 kg/m.  General appearance: alert, no distress, WD/WN, male HEENT: normocephalic, sclerae anicteric, TMs pearly, nares patent, no discharge or erythema, pharynx normal Oral cavity: MMM, no lesions Neck: supple, no lymphadenopathy, no thyromegaly, no masses Heart: RRR, normal S1, S2, no murmurs Lungs: CTA bilaterally, no wheezes, rhonchi, or rales Abdomen: +bs, soft, non tender, non distended, no masses, no hepatomegaly, no splenomegaly Musculoskeletal: nontender, no swelling, no obvious deformity Extremities: no edema, no cyanosis, no clubbing Pulses: 2+ symmetric, upper and lower extremities, normal cap refill Neurological: alert, oriented x 3, CN2-12 intact, strength normal upper extremities and lower extremities, sensation normal throughout, DTRs 2+ throughout, no cerebellar signs, gait Normal Psychiatric: normal affect, behavior normal, pleasant   Ravindra Baranek, NP   12/20/2021

## 2021-12-21 LAB — CBC WITH DIFFERENTIAL/PLATELET
Absolute Monocytes: 638 cells/uL (ref 200–950)
Basophils Absolute: 61 cells/uL (ref 0–200)
Basophils Relative: 0.8 %
Eosinophils Absolute: 304 cells/uL (ref 15–500)
Eosinophils Relative: 4 %
HCT: 40.8 % (ref 38.5–50.0)
Hemoglobin: 13.9 g/dL (ref 13.2–17.1)
Lymphs Abs: 1892 cells/uL (ref 850–3900)
MCH: 30.4 pg (ref 27.0–33.0)
MCHC: 34.1 g/dL (ref 32.0–36.0)
MCV: 89.3 fL (ref 80.0–100.0)
MPV: 11.6 fL (ref 7.5–12.5)
Monocytes Relative: 8.4 %
Neutro Abs: 4704 cells/uL (ref 1500–7800)
Neutrophils Relative %: 61.9 %
Platelets: 286 10*3/uL (ref 140–400)
RBC: 4.57 10*6/uL (ref 4.20–5.80)
RDW: 13.7 % (ref 11.0–15.0)
Total Lymphocyte: 24.9 %
WBC: 7.6 10*3/uL (ref 3.8–10.8)

## 2021-12-21 LAB — COMPLETE METABOLIC PANEL WITH GFR
AG Ratio: 2.3 (calc) (ref 1.0–2.5)
ALT: 17 U/L (ref 9–46)
AST: 14 U/L (ref 10–35)
Albumin: 4.6 g/dL (ref 3.6–5.1)
Alkaline phosphatase (APISO): 58 U/L (ref 35–144)
BUN: 15 mg/dL (ref 7–25)
CO2: 26 mmol/L (ref 20–32)
Calcium: 10.2 mg/dL (ref 8.6–10.3)
Chloride: 108 mmol/L (ref 98–110)
Creat: 1.18 mg/dL (ref 0.70–1.35)
Globulin: 2 g/dL (calc) (ref 1.9–3.7)
Glucose, Bld: 87 mg/dL (ref 65–99)
Potassium: 5.1 mmol/L (ref 3.5–5.3)
Sodium: 142 mmol/L (ref 135–146)
Total Bilirubin: 0.4 mg/dL (ref 0.2–1.2)
Total Protein: 6.6 g/dL (ref 6.1–8.1)
eGFR: 68 mL/min/{1.73_m2} (ref >=60)

## 2021-12-21 LAB — LIPID PANEL
Cholesterol: 123 mg/dL (ref <200)
HDL: 52 mg/dL (ref >=40)
LDL Cholesterol (Calc): 56 mg/dL (calc)
Non-HDL Cholesterol (Calc): 71 mg/dL (calc) (ref <130)
Total CHOL/HDL Ratio: 2.4 (calc) (ref <5.0)
Triglycerides: 70 mg/dL (ref <150)

## 2021-12-23 ENCOUNTER — Other Ambulatory Visit: Payer: Self-pay | Admitting: Nurse Practitioner

## 2021-12-23 DIAGNOSIS — F419 Anxiety disorder, unspecified: Secondary | ICD-10-CM

## 2022-01-10 ENCOUNTER — Other Ambulatory Visit: Payer: Self-pay | Admitting: Internal Medicine

## 2022-01-10 DIAGNOSIS — M5440 Lumbago with sciatica, unspecified side: Secondary | ICD-10-CM

## 2022-01-10 MED ORDER — MELOXICAM 7.5 MG PO TABS: ORAL_TABLET | ORAL | 3 refills | Status: AC

## 2022-03-08 ENCOUNTER — Encounter: Payer: PPO | Admitting: Internal Medicine

## 2022-03-10 DIAGNOSIS — L814 Other melanin hyperpigmentation: Secondary | ICD-10-CM | POA: Diagnosis not present

## 2022-03-10 DIAGNOSIS — L821 Other seborrheic keratosis: Secondary | ICD-10-CM | POA: Diagnosis not present

## 2022-03-10 DIAGNOSIS — Z85828 Personal history of other malignant neoplasm of skin: Secondary | ICD-10-CM | POA: Diagnosis not present

## 2022-03-10 DIAGNOSIS — D4989 Neoplasm of unspecified behavior of other specified sites: Secondary | ICD-10-CM | POA: Diagnosis not present

## 2022-03-10 DIAGNOSIS — D0339 Melanoma in situ of other parts of face: Secondary | ICD-10-CM | POA: Diagnosis not present

## 2022-03-10 DIAGNOSIS — Z86006 Personal history of melanoma in-situ: Secondary | ICD-10-CM | POA: Diagnosis not present

## 2022-03-10 DIAGNOSIS — D229 Melanocytic nevi, unspecified: Secondary | ICD-10-CM | POA: Diagnosis not present

## 2022-03-10 DIAGNOSIS — D1801 Hemangioma of skin and subcutaneous tissue: Secondary | ICD-10-CM | POA: Diagnosis not present

## 2022-03-10 DIAGNOSIS — D485 Neoplasm of uncertain behavior of skin: Secondary | ICD-10-CM | POA: Diagnosis not present

## 2022-03-10 DIAGNOSIS — L820 Inflamed seborrheic keratosis: Secondary | ICD-10-CM | POA: Diagnosis not present

## 2022-03-10 DIAGNOSIS — L578 Other skin changes due to chronic exposure to nonionizing radiation: Secondary | ICD-10-CM | POA: Diagnosis not present

## 2022-03-21 DIAGNOSIS — D0339 Melanoma in situ of other parts of face: Secondary | ICD-10-CM | POA: Diagnosis not present

## 2022-03-21 DIAGNOSIS — D0330 Melanoma in situ of unspecified part of face: Secondary | ICD-10-CM | POA: Diagnosis not present

## 2022-03-22 DIAGNOSIS — D0330 Melanoma in situ of unspecified part of face: Secondary | ICD-10-CM | POA: Diagnosis not present

## 2022-03-23 ENCOUNTER — Encounter: Payer: Self-pay | Admitting: Internal Medicine

## 2022-03-23 ENCOUNTER — Ambulatory Visit (INDEPENDENT_AMBULATORY_CARE_PROVIDER_SITE_OTHER): Payer: PPO | Admitting: Internal Medicine

## 2022-03-23 DIAGNOSIS — I10 Essential (primary) hypertension: Secondary | ICD-10-CM

## 2022-03-23 DIAGNOSIS — Z0000 Encounter for general adult medical examination without abnormal findings: Secondary | ICD-10-CM

## 2022-03-23 DIAGNOSIS — E559 Vitamin D deficiency, unspecified: Secondary | ICD-10-CM | POA: Diagnosis not present

## 2022-03-23 DIAGNOSIS — N138 Other obstructive and reflux uropathy: Secondary | ICD-10-CM | POA: Diagnosis not present

## 2022-03-23 DIAGNOSIS — R7309 Other abnormal glucose: Secondary | ICD-10-CM | POA: Diagnosis not present

## 2022-03-23 DIAGNOSIS — Z79899 Other long term (current) drug therapy: Secondary | ICD-10-CM | POA: Diagnosis not present

## 2022-03-23 DIAGNOSIS — N401 Enlarged prostate with lower urinary tract symptoms: Secondary | ICD-10-CM | POA: Diagnosis not present

## 2022-03-23 DIAGNOSIS — Z136 Encounter for screening for cardiovascular disorders: Secondary | ICD-10-CM

## 2022-03-23 DIAGNOSIS — Z125 Encounter for screening for malignant neoplasm of prostate: Secondary | ICD-10-CM | POA: Diagnosis not present

## 2022-03-23 DIAGNOSIS — E782 Mixed hyperlipidemia: Secondary | ICD-10-CM | POA: Diagnosis not present

## 2022-03-23 DIAGNOSIS — I700 Atherosclerosis of aorta: Secondary | ICD-10-CM

## 2022-03-23 DIAGNOSIS — K219 Gastro-esophageal reflux disease without esophagitis: Secondary | ICD-10-CM | POA: Diagnosis not present

## 2022-03-23 NOTE — Patient Instructions (Signed)

## 2022-03-23 NOTE — Progress Notes (Signed)
R  E  S  C  H  E  D  U  L  E  D                                                                                                                                                                                                                                                                                                       .  Annual  Screening/Preventative Visit  & Comprehensive Evaluation & Examination  Future Appointments  Date Time Provider Department  03/23/2022  3:00 PM Unk Pinto, MD [facility]  06/22/2022  4:00 PM Darrol Jump, NP [facility]  03/29/2023  3:00 PM Unk Pinto, MD [facility]              This very nice 67 y.o. MWM presents for a Screening /Preventative Visit & comprehensive evaluation and management of multiple medical co-morbidities.  Patient has been followed for HTN, HLD, Prediabetes and Vitamin D Deficiency.  In Aug 2020, Abdominal CT scan showed Aortic Atherosclerosis .       HTN predates since 2008.  In 2008, patient had PCA/Stents x 2 by Dr Dorris Carnes. Patient's BP has been controlled at home.  Today's BP is at goal - 124/86.  In 2015 , Cardiolite was Negative. Patient denies any cardiac symptoms as chest pain, palpitations, shortness of breath, dizziness or ankle swelling.       Patient's hyperlipidemia is controlled with diet & Atorvastatin Patient denies myalgias or other medication SE's. Last lipids were  Lab Results  Component Value Date   CHOL 167 08/24/2020   HDL 47 08/24/2020   LDLCALC 95 08/24/2020   LDLDIRECT 190.6 07/30/2010   TRIG 156 (H) 08/24/2020   CHOLHDL 3.6 08/24/2020         Patient has hx/o prediabetes (A1c 6.1% /2014 & 6.3%  /2015) and patient denies reactive hypoglycemic symptoms, visual blurring, diabetic polys or paresthesias. Last A1c was normal & at goal:  Lab Results  Component Value Date   HGBA1C 5.6 08/24/2020  Finally, patient has history of Vitamin D Deficiency ("43" /2014 & "30" /2016) and last vitamin D was at goal :   Lab Results  Component Value Date   VD25OH 73 08/24/2020     Current Outpatient Medications on File Prior to Visit  Medication Sig   ALPRAZolam  0.5 MG tablet TAKE 1/2 TO 1 TABLET AT BEDTIME IF NEEDED FOR SLEEP.   aspirin EC 81 MG tablet Take daily.    atorvastatin 80 MG tablet Take 1 tablet Daily for Cholesterol   VITAMIN D  5000 u Take 1 capsule  daily.   escitalopram  10 MG tablet Take 1 tablet Daily for Mood   esomeprazole  20 MG capsule Take 20 mg  daily at 12 noon.    finasteride  5 MG tablet TAKE 1 TABLE T  DAILY FOR PROSTATE   gabapentin 100 MG capsule TAKE 1 CAPSULE 3 X DAY AS NEEDED    lisinopril 10 MG tablet Take 1 tablet  Daily for BP                         sildenafil 20 MG tablet TAKE 1 TO 5 TABS DAILY AS NEEDED.    Allergies  Allergen Reactions   Penicillins     Past Medical History:  Diagnosis Date   ANXIETY 12/25/2007   ANXIETY STATE NEC 11/01/2006   CORONARY ARTERY DISEASE 08/02/2006   off Plavix x 3 years   DIVERTICULITIS OF COLON 01/06/2010   DVT, HX OF 08/02/2006   GERD with stricture 08/02/2006   History of thyroiditis 12/15/2018   Hx of adenomatous polyp of colon 06/09/2010   HYPERLIPIDEMIA 08/02/2006   HYPERTENSION 08/05/2008   Hypothyroidism 03/19/2014   Memory loss 07/21/2009   Pilonidal cyst    low back pain   Prostatitis 09/16/2015   Recurrent inguinal hernia    right     Health Maintenance  Topic Date Due   Pneumonia Vaccine 48+ Years old (1 - PCV) Never done   Zoster Vaccines- Shingrix (1 of 2) Never done   COVID-19 Vaccine (4 - Booster for Moderna series) 03/23/2020   TETANUS/TDAP  07/29/2020   COLONOSCOPY  08/10/2022    INFLUENZA VACCINE  Completed   Hepatitis C Screening  Completed   HPV VACCINES  Aged Out     Immunization History  Administered Date(s) Administered   Influenza, High Dose  12/22/2020   Influenza,inj,Quad  12/07/2016, 11/21/2018   Influenza- 01/28/2018   Moderna Sars-Covid-2 Vacc 03/01/2019, 04/05/2019, 01/27/2020   PPD Test 09/16/2016, 10/23/2017   Tdap 07/30/2010    Last Colon - 08/09/2017 - Dr Carlean Purl recc 5 yr f/u - July 2024   Past Surgical History:  Procedure Laterality Date   COLONOSCOPY     COLONOSCOPY W/ POLYPECTOMY  06/16/2010   diminutive adenoma, diverticulosis, hemorrhoids   CORONARY ANGIOPLASTY WITH STENT PLACEMENT     ESOPHAGOGASTRODUODENOSCOPY  06/16/2010   antral ulcers, esophageal stricture - dilated, duodenitis, hiatal hernia   HERNIA REPAIR Right    INGUINAL HERNIA REPAIR Right 11/05/2018   Procedure: OPEN RIGHT INGUINAL HERNIA REPAIR WITH MESH;  Surgeon: Coralie Keens, MD;  Location: [facility];  Service: General;  Laterality: Right;  TAP BLOCK   PILONIDAL CYST EXCISION     teenager   POLYPECTOMY     UPPER GASTROINTESTINAL ENDOSCOPY       Family History  Problem Relation Age of Onset   Colon cancer Father  deceased age 63   Dementia Mother    Breast cancer Neg Hx    Colon polyps Neg Hx    Esophageal cancer Neg Hx    Liver cancer Neg Hx    Pancreatic cancer Neg Hx    Rectal cancer Neg Hx    Stomach cancer Neg Hx      Social History   Tobacco Use   Smoking status: Never   Smokeless tobacco: Never  Vaping Use   Vaping Use: Never used  Substance Use Topics   Alcohol use: Yes    Comment: approx 2 drinks per week   Drug use: Never      ROS Constitutional: Denies fever, chills, weight loss/gain, headaches, insomnia,  night sweats or change in appetite. Does c/o fatigue. Eyes: Denies redness, blurred vision, diplopia, discharge, itchy or watery eyes.  ENT: Denies discharge, congestion, post nasal drip, epistaxis,  sore throat, earache, hearing loss, dental pain, Tinnitus, Vertigo, Sinus pain or snoring.  Cardio: Denies chest pain, palpitations, irregular heartbeat, syncope, dyspnea, diaphoresis, orthopnea, PND, claudication or edema Respiratory: denies cough, dyspnea, DOE, pleurisy, hoarseness, laryngitis or wheezing.  Gastrointestinal: Denies dysphagia, heartburn, reflux, water brash, pain, cramps, nausea, vomiting, bloating, diarrhea, constipation, hematemesis, melena, hematochezia, jaundice or hemorrhoids Genitourinary: Denies dysuria, frequency, urgency, nocturia, hesitancy, discharge, hematuria or flank pain Musculoskeletal: Denies arthralgia, myalgia, stiffness, Jt. Swelling, pain, limp or strain/sprain. Denies Falls. Skin: Denies puritis, rash, hives, warts, acne, eczema or change in skin lesion Neuro: No weakness, tremor, incoordination, spasms, paresthesia or pain Psychiatric: Denies confusion, memory loss or sensory loss. Denies Depression. Endocrine: Denies change in weight, skin, hair change, nocturia, and paresthesia, diabetic polys, visual blurring or hyper / hypo glycemic episodes.  Heme/Lymph: No excessive bleeding, bruising or enlarged lymph nodes.   Physical Exam  There were no vitals taken for this visit.  General Appearance: Well nourished and well groomed and in no apparent distress.  Eyes: PERRLA, EOMs, conjunctiva no swelling or erythema, normal fundi and vessels. Sinuses: No frontal/maxillary tenderness ENT/Mouth: EACs patent / TMs  nl. Nares clear without erythema, swelling, mucoid exudates. Oral hygiene is good. No erythema, swelling, or exudate. Tongue normal, non-obstructing. Tonsils not swollen or erythematous. Hearing normal.  Neck: Supple, thyroid not palpable. No bruits, nodes or JVD. Respiratory: Respiratory effort normal.  BS equal and clear bilateral without rales, rhonci, wheezing or stridor. Cardio: Heart sounds are normal with regular rate and rhythm and no  murmurs, rubs or gallops. Peripheral pulses are normal and equal bilaterally without edema. No aortic or femoral bruits. Chest: symmetric with normal excursions and percussion.  Abdomen: Soft, with Nl bowel sounds. Nontender, no guarding, rebound, hernias, masses, or organomegaly.  Lymphatics: Non tender without lymphadenopathy.  Musculoskeletal: Full ROM all peripheral extremities, joint stability, 5/5 strength, and normal gait. Skin: Warm and dry without rashes, lesions, cyanosis, clubbing or  ecchymosis.  Neuro: Cranial nerves intact, reflexes equal bilaterally. Normal muscle tone, no cerebellar symptoms. Sensation intact.  Pysch: Alert and oriented X 3 with normal affect, insight and judgment appropriate.   Assessment and Plan  1. Annual Preventative/Screening Exam    2. Essential hypertension  - EKG 12-Lead - [place], RETROPERITNL ABD,  LTD - Urinalysis, Routine w reflex microscopic - Microalbumin / creatinine urine ratio - CBC with Differential/Platelet - COMPLETE METABOLIC PANEL WITH GFR - Magnesium  3. Hyperlipidemia, mixed  - EKG 12-Lead - [place], RETROPERITNL ABD,  LTD - Lipid panel  4. Abnormal glucose  - EKG 12-Lead - [place], RETROPERITNL ABD,  LTD - Hemoglobin A1c - Insulin, random  5. Vitamin D deficiency  - VITAMIN D 25 Hydroxy   6. Aortic atherosclerosis ([place]) by Abd CT 09/27/2018  - EKG 12-Lead - [place], RETROPERITNL ABD,  LTD  7. History of ASCVD (atherosclerotic cardiovascular disease)  - EKG 12-Lead  8. Gastroesophageal reflux disease without esophagitis  - CBC with Differential/Platelet  9. BPH with obstruction/lower urinary tract symptoms  - PSA  10. Screening for colorectal cancer  - POC Hemoccult Bld/Stl   11. Prostate cancer screening - PSA  12. Screening for ischemic heart disease  - EKG 12-Lead  13. Screening for AAA (aortic abdominal aneurysm)  - [place], RETROPERITNL ABD,  LTD  14. FH: hypertension  - EKG 12-Lead - [place], RETROPERITNL  ABD,  LTD  15. Arteriosclerotic cardiovascular disease (ASCVD)  - EKG 12-Lead  16. Medication management  - Urinalysis, Routine w reflex microscopic - Microalbumin / creatinine urine ratio - CBC with Differential/Platelet - COMPLETE METABOLIC PANEL WITH GFR - Magnesium - Lipid panel - TSH - Hemoglobin A1c - Insulin, random - VITAMIN D 25 Hydroxy   17. Primary insomnia  - hydrOXYzine (ATARAX) 25 MG tablet;  Take  1 to 2 tablets  1 hour  before Bedtime  as needed for Sleep   Dispense: 180 tablet; Refill: 3          Patient was counseled in prudent diet, weight control to achieve/maintain BMI less than 25, BP monitoring, regular exercise and medications as discussed.  Discussed med effects and SE's. Routine screening labs and tests as requested with regular follow-up as recommended. Over 40 minutes of exam, counseling, chart review and high complex critical decision making was performed   Kirtland Bouchard, MD

## 2022-03-24 LAB — CBC WITH DIFFERENTIAL/PLATELET
Absolute Monocytes: 648 cells/uL (ref 200–950)
Basophils Absolute: 49 cells/uL (ref 0–200)
Basophils Relative: 0.6 %
Eosinophils Absolute: 203 cells/uL (ref 15–500)
Eosinophils Relative: 2.5 %
HCT: 39 % (ref 38.5–50.0)
Hemoglobin: 13.5 g/dL (ref 13.2–17.1)
Lymphs Abs: 1693 cells/uL (ref 850–3900)
MCH: 30.9 pg (ref 27.0–33.0)
MCHC: 34.6 g/dL (ref 32.0–36.0)
MCV: 89.2 fL (ref 80.0–100.0)
MPV: 12.1 fL (ref 7.5–12.5)
Monocytes Relative: 8 %
Neutro Abs: 5508 cells/uL (ref 1500–7800)
Neutrophils Relative %: 68 %
Platelets: 285 10*3/uL (ref 140–400)
RBC: 4.37 10*6/uL (ref 4.20–5.80)
RDW: 12.9 % (ref 11.0–15.0)
Total Lymphocyte: 20.9 %
WBC: 8.1 10*3/uL (ref 3.8–10.8)

## 2022-03-24 LAB — COMPLETE METABOLIC PANEL WITH GFR
AG Ratio: 2.3 (calc) (ref 1.0–2.5)
ALT: 16 U/L (ref 9–46)
AST: 14 U/L (ref 10–35)
Albumin: 4.4 g/dL (ref 3.6–5.1)
Alkaline phosphatase (APISO): 59 U/L (ref 35–144)
BUN: 19 mg/dL (ref 7–25)
CO2: 23 mmol/L (ref 20–32)
Calcium: 9.7 mg/dL (ref 8.6–10.3)
Chloride: 107 mmol/L (ref 98–110)
Creat: 1.18 mg/dL (ref 0.70–1.35)
Globulin: 1.9 g/dL (calc) (ref 1.9–3.7)
Glucose, Bld: 90 mg/dL (ref 65–99)
Potassium: 4.7 mmol/L (ref 3.5–5.3)
Sodium: 140 mmol/L (ref 135–146)
Total Bilirubin: 0.4 mg/dL (ref 0.2–1.2)
Total Protein: 6.3 g/dL (ref 6.1–8.1)
eGFR: 67 mL/min/{1.73_m2} (ref >=60)

## 2022-03-24 LAB — URINALYSIS, ROUTINE W REFLEX MICROSCOPIC
Bilirubin Urine: NEGATIVE
Glucose, UA: NEGATIVE
Hgb urine dipstick: NEGATIVE
Ketones, ur: NEGATIVE
Leukocytes,Ua: NEGATIVE
Nitrite: NEGATIVE
Protein, ur: NEGATIVE
Specific Gravity, Urine: 1.025 (ref 1.001–1.035)
pH: 5.5 (ref 5.0–8.0)

## 2022-03-24 LAB — MICROALBUMIN / CREATININE URINE RATIO
Creatinine, Urine: 153 mg/dL (ref 20–320)
Microalb Creat Ratio: 3 mcg/mg creat (ref <30)
Microalb, Ur: 0.4 mg/dL

## 2022-03-24 LAB — HEMOGLOBIN A1C
Hgb A1c MFr Bld: 5.9 % of total Hgb — ABNORMAL HIGH (ref <5.7)
Mean Plasma Glucose: 123 mg/dL
eAG (mmol/L): 6.8 mmol/L

## 2022-03-24 LAB — VITAMIN D 25 HYDROXY (VIT D DEFICIENCY, FRACTURES): Vit D, 25-Hydroxy: 65 ng/mL (ref 30–100)

## 2022-03-24 LAB — LIPID PANEL
Cholesterol: 125 mg/dL (ref <200)
HDL: 44 mg/dL (ref >=40)
LDL Cholesterol (Calc): 64 mg/dL (calc)
Non-HDL Cholesterol (Calc): 81 mg/dL (calc) (ref <130)
Total CHOL/HDL Ratio: 2.8 (calc) (ref <5.0)
Triglycerides: 90 mg/dL (ref <150)

## 2022-03-24 LAB — INSULIN, RANDOM: Insulin: 10 u[IU]/mL

## 2022-03-24 LAB — MAGNESIUM: Magnesium: 2 mg/dL (ref 1.5–2.5)

## 2022-03-24 LAB — PSA: PSA: 0.8 ng/mL (ref <=4.00)

## 2022-03-24 LAB — TSH: TSH: 3.24 mIU/L (ref 0.40–4.50)

## 2022-03-24 NOTE — Progress Notes (Signed)
<><><><><><><><><><><><><><><><><><><><><><><><><><><><><><><><><> <><><><><><><><><><><><><><><><><><><><><><><><><><><><><><><><><> -   Test results slightly outside the reference range are not unusual. If there is anything important, I will review this with you,  otherwise it is considered normal test values.  If you have further questions,  please do not hesitate to contact me at the office or via My Chart.  <><><><><><><><><><><><><><><><><><><><><><><><><><><><><><><><><> <><><><><><><><><><><><><><><><><><><><><><><><><><><><><><><><><>  -  A1c = 5.9 % - Almost back to Normal nonDiabetic range less than 5.7%   <><><><><><><><><><><><><><><><><><><><><><><><><><><><><><><><><>  - PSA - Low - Great !      - No Prostate Cancer   ! <><><><><><><><><><><><><><><><><><><><><><><><><><><><><><><><><>  - Total Chol = 125    &   LDL Chol = 64     -      Both  Excellent   - Very low risk for Heart Attack  / Stroke <><><><><><><><><><><><><><><><><><><><><><><><><><><><><><><><><>  - Vitamin D = 65 -  Great - Please keep dose same  <><><><><><><><><><><><><><><><><><><><><><><><><><><><><><><><><>  - All Else - CBC - Kidneys - Electrolytes - Liver - Magnesium & Thyroid    - all  Normal / OK <><><><><><><><><><><><><><><><><><><><><><><><><><><><><><><><><> <><><><><><><><><><><><><><><><><><><><><><><><><><><><><><><><><>

## 2022-04-04 ENCOUNTER — Encounter: Payer: PPO | Admitting: Internal Medicine

## 2022-04-20 NOTE — Progress Notes (Unsigned)
.    Annual  Screening/Preventative Visit  & Comprehensive Evaluation & Examination  Future Appointments  Date Time Provider Department  03/23/2022  3:00 PM Unk Pinto, MD [facility]  06/22/2022  4:00 PM Darrol Jump, NP [facility]  03/29/2023  3:00 PM Unk Pinto, MD [facility]         This very nice 67 y.o. MWM presents for a Screening /Preventative Visit & comprehensive evaluation and management of multiple medical co-morbidities.  Patient has been followed for HTN, HLD, Prediabetes and Vitamin D Deficiency.  In Aug 2020, Abdominal CT scan showed Aortic Atherosclerosis .       HTN predates since 2008.  In 2008, patient had PCA/Stents x 2 by Dr Dorris Carnes. Patient's BP has been controlled at home.  Today's BP is at goal -  118/70 .  In 2015 , Cardiolite was Negative. Patient denies any cardiac symptoms as chest pain, palpitations, shortness of breath, dizziness or ankle swelling.       Patient's hyperlipidemia is controlled with diet & Atorvastatin Patient denies myalgias or other medication SE's.   Recent lipids were at goal :  Lab Results  Component Value Date   CHOL 125 03/23/2022   HDL 44 03/23/2022   LDLCALC 64 03/23/2022   LDLDIRECT 190.6 07/30/2010   TRIG 90 03/23/2022   CHOLHDL 2.8 03/23/2022         Patient has hx/o prediabetes (A1c 6.1% /2014 & 6.3% /2015) and patient denies reactive hypoglycemic symptoms, visual blurring, diabetic polys or paresthesias. Last A1c was near goal :  Lab Results  Component Value Date   HGBA1C 5.9 (H) 03/23/2022         Finally, patient has history of Vitamin D Deficiency ("43" /2014 & "30" /2016) and last vitamin D was at goal :   Lab Results  Component Value Date   VD25OH 65 03/23/2022       Current Outpatient Medications on File Prior to Visit  Medication Sig   ALPRAZolam  0.5 MG tablet TAKE 1/2 TO 1 TABLET AT BEDTIME IF NEEDED FOR SLEEP.   aspirin EC 81 MG tablet Take daily.    atorvastatin 80 MG tablet Take  1 tablet Daily for Cholesterol   VITAMIN D  5000 u Take 1 capsule  daily.   escitalopram  10 MG tablet Take 1 tablet Daily for Mood   esomeprazole  20 MG capsule Take 20 mg  daily at 12 noon.    finasteride  5 MG tablet TAKE 1 TABLE T  DAILY FOR PROSTATE   gabapentin 100 MG capsule TAKE 1 CAPSULE 3 X DAY AS NEEDED    lisinopril 10 MG tablet Take 1 tablet  Daily for BP                         sildenafil 20 MG tablet TAKE 1 TO 5 TABS DAILY AS NEEDED.    Allergies  Allergen Reactions   Penicillins     Past Medical History:  Diagnosis Date   ANXIETY 12/25/2007   ANXIETY STATE NEC 11/01/2006   CORONARY ARTERY DISEASE 08/02/2006   off Plavix x 3 years   DIVERTICULITIS OF COLON 01/06/2010   DVT, HX OF 08/02/2006   GERD with stricture 08/02/2006   History of thyroiditis 12/15/2018   Hx of adenomatous polyp of colon 06/09/2010   HYPERLIPIDEMIA 08/02/2006   HYPERTENSION 08/05/2008   Hypothyroidism 03/19/2014   Memory loss 07/21/2009   Pilonidal cyst    low  back pain   Prostatitis 09/16/2015   Recurrent inguinal hernia    right     Health Maintenance  Topic Date Due   Pneumonia Vaccine 78+ Years old (1 - PCV) Never done   Zoster Vaccines- Shingrix (1 of 2) Never done   COVID-19 Vaccine (4 - Booster for Moderna series) 03/23/2020   TETANUS/TDAP  07/29/2020   COLONOSCOPY  08/10/2022   INFLUENZA VACCINE  Completed   Hepatitis C Screening  Completed   HPV VACCINES  Aged Out     Immunization History  Administered Date(s) Administered   Influenza, High Dose  12/22/2020   Influenza,inj,Quad  12/07/2016, 11/21/2018   Influenza- 01/28/2018   Moderna Sars-Covid-2 Vacc 03/01/2019, 04/05/2019, 01/27/2020   PPD Test 09/16/2016, 10/23/2017   Tdap 07/30/2010    Last Colon - 08/09/2017 - Dr Carlean Purl recc 5 yr f/u - July 2024   Past Surgical History:  Procedure Laterality Date   COLONOSCOPY     COLONOSCOPY W/ POLYPECTOMY  06/16/2010   diminutive adenoma, diverticulosis, hemorrhoids    CORONARY ANGIOPLASTY WITH STENT PLACEMENT     ESOPHAGOGASTRODUODENOSCOPY  06/16/2010   antral ulcers, esophageal stricture - dilated, duodenitis, hiatal hernia   HERNIA REPAIR Right    INGUINAL HERNIA REPAIR Right 11/05/2018   Procedure: OPEN RIGHT INGUINAL HERNIA REPAIR WITH MESH;  Surgeon: Coralie Keens, MD;  Location: [facility];  Service: General;  Laterality: Right;  TAP BLOCK   PILONIDAL CYST EXCISION     teenager   POLYPECTOMY     UPPER GASTROINTESTINAL ENDOSCOPY       Family History  Problem Relation Age of Onset   Colon cancer Father        deceased age 55   Dementia Mother    Breast cancer Neg Hx    Colon polyps Neg Hx    Esophageal cancer Neg Hx    Liver cancer Neg Hx    Pancreatic cancer Neg Hx    Rectal cancer Neg Hx    Stomach cancer Neg Hx      Social History   Tobacco Use   Smoking status: Never   Smokeless tobacco: Never  Vaping Use   Vaping Use: Never used  Substance Use Topics   Alcohol use: Yes    Comment: approx 2 drinks per week   Drug use: Never      ROS Constitutional: Denies fever, chills, weight loss/gain, headaches, insomnia,  night sweats or change in appetite. Does c/o fatigue. Eyes: Denies redness, blurred vision, diplopia, discharge, itchy or watery eyes.  ENT: Denies discharge, congestion, post nasal drip, epistaxis, sore throat, earache, hearing loss, dental pain, Tinnitus, Vertigo, Sinus pain or snoring.  Cardio: Denies chest pain, palpitations, irregular heartbeat, syncope, dyspnea, diaphoresis, orthopnea, PND, claudication or edema Respiratory: denies cough, dyspnea, DOE, pleurisy, hoarseness, laryngitis or wheezing.  Gastrointestinal: Denies dysphagia, heartburn, reflux, water brash, pain, cramps, nausea, vomiting, bloating, diarrhea, constipation, hematemesis, melena, hematochezia, jaundice or hemorrhoids Genitourinary: Denies dysuria, frequency, urgency, nocturia, hesitancy, discharge, hematuria or flank  pain Musculoskeletal: Denies arthralgia, myalgia, stiffness, Jt. Swelling, pain, limp or strain/sprain. Denies Falls. Skin: Denies puritis, rash, hives, warts, acne, eczema or change in skin lesion Neuro: No weakness, tremor, incoordination, spasms, paresthesia or pain Psychiatric: Denies confusion, memory loss or sensory loss. Denies Depression. Endocrine: Denies change in weight, skin, hair change, nocturia, and paresthesia, diabetic polys, visual blurring or hyper / hypo glycemic episodes.  Heme/Lymph: No excessive bleeding, bruising or enlarged lymph nodes.   Physical Exam  BP  118/70   Pulse 74   Temp 97.9 F (36.6 C)   Resp 17   Ht '5\' 11"'$  (1.803 m)   Wt 194 lb 3.2 oz (88.1 kg)   SpO2 99%   BMI 27.09 kg/m   General Appearance: Well nourished and well groomed and in no apparent distress.  Eyes: PERRLA, EOMs, conjunctiva no swelling or erythema, normal fundi and vessels. Sinuses: No frontal/maxillary tenderness ENT/Mouth: EACs patent / TMs  nl. Nares clear without erythema, swelling, mucoid exudates. Oral hygiene is good. No erythema, swelling, or exudate. Tongue normal, non-obstructing. Tonsils not swollen or erythematous. Hearing normal.  Neck: Supple, thyroid not palpable. No bruits, nodes or JVD. Respiratory: Respiratory effort normal.  BS equal and clear bilateral without rales, rhonci, wheezing or stridor. Cardio: Heart sounds are normal with regular rate and rhythm and no murmurs, rubs or gallops. Peripheral pulses are normal and equal bilaterally without edema. No aortic or femoral bruits. Chest: symmetric with normal excursions and percussion.  Abdomen: Soft, with Nl bowel sounds. Nontender, no guarding, rebound, hernias, masses, or organomegaly.  Lymphatics: Non tender without lymphadenopathy.  Musculoskeletal: Full ROM all peripheral extremities, joint stability, 5/5 strength, and normal gait. Skin: Warm and dry without rashes, lesions, cyanosis, clubbing or   ecchymosis.  Neuro: Cranial nerves intact, reflexes equal bilaterally. Normal muscle tone, no cerebellar symptoms. Sensation intact.  Pysch: Alert and oriented X 3 with normal affect, insight and judgment appropriate.   Assessment and Plan  1. Annual Preventative/Screening Exam    2. Essential hypertension  - EKG 12-Lead - [place], RETROPERITNL ABD,  LTD   3. Hyperlipidemia, mixed  - EKG 12-Lead - [place], RETROPERITNL ABD,  LTD   4. Abnormal glucose  - EKG 12-Lead - [place], RETROPERITNL ABD,  LTD   5. Vitamin D deficiency   6. Aortic atherosclerosis ([place]) by Abd CT 09/27/2018  - EKG 12-Lead - [place], RETROPERITNL ABD,  LTD  7. History of ASCVD (atherosclerotic cardiovascular disease)  - EKG 12-Lead  8. Gastroesophageal reflux disease without esophagitis   9. BPH with obstruction/lower urinary tract symptoms   10. Screening for colorectal cancer   11. Prostate cancer screening   12. Screening for ischemic heart disease  - EKG 12-Lead  13. Screening for AAA (aortic abdominal aneurysm)  - [place], RETROPERITNL ABD,  LTD  14. FH: hypertension  - EKG 12-Lead - [place], RETROPERITNL ABD,  LTD  15. Arteriosclerotic cardiovascular disease (ASCVD)  - EKG 12-Lead  16. Medication management         Patient was counseled in prudent diet, weight control to achieve/maintain BMI less than 25, BP monitoring, regular exercise and medications as discussed.  Discussed med effects and SE's. Routine screening labs and tests as requested with regular follow-up as recommended. Over 40 minutes of exam, counseling, chart review and high complex critical decision making was performed   Kirtland Bouchard, MD

## 2022-04-20 NOTE — Patient Instructions (Signed)

## 2022-04-21 ENCOUNTER — Encounter: Payer: Self-pay | Admitting: Internal Medicine

## 2022-04-21 ENCOUNTER — Ambulatory Visit (INDEPENDENT_AMBULATORY_CARE_PROVIDER_SITE_OTHER): Payer: PPO | Admitting: Internal Medicine

## 2022-04-21 VITALS — BP 118/70 | HR 74 | Temp 97.9°F | Resp 17 | Ht 71.0 in | Wt 194.2 lb

## 2022-04-21 DIAGNOSIS — Z8249 Family history of ischemic heart disease and other diseases of the circulatory system: Secondary | ICD-10-CM

## 2022-04-21 DIAGNOSIS — Z8679 Personal history of other diseases of the circulatory system: Secondary | ICD-10-CM

## 2022-04-21 DIAGNOSIS — Z0000 Encounter for general adult medical examination without abnormal findings: Secondary | ICD-10-CM

## 2022-04-21 DIAGNOSIS — Z136 Encounter for screening for cardiovascular disorders: Secondary | ICD-10-CM

## 2022-04-21 DIAGNOSIS — I700 Atherosclerosis of aorta: Secondary | ICD-10-CM

## 2022-04-21 DIAGNOSIS — R7309 Other abnormal glucose: Secondary | ICD-10-CM

## 2022-04-21 DIAGNOSIS — I10 Essential (primary) hypertension: Secondary | ICD-10-CM | POA: Diagnosis not present

## 2022-04-21 DIAGNOSIS — Z1211 Encounter for screening for malignant neoplasm of colon: Secondary | ICD-10-CM

## 2022-04-21 DIAGNOSIS — E559 Vitamin D deficiency, unspecified: Secondary | ICD-10-CM

## 2022-04-21 DIAGNOSIS — Z125 Encounter for screening for malignant neoplasm of prostate: Secondary | ICD-10-CM

## 2022-04-21 DIAGNOSIS — K219 Gastro-esophageal reflux disease without esophagitis: Secondary | ICD-10-CM

## 2022-04-21 DIAGNOSIS — E782 Mixed hyperlipidemia: Secondary | ICD-10-CM

## 2022-04-21 DIAGNOSIS — N401 Enlarged prostate with lower urinary tract symptoms: Secondary | ICD-10-CM

## 2022-04-21 DIAGNOSIS — N138 Other obstructive and reflux uropathy: Secondary | ICD-10-CM

## 2022-04-21 DIAGNOSIS — Z79899 Other long term (current) drug therapy: Secondary | ICD-10-CM

## 2022-04-21 MED ORDER — DEXAMETHASONE 4 MG PO TABS: ORAL_TABLET | ORAL | 0 refills | Status: DC

## 2022-04-25 DIAGNOSIS — Z5189 Encounter for other specified aftercare: Secondary | ICD-10-CM | POA: Diagnosis not present

## 2022-04-25 DIAGNOSIS — L821 Other seborrheic keratosis: Secondary | ICD-10-CM | POA: Diagnosis not present

## 2022-06-16 ENCOUNTER — Ambulatory Visit: Payer: PPO | Admitting: Nurse Practitioner

## 2022-06-22 ENCOUNTER — Ambulatory Visit: Payer: PPO | Admitting: Nurse Practitioner

## 2022-07-25 ENCOUNTER — Encounter: Payer: Self-pay | Admitting: Nurse Practitioner

## 2022-07-25 ENCOUNTER — Ambulatory Visit (INDEPENDENT_AMBULATORY_CARE_PROVIDER_SITE_OTHER): Payer: PPO | Admitting: Nurse Practitioner

## 2022-07-25 VITALS — BP 134/80 | HR 89 | Temp 97.9°F | Resp 16 | Ht 71.0 in | Wt 191.4 lb

## 2022-07-25 DIAGNOSIS — I700 Atherosclerosis of aorta: Secondary | ICD-10-CM | POA: Diagnosis not present

## 2022-07-25 DIAGNOSIS — Z0000 Encounter for general adult medical examination without abnormal findings: Secondary | ICD-10-CM

## 2022-07-25 DIAGNOSIS — M5440 Lumbago with sciatica, unspecified side: Secondary | ICD-10-CM | POA: Diagnosis not present

## 2022-07-25 DIAGNOSIS — I10 Essential (primary) hypertension: Secondary | ICD-10-CM

## 2022-07-25 DIAGNOSIS — Z1211 Encounter for screening for malignant neoplasm of colon: Secondary | ICD-10-CM

## 2022-07-25 DIAGNOSIS — R6889 Other general symptoms and signs: Secondary | ICD-10-CM

## 2022-07-25 DIAGNOSIS — R972 Elevated prostate specific antigen [PSA]: Secondary | ICD-10-CM

## 2022-07-25 DIAGNOSIS — E663 Overweight: Secondary | ICD-10-CM | POA: Diagnosis not present

## 2022-07-25 DIAGNOSIS — E559 Vitamin D deficiency, unspecified: Secondary | ICD-10-CM

## 2022-07-25 DIAGNOSIS — E782 Mixed hyperlipidemia: Secondary | ICD-10-CM

## 2022-07-25 DIAGNOSIS — F419 Anxiety disorder, unspecified: Secondary | ICD-10-CM

## 2022-07-25 DIAGNOSIS — I25810 Atherosclerosis of coronary artery bypass graft(s) without angina pectoris: Secondary | ICD-10-CM

## 2022-07-25 DIAGNOSIS — G4700 Insomnia, unspecified: Secondary | ICD-10-CM | POA: Diagnosis not present

## 2022-07-25 DIAGNOSIS — R7309 Other abnormal glucose: Secondary | ICD-10-CM

## 2022-07-25 DIAGNOSIS — K219 Gastro-esophageal reflux disease without esophagitis: Secondary | ICD-10-CM | POA: Diagnosis not present

## 2022-07-25 DIAGNOSIS — Z0001 Encounter for general adult medical examination with abnormal findings: Secondary | ICD-10-CM | POA: Diagnosis not present

## 2022-07-25 DIAGNOSIS — Z79899 Other long term (current) drug therapy: Secondary | ICD-10-CM | POA: Diagnosis not present

## 2022-07-25 DIAGNOSIS — Z86718 Personal history of other venous thrombosis and embolism: Secondary | ICD-10-CM | POA: Diagnosis not present

## 2022-07-25 NOTE — Progress Notes (Signed)
MEDICARE ANNUAL WELLNESS VISIT AND FOLLOW-UP  Assessment:   Encounter for Medicare annual wellness exam Due Annually  Health maintenance reviewed Healthy lifestyle goals set   Essential hypertension Controlled Continue Lisinopril Discussed DASH (Dietary Approaches to Stop Hypertension) DASH diet is lower in sodium than a typical American diet. Cut back on foods that are high in saturated fat, cholesterol, and trans fats. Eat more whole-grain foods, fish, poultry, and nuts Remain active and exercise as tolerated daily.  Monitor BP at home-Call if greater than 130/80.  Check CMP/CBC  Atherosclerosis of coronary artery bypass graft of native heart without angina pectoris Continue Atorvastatin Control blood pressure, cholesterol, glucose, remain active.   Aortic atherosclerosis ([facility]) by Abd CT 09/27/2018 Continue Atorvastatin Control blood pressure, cholesterol, glucose, remain active.   Gastroesophageal reflux disease without esophagitis Colonoscopy UTD 2019 - Recall 5 years Due 2024 Continue Esomeprazole No suspected reflux complications (Barret/stricture). Lifestyle modification:  wt loss, avoid meals 2-3h before bedtime. Consider eliminating food triggers:  chocolate, caffeine, EtOH, acid/spicy food.  Hyperlipidemia, mixed Continue Atorvastatin ASA Discussed lifestyle modifications. Recommended diet heavy in fruits and veggies, omega 3's. Decrease consumption of animal meats, cheeses, and dairy products. Remain active and exercise as tolerated. Continue to monitor. Check lipids/TSH  DVT, HX OF Monitor  Abnormal glucose Education: Reviewed 'ABCs' of diabetes management  Discussed goals to be met and/or maintained include A1C (<7) Blood pressure (<130/80) Cholesterol (LDL <70) Continue Eye Exam yearly  Continue Dental Exam Q6 mo Discussed dietary recommendations Discussed Physical Activity recommendations Foot exam UTD Check A1C  Vitamin D deficiency Continue  supplement for goal of 60-100 Monitor Vitamin D levels  Elevated PSA WNL 03/2022 at 0.80 Continue Finasteride 5 mg Monitor levels during CPE  Overweight (BMI 25.0-29.9) Discussed appropriate BMI Diet modification. Physical activity. Encouraged/praised to build confidence.  Hx of adenomatous polyp of colon Normal 2019 Colonoscopy due 2024  Insomnia, unspecified type Will not refill Xanax. Failed Hydroxyzine Will start Buspar 5 mg HS   Chronic Anxiety Will not refill Xanax. Continue Lexapro 10 mg Start Buspar 5 mg HS.    Acute bilateral low back pain, with sciatica. Start Meloxicam 7.5 mg Discussed taking with food.   Currently on PPI. Stretching, alternate ice/heat. Donut seat.  Medication management Medication discussed in detail.  All questions and concerns addressed  Orders Placed This Encounter  Procedures   CBC with Differential/Platelet   COMPLETE METABOLIC PANEL WITH GFR   Lipid panel   Hemoglobin A1c   VITAMIN D 25 Hydroxy (Vit-D Deficiency, Fractures)   Ambulatory referral to Gastroenterology    Referral Priority:   Routine    Referral Type:   Consultation    Referral Reason:   Specialty Services Required    Number of Visits Requested:   1    Notify office for further evaluation and treatment, questions or concerns if any reported s/s fail to improve.   The patient was advised to call back or seek an in-person evaluation if any symptoms worsen or if the condition fails to improve as anticipated.   Further disposition pending results of labs. Discussed med's effects and SE's.    I discussed the assessment and treatment plan with the patient. The patient was provided an opportunity to ask questions and all were answered. The patient agreed with the plan and demonstrated an understanding of the instructions.  Discussed med's effects and SE's. Screening labs and tests as requested with regular follow-up as recommended.  I provided 35 minutes of  face-to-face time  during this encounter including counseling, chart review, and critical decision making was preformed.  Today's Plan of Care is based on a patient-centered health care approach known as shared decision making - the decisions, tests and treatments allow for patient preferences and values to be balanced with clinical evidence.     Future Appointments  Date Time Provider Department Center  11/02/2022  9:30 AM Adela Glimpse, NP [facility] None  04/26/2023 11:00 AM Lucky Cowboy, MD [facility] None  07/25/2023  9:30 AM Adela Glimpse, NP [facility] None     Plan:   During the course of the visit the patient was educated and counseled about appropriate screening and preventive services including:   Pneumococcal vaccine Prevnar 13  Influenza vaccine Td vaccine Screening electrocardiogram Bone densitometry screening Colorectal cancer screening Diabetes screening Glaucoma screening Nutrition counseling  Advanced directives: requested   Subjective:  [name] is a 67 y.o. male who presents for Medicare Annual Wellness Visit and 3 month follow up.   Overall he reports feeling well.  He has no new concerns in clinic today.  He retired at the end of last year and is enjoying retirement however, he continues to work part-time with IT.  Has insomnia, was prescribed xanax 0.5 mg in the past and no longer taking.  Has tried Hydroxyzine in the past, but makes him too sleepy.  Gabapentin is not effective.  He is using Advil PM PRN with some effectiveness.    Endorses increase in left sided sciatica pain PRN, most notably when driving   He does not sit or ride with wallet in the back pocket.  Has purchased a donut seat which helps.   BMI is Body mass index is 26.69 kg/m., he has been working on diet, not intentionally exercising but very active all day, around home and garage/yard.  Wt Readings from Last 3 Encounters:  07/25/22 191 lb 6.4 oz (86.8 kg)  04/21/22 194  lb 3.2 oz (88.1 kg)  12/20/21 193 lb (87.5 kg)   2008 s/p PCA/Stents with Dr. Tenny Craw. Negative Cardiolite 2015. He has aortic atherosclerosis per CT 2020.   His blood pressure has been controlled at home, today their BP is BP: 134/80   He does not workout, does yard work. He denies chest pain, shortness of breath, dizziness.   He is on cholesterol medication (atoravstatin 80 mg daily) and denies myalgias. His cholesterol is at goal. Will forget to take occ.  The cholesterol last visit was:   Lab Results  Component Value Date   CHOL 125 03/23/2022   HDL 44 03/23/2022   LDLCALC 64 03/23/2022   LDLDIRECT 190.6 07/30/2010   TRIG 90 03/23/2022   CHOLHDL 2.8 03/23/2022    He has been working on diet and exercise for prediabetes, and denies polydipsia, polyuria and visual disturbances. Last A1C in the office was:  Lab Results  Component Value Date   HGBA1C 5.9 (H) 03/23/2022   He does push tea/water. Last GFR:  Lab Results  Component Value Date   GFRNONAA >60 09/11/2021   GFRNONAA 70 05/20/2020   GFRNONAA 50 (L) 02/17/2020   Patient is on Vitamin D supplement.   Lab Results  Component Value Date   VD25OH 65 03/23/2022     Hx of elevated PSA, now on finasteride with improved recently.  Lab Results  Component Value Date   PSA 0.80 03/23/2022   PSA 0.36 03/04/2021   PSA 0.68 02/17/2020     Medication Review:  Current Outpatient Medications (Endocrine &  Metabolic):    dexamethasone (DECADRON) 4 MG tablet, Take 1 tab 3 x day - 3 days, then 2 x day - 3 days, then 1 tab daily (Patient not taking: Reported on 07/25/2022)  Current Outpatient Medications (Cardiovascular):    atorvastatin (LIPITOR) 80 MG tablet, TAKE 1 TABLET BY MOUTH EVERY DAY FOR CHOLESTEROL   lisinopril (ZESTRIL) 10 MG tablet, Take 1 tablet Daily for BP                                                 /                                                          TAKE                                BY                              MOUTH                       ONCE DAILY   sildenafil (REVATIO) 20 MG tablet, TAKE 1 TO 5 TABS BY MOUTH DAILY AS NEEDED.   Current Outpatient Medications (Analgesics):    aspirin EC 81 MG tablet, 81 mg daily.    meloxicam (MOBIC) 7.5 MG tablet, Take  1 tablet  Daily  with Food for Pain & Inflammation                                                    /                                                             Take                        by                                mouth (Patient not taking: Reported on 07/25/2022)   Current Outpatient Medications (Other):    Cholecalciferol (VITAMIN D) 125 MCG (5000 UT) CAPS, Take 1 capsule by mouth daily. 10,000 units   esomeprazole (NEXIUM) 20 MG capsule, Take 20 mg by mouth daily at 12 noon. Takes OTC 20 mg   finasteride (PROSCAR) 5 MG tablet, TAKE 1 TABLE TDAILY FOR PROSTATE   hydrOXYzine (ATARAX) 25 MG tablet, Take  1 to 2 tablets  1 hour  before Bedtime  as needed for Sleep   MAGNESIUM PO, Take by mouth.  Allergies: Allergies  Allergen Reactions   Penicillins     unspecified    Current  Problems (verified) Patient Active Problem List   Diagnosis Date Noted   Aortic atherosclerosis (HCC) by Abd CT 09/27/2018 03/27/2019   Insomnia 02/21/2019   Overweight (BMI 25.0-29.9) 03/29/2017   Elevated PSA 09/16/2015   Abnormal glucose 07/24/2013   Vitamin D deficiency 07/24/2013   Medication management 07/24/2013   Hx of adenomatous polyp of colon 06/09/2010   Essential hypertension 08/05/2008   Hyperlipidemia, mixed 08/02/2006   ASCAD s/p PTCA/Stent (2005)  08/02/2006   Gastroesophageal reflux disease 08/02/2006   DVT, HX OF 08/02/2006    Screening Tests Immunization History  Administered Date(s) Administered   Influenza, High Dose Seasonal PF 12/22/2020   Influenza,inj,Quad PF,6+ Mos 12/07/2016, 11/21/2018   Influenza-Unspecified 01/28/2018   Moderna Sars-Covid-2 Vaccination 03/01/2019, 04/05/2019, 01/27/2020   PPD Test  07/24/2013, 07/30/2014, 09/16/2016, 10/23/2017   Tdap 07/30/2010   Preventative care: Last colonoscopy: Last Colon - 08/09/2017 - Dr Matthias Hughs recc 5 yr f/u - July 2024 - referral placed  Prior vaccinations: TD or Tdap: 2012 will get PRN  Influenza: currently n/a Pneumococcal: Declines Shingles: declines Covid 19: 3/3, 2021, moderna  Last eye: [facility], last 2023, new glasses Last dental: Dr. Leticia Penna, last 2023  Patient Care Team: Lucky Cowboy, MD as PCP - General (Internal Medicine)  Surgical: Past Surgical History:  Procedure Laterality Date   COLONOSCOPY     COLONOSCOPY W/ POLYPECTOMY  06/16/2010   diminutive adenoma, diverticulosis, hemorrhoids   CORONARY ANGIOPLASTY WITH STENT PLACEMENT     ESOPHAGOGASTRODUODENOSCOPY  06/16/2010   antral ulcers, esophageal stricture - dilated, duodenitis, hiatal hernia   HERNIA REPAIR Right    INGUINAL HERNIA REPAIR Right 11/05/2018   Procedure: OPEN RIGHT INGUINAL HERNIA REPAIR WITH MESH;  Surgeon: Abigail Miyamoto, MD;  Location: [facility];  Service: General;  Laterality: Right;  TAP BLOCK   PILONIDAL CYST EXCISION     teenager   POLYPECTOMY     UPPER GASTROINTESTINAL ENDOSCOPY     Family His family history includes Colon cancer in his father; Dementia in his mother. Social history  He reports that he has never smoked. He has been exposed to tobacco smoke. He has never used smokeless tobacco. He reports that he does not currently use alcohol. He reports that he does not use drugs.  MEDICARE WELLNESS OBJECTIVES: Physical activity: Current Exercise Habits: Home exercise routine Cardiac risk factors:   Depression/mood screen:      07/25/2022    9:33 AM  Depression screen PHQ 2/9  Decreased Interest 0  Down, Depressed, Hopeless 0  PHQ - 2 Score 0    ADLs:     07/25/2022    9:33 AM 09/15/2021    1:37 PM  In your present state of health, do you have any difficulty performing the following activities:  Hearing?  0 0  Vision? 0 0  Difficulty concentrating or making decisions? 0 0  Walking or climbing stairs? 0 0  Dressing or bathing? 0 0  Doing errands, shopping? 0 0     Cognitive Testing  Alert? Yes  Normal Appearance?Yes  Oriented to person? Yes  Place? Yes   Time? Yes  Recall of three objects?  Yes  Can perform simple calculations? Yes  Displays appropriate judgment?Yes  Can read the correct time from a watch face?Yes  EOL planning: Does Patient Have a Medical Advance Directive?: Yes Type of Advance Directive: Living will  Review of Systems  Constitutional:  Negative for malaise/fatigue and weight loss.  HENT:  Negative for hearing loss and tinnitus.  Eyes:  Negative for blurred vision and double vision.  Respiratory:  Negative for cough, sputum production, shortness of breath and wheezing.   Cardiovascular:  Negative for chest pain, palpitations, orthopnea, claudication, leg swelling and PND.  Gastrointestinal:  Negative for abdominal pain, blood in stool, constipation, diarrhea, heartburn, melena, nausea and vomiting.  Genitourinary: Negative.   Musculoskeletal:  Negative for falls, joint pain and myalgias.  Skin:  Negative for rash.  Neurological:  Negative for dizziness, tingling, sensory change, weakness and headaches.  Endo/Heme/Allergies:  Negative for polydipsia.  Psychiatric/Behavioral: Negative.  Negative for depression, memory loss, substance abuse and suicidal ideas. The patient is not nervous/anxious and does not have insomnia.   All other systems reviewed and are negative.    Objective:     Today's Vitals   07/25/22 0849  BP: 134/80  Pulse: 89  Resp: 16  Temp: 97.9 F (36.6 C)  SpO2: 99%  Weight: 191 lb 6.4 oz (86.8 kg)  Height: 5\' 11"  (1.803 m)   Body mass index is 26.69 kg/m.  General appearance: alert, no distress, WD/WN, male HEENT: normocephalic, sclerae anicteric, TMs pearly, nares patent, no discharge or erythema, pharynx normal Oral cavity: MMM,  no lesions Neck: supple, no lymphadenopathy, no thyromegaly, no masses Heart: RRR, normal S1, S2, no murmurs Lungs: CTA bilaterally, no wheezes, rhonchi, or rales Abdomen: +bs, soft, non tender, non distended, no masses, no hepatomegaly, no splenomegaly Musculoskeletal: nontender, no swelling, no obvious deformity Extremities: no edema, no cyanosis, no clubbing Pulses: 2+ symmetric, upper and lower extremities, normal cap refill Neurological: alert, oriented x 3, CN2-12 intact, strength normal upper extremities and lower extremities, sensation normal throughout, DTRs 2+ throughout, no cerebellar signs, gait Normal Psychiatric: normal affect, behavior normal, pleasant   Medicare Attestation I have personally reviewed: The patient's medical and social history Their use of alcohol, tobacco or illicit drugs Their current medications and supplements The patient's functional ability including ADLs,fall risks, home safety risks, cognitive, and hearing and visual impairment Diet and physical activities Evidence for depression or mood disorders  The patient's weight, height, BMI, and visual acuity have been recorded in the chart.  I have made referrals, counseling, and provided education to the patient based on review of the above and I have provided the patient with a written personalized care plan for preventive services.     Adela Glimpse, NP   07/25/2022

## 2022-07-25 NOTE — Patient Instructions (Signed)

## 2022-07-26 LAB — HEMOGLOBIN A1C
Hgb A1c MFr Bld: 6.1 % of total Hgb — ABNORMAL HIGH (ref <5.7)
Mean Plasma Glucose: 128 mg/dL
eAG (mmol/L): 7.1 mmol/L

## 2022-07-26 LAB — CBC WITH DIFFERENTIAL/PLATELET
Absolute Monocytes: 662 cells/uL (ref 200–950)
Basophils Absolute: 62 cells/uL (ref 0–200)
Basophils Relative: 0.9 %
Eosinophils Absolute: 290 cells/uL (ref 15–500)
Eosinophils Relative: 4.2 %
HCT: 41.1 % (ref 38.5–50.0)
Hemoglobin: 13.8 g/dL (ref 13.2–17.1)
Lymphs Abs: 1587 cells/uL (ref 850–3900)
MCH: 30.6 pg (ref 27.0–33.0)
MCHC: 33.6 g/dL (ref 32.0–36.0)
MCV: 91.1 fL (ref 80.0–100.0)
MPV: 12.1 fL (ref 7.5–12.5)
Monocytes Relative: 9.6 %
Neutro Abs: 4299 cells/uL (ref 1500–7800)
Neutrophils Relative %: 62.3 %
Platelets: 271 10*3/uL (ref 140–400)
RBC: 4.51 10*6/uL (ref 4.20–5.80)
RDW: 13.1 % (ref 11.0–15.0)
Total Lymphocyte: 23 %
WBC: 6.9 10*3/uL (ref 3.8–10.8)

## 2022-07-26 LAB — COMPLETE METABOLIC PANEL WITH GFR
AG Ratio: 2.3 (calc) (ref 1.0–2.5)
ALT: 15 U/L (ref 9–46)
AST: 12 U/L (ref 10–35)
Albumin: 4.3 g/dL (ref 3.6–5.1)
Alkaline phosphatase (APISO): 58 U/L (ref 35–144)
BUN: 20 mg/dL (ref 7–25)
CO2: 29 mmol/L (ref 20–32)
Calcium: 9.8 mg/dL (ref 8.6–10.3)
Chloride: 107 mmol/L (ref 98–110)
Creat: 1.19 mg/dL (ref 0.70–1.35)
Globulin: 1.9 g/dL (calc) (ref 1.9–3.7)
Glucose, Bld: 94 mg/dL (ref 65–99)
Potassium: 5 mmol/L (ref 3.5–5.3)
Sodium: 141 mmol/L (ref 135–146)
Total Bilirubin: 0.6 mg/dL (ref 0.2–1.2)
Total Protein: 6.2 g/dL (ref 6.1–8.1)
eGFR: 67 mL/min/{1.73_m2} (ref >=60)

## 2022-07-26 LAB — LIPID PANEL
Cholesterol: 145 mg/dL (ref <200)
HDL: 51 mg/dL (ref >=40)
LDL Cholesterol (Calc): 71 mg/dL (calc)
Non-HDL Cholesterol (Calc): 94 mg/dL (calc) (ref <130)
Total CHOL/HDL Ratio: 2.8 (calc) (ref <5.0)
Triglycerides: 155 mg/dL — ABNORMAL HIGH (ref <150)

## 2022-07-26 LAB — VITAMIN D 25 HYDROXY (VIT D DEFICIENCY, FRACTURES): Vit D, 25-Hydroxy: 69 ng/mL (ref 30–100)

## 2022-08-08 ENCOUNTER — Other Ambulatory Visit: Payer: Self-pay

## 2022-08-08 DIAGNOSIS — E782 Mixed hyperlipidemia: Secondary | ICD-10-CM

## 2022-08-08 MED ORDER — ATORVASTATIN CALCIUM 80 MG PO TABS: ORAL_TABLET | ORAL | 3 refills | Status: DC

## 2022-08-26 ENCOUNTER — Encounter: Payer: Self-pay | Admitting: Internal Medicine

## 2022-09-20 ENCOUNTER — Encounter: Payer: Self-pay | Admitting: Internal Medicine

## 2022-09-20 ENCOUNTER — Ambulatory Visit (AMBULATORY_SURGERY_CENTER): Payer: PPO

## 2022-09-20 VITALS — Ht 71.0 in | Wt 190.0 lb

## 2022-09-20 DIAGNOSIS — Z86010 Personal history of colonic polyps: Secondary | ICD-10-CM

## 2022-09-20 NOTE — Progress Notes (Signed)
No egg or soy allergy known to patient   No issues known to pt with past sedation with any surgeries or procedures  Patient denies ever being told they had issues or difficulty with intubation   No FH of Malignant Hyperthermia  Pt is not on diet pills  Pt is not on  home 02   Pt is not on blood thinners   Pt denies issues with constipation   No A fib or A flutter  Have any cardiac testing pending--no  miralax prep  Patient's chart reviewed by Cathlyn Parsons CRNA prior to previsit and patient appropriate for the [facility].  Previsit completed and red dot placed by patient's name on their procedure day (on provider's schedule).

## 2022-10-06 NOTE — Progress Notes (Signed)
[facility] History and Physical   Primary Care Physician:  Lucky Cowboy, MD   Reason for Procedure:   Hx colon polyps  Plan:    colonoscopy     HPI: [name] is a 67 y.o. male s/p removal diminutive adenoma 2012 and 3 adenomas 2019   Past Medical History:  Diagnosis Date   ANXIETY 12/25/2007   ANXIETY STATE NEC 11/01/2006   CORONARY ARTERY DISEASE 08/02/2006   off Plavix x 3 years   DIVERTICULITIS OF COLON 01/06/2010   DVT, HX OF 08/02/2006   GERD with stricture 08/02/2006   History of thyroiditis 12/15/2018   Hx of adenomatous polyp of colon 06/09/2010   HYPERLIPIDEMIA 08/02/2006   HYPERTENSION 08/05/2008   Hypothyroidism 03/19/2014   Memory loss 07/21/2009   Pilonidal cyst    low back pain   Prostatitis 09/16/2015   Recurrent inguinal hernia    right    Past Surgical History:  Procedure Laterality Date   COLONOSCOPY     COLONOSCOPY W/ POLYPECTOMY  06/16/2010   diminutive adenoma, diverticulosis, hemorrhoids   CORONARY ANGIOPLASTY WITH STENT PLACEMENT  2005   ESOPHAGOGASTRODUODENOSCOPY  06/16/2010   antral ulcers, esophageal stricture - dilated, duodenitis, hiatal hernia   HERNIA REPAIR Right    INGUINAL HERNIA REPAIR Right 11/05/2018   Procedure: OPEN RIGHT INGUINAL HERNIA REPAIR WITH MESH;  Surgeon: Abigail Miyamoto, MD;  Location: [facility];  Service: General;  Laterality: Right;  TAP BLOCK   PILONIDAL CYST EXCISION     teenager   POLYPECTOMY     UPPER GASTROINTESTINAL ENDOSCOPY      Prior to Admission medications   Medication Sig Start Date End Date Taking? Authorizing Provider  aspirin EC 81 MG tablet 81 mg daily.     [provider]  atorvastatin (LIPITOR) 80 MG tablet TAKE 1 TABLET BY MOUTH EVERY DAY FOR CHOLESTEROL 08/08/22   Adela Glimpse, NP  Cholecalciferol (VITAMIN D) 125 MCG (5000 UT) CAPS Take 1 capsule by mouth daily. 10,000 units    [provider]  dexamethasone (DECADRON) 4 MG tablet Take 1  tab 3 x day - 3 days, then 2 x day - 3 days, then 1 tab daily Patient not taking: Reported on 07/25/2022 04/21/22   Lucky Cowboy, MD  esomeprazole (NEXIUM) 20 MG capsule Take 20 mg by mouth daily at 12 noon. Takes OTC 20 mg    [provider]  finasteride (PROSCAR) 5 MG tablet TAKE 1 TABLE TDAILY FOR PROSTATE 12/23/21   Raynelle Dick, NP  hydrOXYzine (ATARAX) 25 MG tablet Take  1 to 2 tablets  1 hour  before Bedtime  as needed for Sleep 03/05/21   Lucky Cowboy, MD  lisinopril (ZESTRIL) 10 MG tablet Take 1 tablet Daily for BP                                                 /                                                          TAKE  BY                             MOUTH                       ONCE DAILY 10/03/21   Lucky Cowboy, MD  MAGNESIUM PO Take by mouth.    [provider]  meloxicam (MOBIC) 7.5 MG tablet Take  1 tablet  Daily  with Food for Pain & Inflammation                                                    /                                                             Take                        by                                mouth 01/10/22   Lucky Cowboy, MD  sildenafil (REVATIO) 20 MG tablet TAKE 1 TO 5 TABS BY MOUTH DAILY AS NEEDED. 10/14/16   Lucky Cowboy, MD    Current Outpatient Medications  Medication Sig Dispense Refill   aspirin EC 81 MG tablet 81 mg daily.      atorvastatin (LIPITOR) 80 MG tablet TAKE 1 TABLET BY MOUTH EVERY DAY FOR CHOLESTEROL 90 tablet 3   Cholecalciferol (VITAMIN D) 125 MCG (5000 UT) CAPS Take 1 capsule by mouth daily. 10,000 units     esomeprazole (NEXIUM) 20 MG capsule Take 20 mg by mouth daily at 12 noon. Takes OTC 20 mg     finasteride (PROSCAR) 5 MG tablet TAKE 1 TABLE TDAILY FOR PROSTATE (Patient taking differently: Take .5 table tDaily for Prostate) 90 tablet 2   hydrOXYzine (ATARAX) 25 MG tablet Take  1 to 2 tablets  1 hour  before Bedtime  as needed for Sleep 180 tablet 3    lisinopril (ZESTRIL) 10 MG tablet Take 1 tablet Daily for BP                                                 /                                                          TAKE                                BY  MOUTH                       ONCE DAILY 90 tablet 3   MAGNESIUM PO Take by mouth.     dexamethasone (DECADRON) 4 MG tablet Take 1 tab 3 x day - 3 days, then 2 x day - 3 days, then 1 tab daily (Patient not taking: Reported on 07/25/2022) 20 tablet 0   meloxicam (MOBIC) 7.5 MG tablet Take  1 tablet  Daily  with Food for Pain & Inflammation                                                    /                                                             Take                        by                                mouth 90 tablet 3   sildenafil (REVATIO) 20 MG tablet TAKE 1 TO 5 TABS BY MOUTH DAILY AS NEEDED. 90 tablet 11   Current Facility-Administered Medications  Medication Dose Route Frequency Provider Last Rate Last Admin   0.9 %  sodium chloride infusion  500 mL Intravenous Continuous Iva Boop, MD        Allergies as of 10/07/2022 - Review Complete 10/07/2022  Allergen Reaction Noted   Penicillins  07/31/2006    Family History  Problem Relation Age of Onset   Dementia Mother    Colon polyps Father    Colon cancer Father        deceased age 64   Breast cancer Neg Hx    Esophageal cancer Neg Hx    Liver cancer Neg Hx    Pancreatic cancer Neg Hx    Rectal cancer Neg Hx    Stomach cancer Neg Hx     Social History   Socioeconomic History   Marital status: Married    Spouse name: Not on file   Number of children: 2   Years of education: Not on file   Highest education level: Not on file  Occupational History   Occupation: Production manager: [employer]   Tobacco Use   Smoking status: Never    Passive exposure: Past   Smokeless tobacco: Never  Vaping Use   Vaping status: Never Used  Substance and Sexual Activity    Alcohol use: Yes    Comment: approx 2 drinks per week   Drug use: Never   Sexual activity: Yes  Other Topics Concern   Not on file  Social History Narrative   Daily caffeine   Social Determinants of Health   Financial Resource Strain: Not on file  Food Insecurity: Not on file  Transportation Needs: Not on file  Physical Activity: Not on file  Stress: Not on file  Social Connections: Not on file  Intimate Partner  Violence: Not on file    Review of Systems:  All other review of systems negative except as mentioned in the HPI.  Physical Exam: Vital signs BP (!) 111/56   Pulse 67   Temp 98.9 F (37.2 C) (Skin)   Ht 5\' 11"  (1.803 m)   Wt 190 lb (86.2 kg)   SpO2 97%   BMI 26.50 kg/m   General:   Alert,  Well-developed, well-nourished, pleasant and cooperative in NAD Lungs:  Clear throughout to auscultation.   Heart:  Regular rate and rhythm; no murmurs, clicks, rubs,  or gallops. Abdomen:  Soft, nontender and nondistended. Normal bowel sounds.   Neuro/Psych:  Alert and cooperative. Normal mood and affect. A and O x 3   @Eusebio Blazejewski  Sena Slate, MD, [facility] [phone] (pager) 10/07/2022 2:42 PM@

## 2022-10-07 ENCOUNTER — Encounter: Payer: Self-pay | Admitting: Internal Medicine

## 2022-10-07 ENCOUNTER — Ambulatory Visit (AMBULATORY_SURGERY_CENTER): Payer: PPO | Admitting: Internal Medicine

## 2022-10-07 VITALS — BP 122/63 | HR 68 | Temp 98.9°F | Resp 12 | Ht 71.0 in | Wt 190.0 lb

## 2022-10-07 DIAGNOSIS — Z86010 Personal history of colonic polyps: Secondary | ICD-10-CM

## 2022-10-07 DIAGNOSIS — I10 Essential (primary) hypertension: Secondary | ICD-10-CM | POA: Diagnosis not present

## 2022-10-07 DIAGNOSIS — E039 Hypothyroidism, unspecified: Secondary | ICD-10-CM | POA: Diagnosis not present

## 2022-10-07 DIAGNOSIS — F419 Anxiety disorder, unspecified: Secondary | ICD-10-CM | POA: Diagnosis not present

## 2022-10-07 DIAGNOSIS — Z09 Encounter for follow-up examination after completed treatment for conditions other than malignant neoplasm: Secondary | ICD-10-CM | POA: Diagnosis not present

## 2022-10-07 DIAGNOSIS — D123 Benign neoplasm of transverse colon: Secondary | ICD-10-CM

## 2022-10-07 DIAGNOSIS — K635 Polyp of colon: Secondary | ICD-10-CM | POA: Diagnosis not present

## 2022-10-07 DIAGNOSIS — I2510 Atherosclerotic heart disease of native coronary artery without angina pectoris: Secondary | ICD-10-CM | POA: Diagnosis not present

## 2022-10-07 DIAGNOSIS — E785 Hyperlipidemia, unspecified: Secondary | ICD-10-CM | POA: Diagnosis not present

## 2022-10-07 MED ORDER — SODIUM CHLORIDE 0.9 % IV SOLN: 500.0000 mL | INTRAVENOUS | Status: DC

## 2022-10-07 NOTE — Op Note (Signed)
[facility] Patient Name: [name] Procedure Date: 10/07/2022 2:41 PM MRN: [id number] Endoscopist: Iva Boop , MD, [id number] Age: 67 Referring MD:  Date of Birth: 1955-12-03 Gender: Male Account #: [id number] Procedure:                Colonoscopy Indications:              Surveillance: Personal history of adenomatous                            polyps on last colonoscopy 5 years ago, Last                            colonoscopy: 2019 Medicines:                Monitored Anesthesia Care Procedure:                Pre-Anesthesia Assessment:                           - Prior to the procedure, a History and Physical                            was performed, and patient medications and                            allergies were reviewed. The patient's tolerance of                            previous anesthesia was also reviewed. The risks                            and benefits of the procedure and the sedation                            options and risks were discussed with the patient.                            All questions were answered, and informed consent                            was obtained. Prior Anticoagulants: The patient has                            taken no anticoagulant or antiplatelet agents. ASA                            Grade Assessment: II - A patient with mild systemic                            disease. After reviewing the risks and benefits,                            the patient was deemed in satisfactory condition to  undergo the procedure.                           After obtaining informed consent, the colonoscope                            was passed under direct vision. Throughout the                            procedure, the patient's blood pressure, pulse, and                            oxygen saturations were monitored continuously. The                            CF [id number] #[id number] was introduced through the  anus                            and advanced to the the cecum, identified by                            appendiceal orifice and ileocecal valve. The                            colonoscopy was performed without difficulty. The                            patient tolerated the procedure well. The quality                            of the bowel preparation was good. The ileocecal                            valve, appendiceal orifice, and rectum were                            photographed. The bowel preparation used was                            Miralax via split dose instruction. Scope In: 2:49:57 PM Scope Out: 3:06:38 PM Scope Withdrawal Time: 0 hours 13 minutes 3 seconds  Total Procedure Duration: 0 hours 16 minutes 41 seconds  Findings:                 The perianal and digital rectal examinations were                            normal. Pertinent negatives include normal prostate                            (size, shape, and consistency).                           A diminutive polyp was found in the transverse  colon. The polyp was sessile. The polyp was removed                            with a cold snare. Resection and retrieval were                            complete. Verification of patient identification                            for the specimen was done. Estimated blood loss was                            minimal.                           Many diverticula were found in the sigmoid colon                            and descending colon.                           The exam was otherwise without abnormality on                            direct and retroflexion views. Complications:            No immediate complications. Estimated Blood Loss:     Estimated blood loss was minimal. Impression:               - One diminutive polyp in the transverse colon,                            removed with a cold snare. Resected and retrieved.                           -  Diverticulosis in the sigmoid colon and in the                            descending colon. Some associated sigmoid stenosis.                           - The examination was otherwise normal on direct                            and retroflexion views.                           - Personal history of colonic polyps. diminutive                            adenoma 2012 and 3 adenomas 2019 Recommendation:           - Patient has a contact number available for                            emergencies. The  signs and symptoms of potential                            delayed complications were discussed with the                            patient. Return to normal activities tomorrow.                            Written discharge instructions were provided to the                            patient.                           - Resume previous diet.                           - Continue present medications.                           - Await pathology results.                           - Repeat colonoscopy is recommended. The                            colonoscopy date will be determined after pathology                            results from today's exam become available for                            review. Iva Boop, MD 10/07/2022 3:16:49 PM This report has been signed electronically.

## 2022-10-07 NOTE — Progress Notes (Signed)
Patient states there have been no changes to medical or surgical history since time of pre-visit. 

## 2022-10-07 NOTE — Patient Instructions (Addendum)
There was one tiny polyp removed.  You still have diverticulosis - thickened muscle rings and pouches in the colon wall. Please read the handout about this condition.  I will let you know pathology results and when to have another routine colonoscopy by mail and/or My Chart.  I appreciate the opportunity to care for you. Iva Boop, MD, [facility]  Recommendation:           - Patient has a contact number available for                            emergencies. The signs and symptoms of potential                            delayed complications were discussed with the                            patient. Return to normal activities tomorrow.                            Written discharge instructions were provided to the                            patient.                           - Resume previous diet.                           - Continue present medications.                           - Await pathology results.                           - Repeat colonoscopy is recommended. The                            colonoscopy date will be determined after pathology                            results from today's exam become available for                            review.  YOU HAD AN ENDOSCOPIC PROCEDURE TODAY AT THE [facility]:   Refer to the procedure report that was given to you for any specific questions about what was found during the examination.  If the procedure report does not answer your questions, please call your gastroenterologist to clarify.  If you requested that your care partner not be given the details of your procedure findings, then the procedure report has been included in a sealed envelope for you to review at your convenience later.  YOU SHOULD EXPECT: Some feelings of bloating in the abdomen. Passage of more gas than usual.  Walking can help get rid of the air that was put into your GI tract during the procedure and reduce the bloating. If you had a lower endoscopy  (such as a colonoscopy  or flexible sigmoidoscopy) you may notice spotting of blood in your stool or on the toilet paper. If you underwent a bowel prep for your procedure, you may not have a normal bowel movement for a few days.  Please Note:  You might notice some irritation and congestion in your nose or some drainage.  This is from the oxygen used during your procedure.  There is no need for concern and it should clear up in a day or so.  SYMPTOMS TO REPORT IMMEDIATELY:  Following lower endoscopy (colonoscopy or flexible sigmoidoscopy):  Excessive amounts of blood in the stool  Significant tenderness or worsening of abdominal pains  Swelling of the abdomen that is new, acute  Fever of 100F or higher   For urgent or emergent issues, a gastroenterologist can be reached at any hour by calling [phone]. Do not use MyChart messaging for urgent concerns.    DIET:  We do recommend a small meal at first, but then you may proceed to your regular diet.  Drink plenty of fluids but you should avoid alcoholic beverages for 24 hours.  ACTIVITY:  You should plan to take it easy for the rest of today and you should NOT DRIVE or use heavy machinery until tomorrow (because of the sedation medicines used during the test).    FOLLOW UP: Our staff will call the number listed on your records the next business day following your procedure.  We will call around 7:15- 8:00 am to check on you and address any questions or concerns that you may have regarding the information given to you following your procedure. If we do not reach you, we will leave a message.     If any biopsies were taken you will be contacted by phone or by letter within the next 1-3 weeks.  Please call us at [phone] if you have not heard about the biopsies in 3 weeks.    SIGNATURES/CONFIDENTIALITY: You and/or your care partner have signed paperwork which will be entered into your electronic medical record.  These signatures  attest to the fact that that the information above on your After Visit Summary has been reviewed and is understood.  Full responsibility of the confidentiality of this discharge information lies with you and/or your care-partner.

## 2022-10-07 NOTE — Progress Notes (Signed)
Called to room to assist during endoscopic procedure.  Patient ID and intended procedure confirmed with present staff. Received instructions for my participation in the procedure from the performing physician.  

## 2022-10-07 NOTE — Progress Notes (Signed)
Patient did dry cough during procedure. Oropharynx suctioned by CRNA with nothing removed. Zofran given prophylactically. Spo2 maintained in high 90s.   Uneventful anesthetic. Report to pacu rn. Vss. Care resumed by rn.

## 2022-10-11 ENCOUNTER — Telehealth: Payer: Self-pay | Admitting: *Deleted

## 2022-10-11 NOTE — Telephone Encounter (Signed)
No answer on  follow up call. Left message.   

## 2022-10-13 ENCOUNTER — Encounter: Payer: Self-pay | Admitting: Internal Medicine

## 2022-10-13 ENCOUNTER — Other Ambulatory Visit: Payer: Self-pay | Admitting: Internal Medicine

## 2022-10-17 ENCOUNTER — Telehealth: Payer: Self-pay | Admitting: Internal Medicine

## 2022-10-17 NOTE — Telephone Encounter (Signed)
Patient calling to f/u on previous note. Please advise. 

## 2022-10-17 NOTE — Telephone Encounter (Signed)
Pt stated that he started having lower back pain and abdominal pain ( soreness) starting September 5th. Pt stated that its hard to bend over and feels it when he turns a certain way, Feels better when sitting. Pt stated that it is starting to get better over all but wanted to ensure that it is not a complication from his colonoscopy. Pt colonoscopy was done on 10/07/2022. Last BM this AM was normal. Pt was notified that it seems like it maybe muscle skeletal in nature or it could be his kidneys. Pt was encouraged to reach out to his PCP.  Pt verbalized understanding with all questions answered.

## 2022-10-17 NOTE — Telephone Encounter (Signed)
Inbound call from patient stating he has been experiencing severe abdominal pain. Patient requesting a call back to discuss if it is normal to feel pain after having his colonoscopy on 8/30. Please advise, thank you.

## 2022-10-24 ENCOUNTER — Ambulatory Visit (INDEPENDENT_AMBULATORY_CARE_PROVIDER_SITE_OTHER): Payer: PPO | Admitting: Internal Medicine

## 2022-10-24 ENCOUNTER — Encounter: Payer: Self-pay | Admitting: Internal Medicine

## 2022-10-24 VITALS — BP 160/90 | HR 95 | Temp 97.9°F | Resp 16 | Ht 71.0 in | Wt 188.0 lb

## 2022-10-24 DIAGNOSIS — M545 Low back pain, unspecified: Secondary | ICD-10-CM

## 2022-10-24 DIAGNOSIS — F5101 Primary insomnia: Secondary | ICD-10-CM

## 2022-10-24 DIAGNOSIS — R1030 Lower abdominal pain, unspecified: Secondary | ICD-10-CM | POA: Diagnosis not present

## 2022-10-24 DIAGNOSIS — R1084 Generalized abdominal pain: Secondary | ICD-10-CM | POA: Diagnosis not present

## 2022-10-24 MED ORDER — TRAZODONE HCL 150 MG PO TABS
ORAL_TABLET | ORAL | 0 refills | Status: AC
Start: 2022-10-24 — End: ?

## 2022-10-24 NOTE — Progress Notes (Signed)
Future Appointments  Date Time Provider Department  10/24/2022  4:00 PM Lucky Cowboy, MD [facility]  11/02/2022  9:30 AM Adela Glimpse, NP [facility]  04/26/2023 11:00 AM Lucky Cowboy, MD [facility]  07/25/2023  9:30 AM Adela Glimpse, NP [facility]    History of Present Illness:     This very nice 67 y.o. MWM  with  HTN, HLD, Prediabetes and Vitamin D Deficiency presents some what "wired" & agitated relating he has been under lots of home & personal stressors  and relates that he declines to take medicine for stress or anxiety.      He had an essentially Negative Colonoscopy on 10/07/2022 by Dr Leone Payor.    He reports a 1 week prodrome of bilateral lower abdominal discomfort  from the lower mid abdomen "wrapping" around both sides toward the low back areas. Reports occasionally N & V. Also, occas loose  stool with mucoid discharge. Also patient relates difficulty falling asleep & staying      Current Outpatient Medications on File Prior to Visit  Medication Sig   aspirin EC 81 MG tablet 81 mg daily.    atorvastatin (LIPITOR) 80 MG tablet TAKE 1 TABLET BY MOUTH EVERY DAY FOR CHOLESTEROL   Cholecalciferol (VITAMIN D) 125 MCG (5000 UT) CAPS Take 1 capsule by mouth daily. 10,000 units   esomeprazole (NEXIUM) 20 MG capsule Take 20 mg by mouth daily at 12 noon. Takes OTC 20 mg   finasteride (PROSCAR) 5 MG tablet TAKE 1 TABLE TDAILY FOR PROSTATE (Patient taking differently: Take .5 table tDaily for Prostate)   hydrOXYzine (ATARAX) 25 MG tablet Take  1 to 2 tablets  1 hour  before Bedtime  as needed for Sleep   lisinopril (ZESTRIL) 10 MG tablet TAKE 1 TABLET BY MOUTH DAILY FOR BLOOD PRESSURE   MAGNESIUM PO Take by mouth.   meloxicam (MOBIC) 7.5 MG tablet Take  1 tablet  Daily  with Food for Pain & Inflammation                                                    /                                                             Take                        by                                 mouth   sildenafil (REVATIO) 20 MG tablet TAKE 1 TO 5 TABS BY MOUTH DAILY AS NEEDED.     Allergies  Allergen Reactions   Penicillins     unspecified     Problem list He has Hyperlipidemia, mixed; Essential hypertension; ASCAD s/p PTCA/Stent (2005) ; Gastroesophageal reflux disease; DVT, HX OF; Abnormal glucose; Vitamin D deficiency; Medication management; Elevated PSA; Overweight (BMI 25.0-29.9); Hx of adenomatous polyp of colon; Insomnia; and Aortic atherosclerosis (HCC) by Abd CT 09/27/2018 on their problem list.   Observations/Objective:  BP Marland Kitchen)  160/90   Pulse 95   Temp 97.9 F (36.6 C)   Resp 16   Ht 5\' 11"  (1.803 m)   Wt 188 lb (85.3 kg)   SpO2 99%   BMI 26.22 kg/m   HEENT - WNL. Neck - supple.  Chest - Clear equal BS. Cor - Nl HS. RRR w/o sig MGR. PP 1(+). No edema. Abd - - Soft w/o palpable masses or tenderness. MS- FROM w/o deformities.  Gait Nl. Neuro -  Nl w/o focal abnormalities.   Assessment and Plan:   1. Lower abdominal pain  - CBC with Differential/Platelet - COMPLETE METABOLIC PANEL WITH GFR - Amylase - Sedimentation rate - CT Angio Abd/Pel w/ and/or w/o; Future  2. Generalized abdominal pain  - CBC with Differential/Platelet - COMPLETE METABOLIC PANEL WITH GFR - Amylase - Sedimentation rate - CT Angio Abd/Pel w/ and/or w/o; Future  3. Acute midline low back pain  - CT Angio Abd/Pel w/ and/or w/o; Future  4. Primary insomnia  - traZODone (DESYREL) 150 MG tablet; Take 1/2 to 1 or 2 tablets    1 to 2 hours     before Bedtime as needed for Sleep  Dispense: 90 tablet; Refill: 0    Follow Up Instructions:        I discussed the assessment and treatment plan with the patient. The patient was provided an opportunity to ask questions and all were answered. The patient agreed with the plan and demonstrated an understanding of the instructions.       The patient was advised to call back or seek an in-person evaluation if the symptoms worsen  or if the condition fails to improve as anticipated.    Marinus Maw, MD

## 2022-10-25 ENCOUNTER — Ambulatory Visit (HOSPITAL_COMMUNITY)
Admission: RE | Admit: 2022-10-25 | Discharge: 2022-10-25 | Disposition: A | Discharge status: Home / Self Care | Payer: PPO | Source: Ambulatory Visit | Attending: Internal Medicine | Admitting: Internal Medicine

## 2022-10-25 DIAGNOSIS — R1084 Generalized abdominal pain: Secondary | ICD-10-CM | POA: Insufficient documentation

## 2022-10-25 DIAGNOSIS — K449 Diaphragmatic hernia without obstruction or gangrene: Secondary | ICD-10-CM | POA: Diagnosis not present

## 2022-10-25 DIAGNOSIS — M549 Dorsalgia, unspecified: Secondary | ICD-10-CM | POA: Diagnosis not present

## 2022-10-25 DIAGNOSIS — K5730 Diverticulosis of large intestine without perforation or abscess without bleeding: Secondary | ICD-10-CM | POA: Diagnosis not present

## 2022-10-25 DIAGNOSIS — R1030 Lower abdominal pain, unspecified: Secondary | ICD-10-CM | POA: Diagnosis not present

## 2022-10-25 DIAGNOSIS — M545 Low back pain, unspecified: Secondary | ICD-10-CM | POA: Diagnosis not present

## 2022-10-25 DIAGNOSIS — R109 Unspecified abdominal pain: Secondary | ICD-10-CM | POA: Diagnosis not present

## 2022-10-25 LAB — CBC WITH DIFFERENTIAL/PLATELET
Absolute Monocytes: 845 {cells}/uL (ref 200–950)
Basophils Absolute: 63 {cells}/uL (ref 0–200)
Basophils Relative: 0.8 %
Eosinophils Absolute: 174 {cells}/uL (ref 15–500)
Eosinophils Relative: 2.2 %
HCT: 42.8 % (ref 38.5–50.0)
Hemoglobin: 14.5 g/dL (ref 13.2–17.1)
Lymphs Abs: 1572 {cells}/uL (ref 850–3900)
MCH: 30.2 pg (ref 27.0–33.0)
MCHC: 33.9 g/dL (ref 32.0–36.0)
MCV: 89.2 fL (ref 80.0–100.0)
MPV: 11.9 fL (ref 7.5–12.5)
Monocytes Relative: 10.7 %
Neutro Abs: 5246 cells/uL (ref 1500–7800)
Neutrophils Relative %: 66.4 %
Platelets: 346 10*3/uL (ref 140–400)
RBC: 4.8 10*6/uL (ref 4.20–5.80)
RDW: 12.8 % (ref 11.0–15.0)
Total Lymphocyte: 19.9 %
WBC: 7.9 10*3/uL (ref 3.8–10.8)

## 2022-10-25 LAB — COMPLETE METABOLIC PANEL WITH GFR
AG Ratio: 2.3 (calc) (ref 1.0–2.5)
ALT: 12 U/L (ref 9–46)
AST: 13 U/L (ref 10–35)
Albumin: 4.8 g/dL (ref 3.6–5.1)
Alkaline phosphatase (APISO): 59 U/L (ref 35–144)
BUN: 20 mg/dL (ref 7–25)
CO2: 26 mmol/L (ref 20–32)
Calcium: 10 mg/dL (ref 8.6–10.3)
Chloride: 104 mmol/L (ref 98–110)
Creat: 1.2 mg/dL (ref 0.70–1.35)
Globulin: 2.1 g/dL (ref 1.9–3.7)
Glucose, Bld: 100 mg/dL — ABNORMAL HIGH (ref 65–99)
Potassium: 4.3 mmol/L (ref 3.5–5.3)
Sodium: 140 mmol/L (ref 135–146)
Total Bilirubin: 0.4 mg/dL (ref 0.2–1.2)
Total Protein: 6.9 g/dL (ref 6.1–8.1)
eGFR: 66 mL/min/{1.73_m2} (ref >=60)

## 2022-10-25 LAB — SEDIMENTATION RATE: Sed Rate: 11 mm/h (ref 0–20)

## 2022-10-25 LAB — AMYLASE: Amylase: 25 U/L (ref 21–101)

## 2022-10-25 MED ORDER — IOHEXOL 350 MG/ML SOLN
100.0000 mL | Freq: Once | INTRAVENOUS | Status: AC | PRN
  Administered 2022-10-25: 100 mL via INTRAVENOUS

## 2022-10-25 NOTE — Progress Notes (Signed)
<>*<>*<>*<>*<>*<>*<>*<>*<>*<>*<>*<>*<>*<>*<>*<>*<>*<>*<>*<>*<>*<>*<>*<>*<> <>*<>*<>*<>*<>*<>*<>*<>*<>*<>*<>*<>*<>*<>*<>*<>*<>*<>*<>*<>*<>*<>*<>*<>*<>  -    CT scan / Angiogram finds NO sign of Aortic or small artery aneurysms of clots.   - All internal organs - Kidneys , Liver, Adrenal Glands, Pancreas, Spleen, Stomach,   Small & Large Intestines, , Appendix, Bladder,  Prostate appear Normal   <>*<>*<>*<>*<>*<>*<>*<>*<>*<>*<>*<>*<>*<>*<>*<>*<>*<>*<>*<>*<>*<>*<>*<>*<> <>*<>*<>*<>*<>*<>*<>*<>*<>*<>*<>*<>*<>*<>*<>*<>*<>*<>*<>*<>*<>*<>*<>*<>*<>

## 2022-10-25 NOTE — Progress Notes (Signed)
<>*<>*<>*<>*<>*<>*<>*<>*<>*<>*<>*<>*<>*<>*<>*<>*<>*<>*<>*<>*<>*<>*<>*<>*<> <>*<>*<>*<>*<>*<>*<>*<>*<>*<>*<>*<>*<>*<>*<>*<>*<>*<>*<>*<>*<>*<>*<>*<>*<>  -  Test results slightly outside the reference range are not unusual. If there is anything important, I will review this with you,  otherwise it is considered normal test values.  If you have further questions,  please do not hesitate to contact me at the office or via My Chart.   <>*<>*<>*<>*<>*<>*<>*<>*<>*<>*<>*<>*<>*<>*<>*<>*<>*<>*<>*<>*<>*<>*<>*<>*<> <>*<>*<>*<>*<>*<>*<>*<>*<>*<>*<>*<>*<>*<>*<>*<>*<>*<>*<>*<>*<>*<>*<>*<>*<>  - Amylase  enzyme which reflects Liver , Gall Bladder  & Pancreas is Normal & OK  - Sed Rate which screens for severe inflammation is Perfectly Normal & OK   - CBC shows Normal Red cell count ( No Anemia)  & Normal WBC ( No Infection)   - Chemistry panel finds Normal Glucose (blood sugar) ,   Normal Kidney functions & Electrolytes and Normal Liver enzymes.    - All Labs Normal & OK   <>*<>*<>*<>*<>*<>*<>*<>*<>*<>*<>*<>*<>*<>*<>*<>*<>*<>*<>*<>*<>*<>*<>*<>*<> <>*<>*<>*<>*<>*<>*<>*<>*<>*<>*<>*<>*<>*<>*<>*<>*<>*<>*<>*<>*<>*<>*<>*<>*<>

## 2022-11-02 ENCOUNTER — Ambulatory Visit (INDEPENDENT_AMBULATORY_CARE_PROVIDER_SITE_OTHER): Payer: PPO | Admitting: Nurse Practitioner

## 2022-11-02 ENCOUNTER — Encounter: Payer: Self-pay | Admitting: Nurse Practitioner

## 2022-11-02 VITALS — BP 110/70 | HR 66 | Temp 97.7°F | Ht 71.0 in | Wt 191.0 lb

## 2022-11-02 DIAGNOSIS — E663 Overweight: Secondary | ICD-10-CM

## 2022-11-02 DIAGNOSIS — E559 Vitamin D deficiency, unspecified: Secondary | ICD-10-CM | POA: Diagnosis not present

## 2022-11-02 DIAGNOSIS — R7309 Other abnormal glucose: Secondary | ICD-10-CM | POA: Diagnosis not present

## 2022-11-02 DIAGNOSIS — Z79899 Other long term (current) drug therapy: Secondary | ICD-10-CM

## 2022-11-02 DIAGNOSIS — M545 Low back pain, unspecified: Secondary | ICD-10-CM

## 2022-11-02 DIAGNOSIS — I25810 Atherosclerosis of coronary artery bypass graft(s) without angina pectoris: Secondary | ICD-10-CM | POA: Diagnosis not present

## 2022-11-02 DIAGNOSIS — F5101 Primary insomnia: Secondary | ICD-10-CM | POA: Diagnosis not present

## 2022-11-02 DIAGNOSIS — I10 Essential (primary) hypertension: Secondary | ICD-10-CM

## 2022-11-02 DIAGNOSIS — E782 Mixed hyperlipidemia: Secondary | ICD-10-CM

## 2022-11-02 DIAGNOSIS — Z86718 Personal history of other venous thrombosis and embolism: Secondary | ICD-10-CM

## 2022-11-02 DIAGNOSIS — F419 Anxiety disorder, unspecified: Secondary | ICD-10-CM | POA: Diagnosis not present

## 2022-11-02 DIAGNOSIS — I700 Atherosclerosis of aorta: Secondary | ICD-10-CM

## 2022-11-02 DIAGNOSIS — Z86010 Personal history of colonic polyps: Secondary | ICD-10-CM | POA: Diagnosis not present

## 2022-11-02 NOTE — Progress Notes (Signed)
FOLLOW-UP  Assessment:   Essential hypertension Controlled Continue Lisinopril Discussed DASH (Dietary Approaches to Stop Hypertension) DASH diet is lower in sodium than a typical American diet. Cut back on foods that are high in saturated fat, cholesterol, and trans fats. Eat more whole-grain foods, fish, poultry, and nuts Remain active and exercise as tolerated daily.  Monitor BP at home-Call if greater than 130/80.  Check CMP/CBC  Atherosclerosis of coronary artery bypass graft of native heart without angina pectoris Continue Atorvastatin Control blood pressure, cholesterol, glucose, remain active.   Aortic atherosclerosis (HCC) by Abd CT 09/27/2018 Continue Atorvastatin Control blood pressure, cholesterol, glucose, remain active.   Gastroesophageal reflux disease without esophagitis Colonoscopy UTD 2019 - Recall 5 years Due 2024 Continue Esomeprazole No suspected reflux complications (Barret/stricture). Lifestyle modification:  wt loss, avoid meals 2-3h before bedtime. Consider eliminating food triggers:  chocolate, caffeine, EtOH, acid/spicy food.  Hyperlipidemia, mixed Continue Atorvastatin ASA Discussed lifestyle modifications. Recommended diet heavy in fruits and veggies, omega 3's. Decrease consumption of animal meats, cheeses, and dairy products. Remain active and exercise as tolerated. Continue to monitor. Check lipids/TSH  DVT, HX OF Monitor  Abnormal glucose Education: Reviewed 'ABCs' of diabetes management  Discussed goals to be met and/or maintained include A1C (<7) Blood pressure (<130/80) Cholesterol (LDL <70) Continue Eye Exam yearly  Continue Dental Exam Q6 mo Discussed dietary recommendations Discussed Physical Activity recommendations Foot exam UTD Check A1C  Vitamin D deficiency Continue supplement for goal of 60-100 Monitor Vitamin D levels  Elevated PSA WNL 03/2022 at 0.80 Continue Finasteride 5 mg Monitor levels during  CPE  Overweight (BMI 25.0-29.9) Discussed appropriate BMI Diet modification. Physical activity. Encouraged/praised to build confidence.  Hx of adenomatous polyp of colon Normal 2019 Colonoscopy due 2024  Insomnia, unspecified type Will not refill Xanax. Failed Hydroxyzine Continue Trazodone 150 mg  Chronic Anxiety Will not refill Xanax. Continue Lexapro 10 mg Reviewed relaxation techniques.  Sleep hygiene. Encouraged personality growth wand development through coping techniques and problem-solving skills. Limit/Decrease/Monitor drug/alcohol intake.    Acute bilateral low back pain, with sciatica. Continue Meloxicam 7.5 mg Discussed taking with food.   Currently on PPI. Stretching, alternate ice/heat. Donut seat.  Medication management Medication discussed in detail.  All questions and concerns addressed  Review of lab work stable - patient defers at this time.    Notify office for further evaluation and treatment, questions or concerns if any reported s/s fail to improve.   The patient was advised to call back or seek an in-person evaluation if any symptoms worsen or if the condition fails to improve as anticipated.   Further disposition pending results of labs. Discussed med's effects and SE's.    I discussed the assessment and treatment plan with the patient. The patient was provided an opportunity to ask questions and all were answered. The patient agreed with the plan and demonstrated an understanding of the instructions.  Discussed med's effects and SE's. Screening labs and tests as requested with regular follow-up as recommended.  I provided 25 minutes of face-to-face time during this encounter including counseling, chart review, and critical decision making was preformed.  Today's Plan of Care is based on a patient-centered health care approach known as shared decision making - the decisions, tests and treatments allow for patient preferences and values to be  balanced with clinical evidence.     Future Appointments  Date Time Provider Department Center  04/26/2023 11:00 AM Lucky Cowboy, MD [facility] None  07/25/2023  9:30 AM Adela Glimpse,  NP [facility] None    Subjective:  [name] is a 67 y.o. male who presents for a general follow up.   He retired at the end of last year and is enjoying retirement however, he continues to work part-time with IT.  Recently followed with Dr. Posey Boyer 10/24/22 for lower abd pain.  Had CT Angio Abd/Pelvis revealed stomach with notable tiny hiatal hernia, no evidence of bowel obstruction.  Diverticulosis was present without evidence of diverticulitis.  Symptoms have not resolved. Does contribute the pain to be associated and exacerbated by working on car engine, bending, twisting, lifting after having a colonoscopy procedure.    Has insomnia, was prescribed xanax 0.5 mg in the past and no longer taking.  Has tried Hydroxyzine in the past, but makes him too sleepy.  Gabapentin is not effective.  He is using Advil PM PRN with some effectiveness.  Also prescribed Buspar and recently tried Tazadone.    Endorses increase in left sided sciatica pain PRN, most notably when driving   He does not sit or ride with wallet in the back pocket.  Has purchased a donut seat which helps.   BMI is Body mass index is 26.64 kg/m., he has been working on diet, not intentionally exercising but very active all day, around home and garage/yard.  Wt Readings from Last 3 Encounters:  11/02/22 191 lb (86.6 kg)  10/24/22 188 lb (85.3 kg)  10/07/22 190 lb (86.2 kg)   2008 s/p PCA/Stents with Dr. Tenny Craw. Negative Cardiolite 2015. He has aortic atherosclerosis per CT 2020.   His blood pressure has been controlled at home, today their BP is BP: 110/70   He does not workout, does yard work. He denies chest pain, shortness of breath, dizziness.   He is on cholesterol medication (atoravstatin 80 mg daily) and denies myalgias. His  cholesterol is at goal. Will forget to take occ.  The cholesterol last visit was:   Lab Results  Component Value Date   CHOL 145 07/25/2022   HDL 51 07/25/2022   LDLCALC 71 07/25/2022   LDLDIRECT 190.6 07/30/2010   TRIG 155 (H) 07/25/2022   CHOLHDL 2.8 07/25/2022    He has been working on diet and exercise for prediabetes, and denies polydipsia, polyuria and visual disturbances. Last A1C in the office was:  Lab Results  Component Value Date   HGBA1C 6.1 (H) 07/25/2022   He does push tea/water. Last GFR:  Lab Results  Component Value Date   GFRNONAA >60 09/11/2021   GFRNONAA 70 05/20/2020   GFRNONAA 50 (L) 02/17/2020   Patient is on Vitamin D supplement.   Lab Results  Component Value Date   VD25OH 69 07/25/2022     Hx of elevated PSA, now on finasteride with improved recently.  Lab Results  Component Value Date   PSA 0.80 03/23/2022   PSA 0.36 03/04/2021   PSA 0.68 02/17/2020     Medication Review:   Current Outpatient Medications (Cardiovascular):    atorvastatin (LIPITOR) 80 MG tablet, TAKE 1 TABLET BY MOUTH EVERY DAY FOR CHOLESTEROL   lisinopril (ZESTRIL) 10 MG tablet, TAKE 1 TABLET BY MOUTH DAILY FOR BLOOD PRESSURE   sildenafil (REVATIO) 20 MG tablet, TAKE 1 TO 5 TABS BY MOUTH DAILY AS NEEDED.   Current Outpatient Medications (Analgesics):    aspirin EC 81 MG tablet, 81 mg daily.    meloxicam (MOBIC) 7.5 MG tablet, Take  1 tablet  Daily  with Food for Pain & Inflammation                                                    /  Take                        by                                mouth   Current Outpatient Medications (Other):    Cholecalciferol (VITAMIN D) 125 MCG (5000 UT) CAPS, Take 1 capsule by mouth daily. 10,000 units   esomeprazole (NEXIUM) 20 MG capsule, Take 20 mg by mouth daily at 12 noon. Takes OTC 20 mg   finasteride (PROSCAR) 5 MG tablet, TAKE 1 TABLE TDAILY FOR PROSTATE (Patient  taking differently: Take .5 table tDaily for Prostate)   hydrOXYzine (ATARAX) 25 MG tablet, Take  1 to 2 tablets  1 hour  before Bedtime  as needed for Sleep   MAGNESIUM PO, Take by mouth.   traZODone (DESYREL) 150 MG tablet, Take 1/2 to 1 or 2 tablets    1 to 2 hours     before Bedtime as needed for Sleep  Allergies: Allergies  Allergen Reactions   Penicillins     unspecified    Current Problems (verified) Patient Active Problem List   Diagnosis Date Noted   Aortic atherosclerosis (HCC) by Abd CT 09/27/2018 03/27/2019   Insomnia 02/21/2019   Overweight (BMI 25.0-29.9) 03/29/2017   Elevated PSA 09/16/2015   Abnormal glucose 07/24/2013   Vitamin D deficiency 07/24/2013   Medication management 07/24/2013   Hx of adenomatous polyp of colon 06/09/2010   Essential hypertension 08/05/2008   Hyperlipidemia, mixed 08/02/2006   ASCAD s/p PTCA/Stent (2005)  08/02/2006   Gastroesophageal reflux disease 08/02/2006   DVT, HX OF 08/02/2006    Screening Tests Immunization History  Administered Date(s) Administered   Influenza, High Dose Seasonal PF 12/22/2020   Influenza,inj,Quad PF,6+ Mos 12/07/2016, 11/21/2018   Influenza-Unspecified 01/28/2018   Moderna Sars-Covid-2 Vaccination 03/01/2019, 04/05/2019, 01/27/2020   PPD Test 07/24/2013, 07/30/2014, 09/16/2016, 10/23/2017   Tdap 07/30/2010     Patient Care Team: Lucky Cowboy, MD as PCP - General (Internal Medicine)  Surgical: Past Surgical History:  Procedure Laterality Date   COLONOSCOPY     COLONOSCOPY W/ POLYPECTOMY  06/16/2010   diminutive adenoma, diverticulosis, hemorrhoids   CORONARY ANGIOPLASTY WITH STENT PLACEMENT  2005   ESOPHAGOGASTRODUODENOSCOPY  06/16/2010   antral ulcers, esophageal stricture - dilated, duodenitis, hiatal hernia   HERNIA REPAIR Right    INGUINAL HERNIA REPAIR Right 11/05/2018   Procedure: OPEN RIGHT INGUINAL HERNIA REPAIR WITH MESH;  Surgeon: Abigail Miyamoto, MD;  Location: [facility];  Service: General;  Laterality: Right;  TAP BLOCK   PILONIDAL CYST EXCISION     teenager   POLYPECTOMY     UPPER GASTROINTESTINAL ENDOSCOPY     Family His family history includes Colon cancer in his father; Colon polyps in his father; Dementia in his mother. Social history  He reports that he has never smoked. He has been exposed to tobacco smoke. He has never used smokeless tobacco. He reports current alcohol use. He reports that he does not use drugs.  Review of Systems  Constitutional:  Negative for malaise/fatigue and weight loss.  HENT:  Negative for hearing loss and tinnitus.   Eyes:  Negative for blurred vision and double vision.  Respiratory:  Negative for cough, sputum production, shortness of breath and wheezing.   Cardiovascular:  Negative for chest pain, palpitations, orthopnea, claudication, leg swelling and PND.  Gastrointestinal:  Negative for abdominal pain, blood in stool, constipation, diarrhea, heartburn, melena, nausea and vomiting.  Genitourinary: Negative.   Musculoskeletal:  Negative for falls, joint pain and myalgias.  Skin:  Negative for rash.  Neurological:  Negative for dizziness, tingling, sensory change, weakness and headaches.  Endo/Heme/Allergies:  Negative for polydipsia.  Psychiatric/Behavioral: Negative.  Negative for depression, memory loss, substance abuse and suicidal ideas. The patient is not nervous/anxious and does not have insomnia.   All other systems reviewed and are negative.    Objective:     Today's Vitals   11/02/22 0920  BP: 110/70  Pulse: 66  Temp: 97.7 F (36.5 C)  SpO2: 99%  Weight: 191 lb (86.6 kg)  Height: 5\' 11"  (1.803 m)    Body mass index is 26.64 kg/m.  General appearance: alert, no distress, WD/WN, male HEENT: normocephalic, sclerae anicteric, TMs pearly, nares patent, no discharge or erythema, pharynx normal Oral cavity: MMM, no lesions Neck: supple, no lymphadenopathy, no thyromegaly, no  masses Heart: RRR, normal S1, S2, no murmurs Lungs: CTA bilaterally, no wheezes, rhonchi, or rales Abdomen: +bs, soft, non tender, non distended, no masses, no hepatomegaly, no splenomegaly Musculoskeletal: nontender, no swelling, no obvious deformity Extremities: no edema, no cyanosis, no clubbing Pulses: 2+ symmetric, upper and lower extremities, normal cap refill Neurological: alert, oriented x 3, CN2-12 intact, strength normal upper extremities and lower extremities, sensation normal throughout, DTRs 2+ throughout, no cerebellar signs, gait Normal Psychiatric: normal affect, behavior normal, pleasant   Hadyn Azer, NP   11/02/2022

## 2022-11-02 NOTE — Patient Instructions (Signed)

## 2023-01-10 ENCOUNTER — Encounter (HOSPITAL_BASED_OUTPATIENT_CLINIC_OR_DEPARTMENT_OTHER): Payer: Self-pay

## 2023-01-10 ENCOUNTER — Emergency Department (HOSPITAL_BASED_OUTPATIENT_CLINIC_OR_DEPARTMENT_OTHER)
Admission: EM | Admit: 2023-01-10 | Discharge: 2023-01-10 | Disposition: A | Discharge status: Home / Self Care | Payer: PPO | Attending: Emergency Medicine | Admitting: Emergency Medicine

## 2023-01-10 ENCOUNTER — Other Ambulatory Visit: Payer: Self-pay

## 2023-01-10 ENCOUNTER — Emergency Department (HOSPITAL_BASED_OUTPATIENT_CLINIC_OR_DEPARTMENT_OTHER): Payer: PPO

## 2023-01-10 DIAGNOSIS — K5730 Diverticulosis of large intestine without perforation or abscess without bleeding: Secondary | ICD-10-CM | POA: Diagnosis not present

## 2023-01-10 DIAGNOSIS — E039 Hypothyroidism, unspecified: Secondary | ICD-10-CM | POA: Insufficient documentation

## 2023-01-10 DIAGNOSIS — R109 Unspecified abdominal pain: Secondary | ICD-10-CM | POA: Diagnosis not present

## 2023-01-10 DIAGNOSIS — R1030 Lower abdominal pain, unspecified: Secondary | ICD-10-CM | POA: Diagnosis not present

## 2023-01-10 DIAGNOSIS — I10 Essential (primary) hypertension: Secondary | ICD-10-CM | POA: Insufficient documentation

## 2023-01-10 DIAGNOSIS — Z7982 Long term (current) use of aspirin: Secondary | ICD-10-CM | POA: Insufficient documentation

## 2023-01-10 DIAGNOSIS — I2510 Atherosclerotic heart disease of native coronary artery without angina pectoris: Secondary | ICD-10-CM | POA: Diagnosis not present

## 2023-01-10 DIAGNOSIS — Z79899 Other long term (current) drug therapy: Secondary | ICD-10-CM | POA: Insufficient documentation

## 2023-01-10 DIAGNOSIS — R1031 Right lower quadrant pain: Secondary | ICD-10-CM | POA: Insufficient documentation

## 2023-01-10 DIAGNOSIS — D72829 Elevated white blood cell count, unspecified: Secondary | ICD-10-CM | POA: Diagnosis not present

## 2023-01-10 LAB — URINALYSIS, ROUTINE W REFLEX MICROSCOPIC
Bilirubin Urine: NEGATIVE
Glucose, UA: NEGATIVE mg/dL
Hgb urine dipstick: NEGATIVE
Ketones, ur: NEGATIVE mg/dL
Leukocytes,Ua: NEGATIVE
Nitrite: NEGATIVE
Protein, ur: NEGATIVE mg/dL
Specific Gravity, Urine: 1.024 (ref 1.005–1.030)
pH: 5.5 (ref 5.0–8.0)

## 2023-01-10 LAB — CBC WITH DIFFERENTIAL/PLATELET
Abs Immature Granulocytes: 0.03 10*3/uL (ref 0.00–0.07)
Basophils Absolute: 0.1 10*3/uL (ref 0.0–0.1)
Basophils Relative: 1 %
Eosinophils Absolute: 0.2 10*3/uL (ref 0.0–0.5)
Eosinophils Relative: 1 %
HCT: 41.8 % (ref 39.0–52.0)
Hemoglobin: 14.2 g/dL (ref 13.0–17.0)
Immature Granulocytes: 0 %
Lymphocytes Relative: 8 %
Lymphs Abs: 1 10*3/uL (ref 0.7–4.0)
MCH: 30.4 pg (ref 26.0–34.0)
MCHC: 34 g/dL (ref 30.0–36.0)
MCV: 89.5 fL (ref 80.0–100.0)
Monocytes Absolute: 0.5 10*3/uL (ref 0.1–1.0)
Monocytes Relative: 4 %
Neutro Abs: 10.6 10*3/uL — ABNORMAL HIGH (ref 1.7–7.7)
Neutrophils Relative %: 86 %
Platelets: 296 10*3/uL (ref 150–400)
RBC: 4.67 MIL/uL (ref 4.22–5.81)
RDW: 14.2 % (ref 11.5–15.5)
WBC: 12.4 10*3/uL — ABNORMAL HIGH (ref 4.0–10.5)
nRBC: 0 % (ref 0.0–0.2)

## 2023-01-10 LAB — COMPREHENSIVE METABOLIC PANEL
ALT: 15 U/L (ref 0–44)
AST: 13 U/L — ABNORMAL LOW (ref 15–41)
Albumin: 4.4 g/dL (ref 3.5–5.0)
Alkaline Phosphatase: 52 U/L (ref 38–126)
Anion gap: 9 (ref 5–15)
BUN: 24 mg/dL — ABNORMAL HIGH (ref 8–23)
CO2: 24 mmol/L (ref 22–32)
Calcium: 9.5 mg/dL (ref 8.9–10.3)
Chloride: 105 mmol/L (ref 98–111)
Creatinine, Ser: 1.27 mg/dL — ABNORMAL HIGH (ref 0.61–1.24)
GFR, Estimated: >60 mL/min (ref >60)
Glucose, Bld: 153 mg/dL — ABNORMAL HIGH (ref 70–99)
Potassium: 4.1 mmol/L (ref 3.5–5.1)
Sodium: 138 mmol/L (ref 135–145)
Total Bilirubin: 0.4 mg/dL (ref <1.2)
Total Protein: 6.8 g/dL (ref 6.5–8.1)

## 2023-01-10 MED ORDER — KETOROLAC TROMETHAMINE 15 MG/ML IJ SOLN
15.0000 mg | Freq: Once | INTRAMUSCULAR | Status: AC
  Administered 2023-01-10: 15 mg via INTRAVENOUS
  Filled 2023-01-10: qty 1

## 2023-01-10 MED ORDER — ONDANSETRON HCL 4 MG/2ML IJ SOLN
4.0000 mg | Freq: Once | INTRAMUSCULAR | Status: AC
  Administered 2023-01-10: 4 mg via INTRAVENOUS
  Filled 2023-01-10: qty 2

## 2023-01-10 MED ORDER — MORPHINE SULFATE (PF) 4 MG/ML IV SOLN
4.0000 mg | Freq: Once | INTRAVENOUS | Status: AC
  Administered 2023-01-10: 4 mg via INTRAVENOUS
  Filled 2023-01-10: qty 1

## 2023-01-10 MED ORDER — OXYCODONE-ACETAMINOPHEN 5-325 MG PO TABS
1.0000 | ORAL_TABLET | Freq: Once | ORAL | Status: AC
  Administered 2023-01-10: 1 via ORAL
  Filled 2023-01-10: qty 1

## 2023-01-10 MED ORDER — OXYCODONE-ACETAMINOPHEN 5-325 MG PO TABS: 1.0000 | ORAL_TABLET | Freq: Four times a day (QID) | ORAL | 0 refills | Status: DC | PRN

## 2023-01-10 NOTE — ED Triage Notes (Signed)
Pt reports severe right lower back/flank pain that started last Wednesday that initially was improving through thanksgiving and then starting getting worse again on Saturday. Pt reports feeling pain after bending over initially. No history of kidney stones. Pt denies any urinary symptoms. Hx of right inguinal hernia with 2 surgical repairs. Most recent repair 2 years ago. Pt reports pain in same area. No obvious reoccurrence/swelling. Denies fevers.

## 2023-01-10 NOTE — ED Provider Notes (Signed)
[place] EMERGENCY DEPARTMENT AT [facility] Provider Note   CSN: [id number] Arrival date & time: 01/10/23  0222     History  Chief Complaint  Patient presents with   Flank Pain    [name] is a 67 y.o. male.  HPI     This is a 67 year old male who presents with right-sided flank pain.  Onset of symptoms last Wednesday.  He states initially he thought he pulled a muscle and was taking meloxicam with some improvement.  However that over the weekend became more constant and he has not been able to get relief.  He is also developed emesis.  He states the pain radiates into his right groin.  He has a history of inguinal hernia on the right side but has not noted any increased bulging.  He has not noted any hematuria or dysuria.  No fevers.  Denies any history of kidney stones.  Home Medications Prior to Admission medications   Medication Sig Start Date End Date Taking? Authorizing Provider  oxyCODONE-acetaminophen (PERCOCET/ROXICET) 5-325 MG tablet Take 1 tablet by mouth every 6 (six) hours as needed for severe pain (pain score 7-10). 01/10/23  Yes Beonka Amesquita, Mayer Masker, MD  aspirin EC 81 MG tablet 81 mg daily.     [provider]  atorvastatin (LIPITOR) 80 MG tablet TAKE 1 TABLET BY MOUTH EVERY DAY FOR CHOLESTEROL 08/08/22   Adela Glimpse, NP  Cholecalciferol (VITAMIN D) 125 MCG (5000 UT) CAPS Take 1 capsule by mouth daily. 10,000 units    [provider]  esomeprazole (NEXIUM) 20 MG capsule Take 20 mg by mouth daily at 12 noon. Takes OTC 20 mg    [provider]  finasteride (PROSCAR) 5 MG tablet TAKE 1 TABLE TDAILY FOR PROSTATE Patient taking differently: Take .5 table tDaily for Prostate 12/23/21   Raynelle Dick, NP  hydrOXYzine (ATARAX) 25 MG tablet Take  1 to 2 tablets  1 hour  before Bedtime  as needed for Sleep 03/05/21   Lucky Cowboy, MD  lisinopril (ZESTRIL) 10 MG tablet TAKE 1 TABLET BY MOUTH DAILY FOR BLOOD PRESSURE 10/13/22    Raynelle Dick, NP  MAGNESIUM PO Take by mouth.    [provider]  meloxicam (MOBIC) 7.5 MG tablet Take  1 tablet  Daily  with Food for Pain & Inflammation                                                    /                                                             Take                        by                                mouth 01/10/22   Lucky Cowboy, MD  sildenafil (REVATIO) 20 MG tablet TAKE 1 TO 5 TABS BY MOUTH DAILY AS NEEDED. 10/14/16   Lucky Cowboy, MD  traZODone (DESYREL) 150 MG tablet Take 1/2 to 1 or 2 tablets    1 to 2 hours     before Bedtime as needed for Sleep 10/24/22   Lucky Cowboy, MD      Allergies    Penicillins    Review of Systems   Review of Systems  Constitutional:  Negative for fever.  Respiratory:  Negative for shortness of breath.   Cardiovascular:  Negative for chest pain.  Gastrointestinal:  Positive for nausea and vomiting. Negative for abdominal pain.  Genitourinary:  Positive for flank pain. Negative for difficulty urinating, dysuria and hematuria.  All other systems reviewed and are negative.   Physical Exam Updated Vital Signs BP (!) 141/71   Pulse 70   Temp 98.7 F (37.1 C) (Oral)   Resp (!) 22   Ht 1.778 m (5\' 10" )   Wt 84.8 kg   SpO2 100%   BMI 26.83 kg/m  Physical Exam Vitals and nursing note reviewed.  Constitutional:      Appearance: He is well-developed.     Comments: Uncomfortable appearing but nontoxic  HENT:     Head: Normocephalic and atraumatic.  Eyes:     Pupils: Pupils are equal, round, and reactive to light.  Cardiovascular:     Rate and Rhythm: Normal rate and regular rhythm.     Heart sounds: Normal heart sounds. No murmur heard. Pulmonary:     Effort: Pulmonary effort is normal. No respiratory distress.     Breath sounds: Normal breath sounds. No wheezing.  Abdominal:     General: Bowel sounds are normal.     Palpations: Abdomen is soft.     Tenderness: There is no abdominal tenderness.  There is no right CVA tenderness, left CVA tenderness or rebound.  Genitourinary:    Comments: No inguinal bulging Musculoskeletal:     Cervical back: Neck supple.  Lymphadenopathy:     Cervical: No cervical adenopathy.  Skin:    General: Skin is warm and dry.  Neurological:     Mental Status: He is alert and oriented to person, place, and time.  Psychiatric:        Mood and Affect: Mood normal.     ED Results / Procedures / Treatments   Labs (all labs ordered are listed, but only abnormal results are displayed) Labs Reviewed  CBC WITH DIFFERENTIAL/PLATELET - Abnormal; Notable for the following components:      Result Value   WBC 12.4 (*)    Neutro Abs 10.6 (*)    All other components within normal limits  COMPREHENSIVE METABOLIC PANEL - Abnormal; Notable for the following components:   Glucose, Bld 153 (*)    BUN 24 (*)    Creatinine, Ser 1.27 (*)    AST 13 (*)    All other components within normal limits  URINALYSIS, ROUTINE W REFLEX MICROSCOPIC    EKG None  Radiology CT Renal Stone Study  Result Date: 01/10/2023 CLINICAL DATA:  67 year old male with abdomen and flank pain. Right flank, low back pain since last week. EXAM: CT ABDOMEN AND PELVIS WITHOUT CONTRAST TECHNIQUE: Multidetector CT imaging of the abdomen and pelvis was performed following the standard protocol without IV contrast. RADIATION DOSE REDUCTION: This exam was performed according to the departmental dose-optimization program which includes automated exposure control, adjustment of the mA and/or kV according to patient size and/or use of iterative reconstruction technique. COMPARISON:  CTA abdomen and Pelvis 10/25/2022. FINDINGS: Lower chest: Calcified coronary artery atherosclerosis. Otherwise negative. No  pericardial or pleural effusion. Hepatobiliary: Negative noncontrast liver and gallbladder. Pancreas: Negative. Spleen: Negative. Adrenals/Urinary Tract: Normal adrenal glands. Punctate right upper pole  nephrolithiasis. Decompressed and normal right ureter to the bladder. No convincing right hydronephrosis or asymmetric pararenal stranding. No convincing contralateral left nephrolithiasis and the left kidney also appears nonobstructed. Decompressed left ureter. Decompressed and unremarkable urinary bladder. Incidental pelvic phleboliths. Stomach/Bowel: Negative rectum and distal sigmoid colon. Extensive sigmoid diverticulosis. Extensive descending colon diverticulosis. But no definite active inflammation when compared to the September CTA. No convincing acute mesenteric stranding. Mild generalized retained stool in the large bowel. Normal appendix arising medial from the cecum series 2, image 59. Decompressed terminal ileum. No dilated small bowel. Small gastric hiatal hernia versus phrenic ampulla (the latter a normal variant). Otherwise negative noncontrast stomach and duodenum. No free air or free fluid. Vascular/Lymphatic: Aortoiliac calcified atherosclerosis. Normal caliber abdominal aorta. Vascular patency is not evaluated in the absence of IV contrast. No lymphadenopathy identified. Reproductive: Negative. Other: No pelvis free fluid. Musculoskeletal: No acute osseous abnormality identified. Mild for age lumbar spine degeneration. IMPRESSION: 1. Punctate right nephrolithiasis, but no evidence of obstructive uropathy. 2. Extensive descending and sigmoid colon diverticulosis, but no convincing active inflammation. 3. No other acute or inflammatory process in the noncontrast Abdomen and Pelvis normal appendix. 4.  Aortic Atherosclerosis (ICD10-I70.0). Electronically Signed   By: Odessa Fleming M.D.   On: 01/10/2023 06:13    Procedures Procedures    Medications Ordered in ED Medications  morphine (PF) 4 MG/ML injection 4 mg (4 mg Intravenous Given 01/10/23 0323)  ondansetron (ZOFRAN) injection 4 mg (4 mg Intravenous Given 01/10/23 0322)  ketorolac (TORADOL) 15 MG/ML injection 15 mg (15 mg Intravenous Given  01/10/23 2952)  oxyCODONE-acetaminophen (PERCOCET/ROXICET) 5-325 MG per tablet 1 tablet (1 tablet Oral Given 01/10/23 8413)    ED Course/ Medical Decision Making/ A&P                                 Medical Decision Making Amount and/or Complexity of Data Reviewed Labs: ordered. Radiology: ordered.  Risk Prescription drug management.   This patient presents to the ED for concern of right flank pain, this involves an extensive number of treatment options, and is a complaint that carries with it a high risk of complications and morbidity.  I considered the following differential and admission for this acute, potentially life threatening condition.  The differential diagnosis includes kidney stone, pyelonephritis, colitis, pulled muscle  MDM:    This is a 67 year old male who presents with concern for right flank pain.  He is overall uncomfortable appearing but nontoxic.  He has no CVA tenderness.  No fever.  Abdominal exam is benign.  No bulging or evidence of recurrent hernia.  Patient was given pain and nausea medication.  Labs obtained.  CBC, BMP, urinalysis.  Notable for slight leukocytosis but no significant metabolic derangements.  Urinalysis not consistent with UTI.  CT stone study obtained.  Patient does have kidney stones in his kidney but no noted obstructing stones.  No other obvious source of the patient's pain.  On recheck, he states he feels much better but continues to have some pain when he gets up and moves around.  Question musculoskeletal etiology.  Will treat with pain medication and NSAIDs at home.  (Labs, imaging, consults)  Labs: I Ordered, and personally interpreted labs.  The pertinent results include: CBC, BMP, urinalysis  Imaging Studies ordered:  I ordered imaging studies including CT stone study I independently visualized and interpreted imaging. I agree with the radiologist interpretation  Additional history obtained from chart review.  External records from  outside source obtained and reviewed including prior evaluations  Cardiac Monitoring: The patient was maintained on a cardiac monitor.  If on the cardiac monitor, I personally viewed and interpreted the cardiac monitored which showed an underlying rhythm of: Sinus rhythm  Reevaluation: After the interventions noted above, I reevaluated the patient and found that they have :improved  Social Determinants of Health:  lives independently  Disposition: Discharge  Co morbidities that complicate the patient evaluation  Past Medical History:  Diagnosis Date   ANXIETY 12/25/2007   ANXIETY STATE NEC 11/01/2006   CORONARY ARTERY DISEASE 08/02/2006   off Plavix x 3 years   DIVERTICULITIS OF COLON 01/06/2010   DVT, HX OF 08/02/2006   GERD with stricture 08/02/2006   History of thyroiditis 12/15/2018   Hx of adenomatous polyp of colon 06/09/2010   HYPERLIPIDEMIA 08/02/2006   HYPERTENSION 08/05/2008   Hypothyroidism 03/19/2014   Memory loss 07/21/2009   Pilonidal cyst    low back pain   Prostatitis 09/16/2015   Recurrent inguinal hernia    right     Medicines Meds ordered this encounter  Medications   morphine (PF) 4 MG/ML injection 4 mg   ondansetron (ZOFRAN) injection 4 mg   ketorolac (TORADOL) 15 MG/ML injection 15 mg   oxyCODONE-acetaminophen (PERCOCET/ROXICET) 5-325 MG per tablet 1 tablet   oxyCODONE-acetaminophen (PERCOCET/ROXICET) 5-325 MG tablet    Sig: Take 1 tablet by mouth every 6 (six) hours as needed for severe pain (pain score 7-10).    Dispense:  10 tablet    Refill:  0    I have reviewed the patients home medicines and have made adjustments as needed  Problem List / ED Course: Problem List Items Addressed This Visit   None Visit Diagnoses     Flank pain    -  Primary                   Final Clinical Impression(s) / ED Diagnoses Final diagnoses:  Flank pain    Rx / DC Orders ED Discharge Orders          Ordered    oxyCODONE-acetaminophen  (PERCOCET/ROXICET) 5-325 MG tablet  Every 6 hours PRN        01/10/23 0633              Shon Baton, MD 01/10/23 [phone]

## 2023-01-10 NOTE — Discharge Instructions (Signed)
You were seen today for flank pain.  Your workup is largely reassuring.  You could have recently passed a stone but there is no obstructing stone on your CT scan.  This could also be musculoskeletal in nature.  Take medications as prescribed.  If you have any new or worsening symptoms, you should be reevaluated.

## 2023-01-10 NOTE — ED Notes (Signed)
ED Provider at bedside. 

## 2023-01-11 ENCOUNTER — Ambulatory Visit (INDEPENDENT_AMBULATORY_CARE_PROVIDER_SITE_OTHER): Payer: PPO | Admitting: Nurse Practitioner

## 2023-01-11 ENCOUNTER — Encounter: Payer: Self-pay | Admitting: Nurse Practitioner

## 2023-01-11 VITALS — BP 138/74 | HR 76 | Temp 98.8°F | Ht 70.0 in | Wt 194.2 lb

## 2023-01-11 DIAGNOSIS — Z79899 Other long term (current) drug therapy: Secondary | ICD-10-CM

## 2023-01-11 DIAGNOSIS — Z09 Encounter for follow-up examination after completed treatment for conditions other than malignant neoplasm: Secondary | ICD-10-CM

## 2023-01-11 DIAGNOSIS — N200 Calculus of kidney: Secondary | ICD-10-CM

## 2023-01-11 MED ORDER — HYDROCODONE-ACETAMINOPHEN 5-325 MG PO TABS: 1.0000 | ORAL_TABLET | Freq: Four times a day (QID) | ORAL | 0 refills | Status: DC | PRN

## 2023-01-11 NOTE — Progress Notes (Signed)
Hospital follow up  Assessment and Plan: Hospital visit follow up for:   Hospital discharge follow-up Reviewed discharge instructions in full including medication changes, diagnostics, labs, and future follow ups appointment. All questions and concerns addressed.   Nephrolithiasis Refer to Urology if s/s fail to improve. Change Percocet to Norco considering unmanaged pain.  Continue Meloxicam as directed. Heating pad PRN Stay well hydrated to keep urinary system well flushed.   - HYDROcodone-acetaminophen (NORCO) 5-325 MG tablet; Take 1 tablet by mouth every 6 (six) hours as needed.  Dispense: 8 tablet; Refill: 0  Medication management All medications discussed and reviewed in full. All questions and concerns regarding medications addressed.     All medications were reviewed with patient and family and fully reconciled. All questions answered fully, and patient and family members were encouraged to call the office with any further questions or concerns. Discussed goal to avoid readmission related to this diagnosis.   Over 30 minutes of exam, counseling, chart review, and complex, high/moderate level critical decision making was performed this visit.   Future Appointments  Date Time Provider Department Center  04/26/2023 11:00 AM Lucky Cowboy, MD [facility] None  07/25/2023  9:30 AM Adela Glimpse, NP [facility] None     HPI 67 y.o.male presents for follow up for transition from recent ER stay. Admit date to the hospital was 01/10/23, patient was discharged from the hospital on 01/10/23 and our clinical staff contacted the office the day after discharge to set up a follow up appointment. The discharge summary, medications, and diagnostic test results were reviewed before meeting with the patient. The patient was admitted for treatment of nephrolithiasis:   This is a 67 year old male who presented to ER with  right-sided flank pain. Onset of symptoms one week prior but has a hx of  low back pain. He states initially he thought he pulled a muscle and was taking meloxicam with some improvement. However over the weekend became more constant and he has not been able to get relief. He also  developed emesis. He stated the pain radiated into his right groin. He has a history of inguinal hernia on the right side but had not noted any increased bulging. He had not noted any hematuria or dysuria. No fevers. Denies any history of kidney stones.   He was noted to have slight leukocytosis but no significant metabolic derangements.  UA not consistent with UTI.  CT Scan revealed kidney stones but noted no obstructing stones.  Most likely passes stone.  He was treated with Morphine and Percocet and discharged.  He is continuing to experience significant pain in the back and right groin.  Unable to remain comfortable in the chair during office visit.  Rates pain 7-8/10.    Images while in the hospital: CT Renal Stone Study  Result Date: 01/10/2023 CLINICAL DATA:  67 year old male with abdomen and flank pain. Right flank, low back pain since last week. EXAM: CT ABDOMEN AND PELVIS WITHOUT CONTRAST TECHNIQUE: Multidetector CT imaging of the abdomen and pelvis was performed following the standard protocol without IV contrast. RADIATION DOSE REDUCTION: This exam was performed according to the departmental dose-optimization program which includes automated exposure control, adjustment of the mA and/or kV according to patient size and/or use of iterative reconstruction technique. COMPARISON:  CTA abdomen and Pelvis 10/25/2022. FINDINGS: Lower chest: Calcified coronary artery atherosclerosis. Otherwise negative. No pericardial or pleural effusion. Hepatobiliary: Negative noncontrast liver and gallbladder. Pancreas: Negative. Spleen: Negative. Adrenals/Urinary Tract: Normal adrenal glands. Punctate right upper  pole nephrolithiasis. Decompressed and normal right ureter to the bladder. No convincing right  hydronephrosis or asymmetric pararenal stranding. No convincing contralateral left nephrolithiasis and the left kidney also appears nonobstructed. Decompressed left ureter. Decompressed and unremarkable urinary bladder. Incidental pelvic phleboliths. Stomach/Bowel: Negative rectum and distal sigmoid colon. Extensive sigmoid diverticulosis. Extensive descending colon diverticulosis. But no definite active inflammation when compared to the September CTA. No convincing acute mesenteric stranding. Mild generalized retained stool in the large bowel. Normal appendix arising medial from the cecum series 2, image 59. Decompressed terminal ileum. No dilated small bowel. Small gastric hiatal hernia versus phrenic ampulla (the latter a normal variant). Otherwise negative noncontrast stomach and duodenum. No free air or free fluid. Vascular/Lymphatic: Aortoiliac calcified atherosclerosis. Normal caliber abdominal aorta. Vascular patency is not evaluated in the absence of IV contrast. No lymphadenopathy identified. Reproductive: Negative. Other: No pelvis free fluid. Musculoskeletal: No acute osseous abnormality identified. Mild for age lumbar spine degeneration. IMPRESSION: 1. Punctate right nephrolithiasis, but no evidence of obstructive uropathy. 2. Extensive descending and sigmoid colon diverticulosis, but no convincing active inflammation. 3. No other acute or inflammatory process in the noncontrast Abdomen and Pelvis normal appendix. 4.  Aortic Atherosclerosis (ICD10-I70.0). Electronically Signed   By: Odessa Fleming M.D.   On: 01/10/2023 06:13      Current Outpatient Medications (Cardiovascular):    atorvastatin (LIPITOR) 80 MG tablet, TAKE 1 TABLET BY MOUTH EVERY DAY FOR CHOLESTEROL   lisinopril (ZESTRIL) 10 MG tablet, TAKE 1 TABLET BY MOUTH DAILY FOR BLOOD PRESSURE   sildenafil (REVATIO) 20 MG tablet, TAKE 1 TO 5 TABS BY MOUTH DAILY AS NEEDED.   Current Outpatient Medications (Analgesics):    aspirin EC 81 MG  tablet, 81 mg daily.    meloxicam (MOBIC) 7.5 MG tablet, Take  1 tablet  Daily  with Food for Pain & Inflammation                                                    /                                                             Take                        by                                mouth   oxyCODONE-acetaminophen (PERCOCET/ROXICET) 5-325 MG tablet, Take 1 tablet by mouth every 6 (six) hours as needed for severe pain (pain score 7-10).   Current Outpatient Medications (Other):    Cholecalciferol (VITAMIN D) 125 MCG (5000 UT) CAPS, Take 1 capsule by mouth daily. 10,000 units   esomeprazole (NEXIUM) 20 MG capsule, Take 20 mg by mouth daily at 12 noon. Takes OTC 20 mg   finasteride (PROSCAR) 5 MG tablet, TAKE 1 TABLE TDAILY FOR PROSTATE (Patient taking differently: Take .5 table tDaily for Prostate)   hydrOXYzine (ATARAX) 25 MG tablet, Take  1 to 2 tablets  1 hour  before Bedtime  as needed  for Sleep   MAGNESIUM PO, Take by mouth.   traZODone (DESYREL) 150 MG tablet, Take 1/2 to 1 or 2 tablets    1 to 2 hours     before Bedtime as needed for Sleep  Past Medical History:  Diagnosis Date   ANXIETY 12/25/2007   ANXIETY STATE NEC 11/01/2006   CORONARY ARTERY DISEASE 08/02/2006   off Plavix x 3 years   DIVERTICULITIS OF COLON 01/06/2010   DVT, HX OF 08/02/2006   GERD with stricture 08/02/2006   History of thyroiditis 12/15/2018   Hx of adenomatous polyp of colon 06/09/2010   HYPERLIPIDEMIA 08/02/2006   HYPERTENSION 08/05/2008   Hypothyroidism 03/19/2014   Memory loss 07/21/2009   Pilonidal cyst    low back pain   Prostatitis 09/16/2015   Recurrent inguinal hernia    right     Allergies  Allergen Reactions   Penicillins     unspecified    ROS: all negative except above.   Physical Exam: Filed Weights   01/11/23 0924  Weight: 194 lb 3.2 oz (88.1 kg)   BP 138/74   Pulse 76   Temp 98.8 F (37.1 C)   Ht 5\' 10"  (1.778 m)   Wt 194 lb 3.2 oz (88.1 kg)   SpO2 99%   BMI 27.86 kg/m   General Appearance: Well nourished, in no apparent distress. Eyes: PERRLA, EOMs, conjunctiva no swelling or erythema Sinuses: No Frontal/maxillary tenderness ENT/Mouth: Ext aud canals clear, TMs without erythema, bulging. No erythema, swelling, or exudate on post pharynx.  Tonsils not swollen or erythematous. Hearing normal.  Neck: Supple, thyroid normal.  Respiratory: Respiratory effort normal, BS equal bilaterally without rales, rhonchi, wheezing or stridor.  Cardio: RRR with no MRGs. Brisk peripheral pulses without edema.  Abdomen: Soft, + BS.  Non tender, no guarding, rebound, hernias, masses. Lymphatics: Non tender without lymphadenopathy.  Musculoskeletal: Full ROM, 5/5 strength, normal gait.  Skin: Warm, dry without rashes, lesions, ecchymosis.  Neuro: Cranial nerves intact. Normal muscle tone, no cerebellar symptoms. Sensation intact.  Psych: Awake and oriented X 3, normal affect, Insight and Judgment appropriate.     Adela Glimpse, NP 9:38 AM [facility]

## 2023-01-11 NOTE — Patient Instructions (Signed)
 Kidney Stones Kidney stones are rock-like masses that form inside of the kidneys. Kidneys are organs that make pee (urine). A kidney stone may move into other parts of the urinary tract, including: The tubes that connect the kidneys to the bladder (ureters). The bladder. The tube that carries urine out of the body (urethra). Kidney stones can cause very bad pain and can block the flow of pee. The stone usually leaves your body through your pee. A doctor may need to take out the stone. What are the causes? Kidney stones may be caused by: Too much calcium in the body. This may be caused by too much parathyroid hormone in the blood. Uric acid crystals in the bladder. The body makes uric acid when you eat certain foods. Narrowing of one or both of the ureters. A kidney blockage that you were born with. Past surgery on the kidney or the ureters. What increases the risk? You are more likely to develop this condition if: You have had a kidney stone in the past. Other people in your family have had kidney stones. You do not drink enough water. You eat a diet that is high in protein, salt (sodium), or sugar. You are very overweight (obese). What are the signs or symptoms? Symptoms of a kidney stone may include: Pain in the side of the belly, right below the ribs. Pain usually spreads to the groin. Needing to pee often or right away. Pain when peeing. Blood in your pee. Feeling like you may vomit (nauseous). Vomiting. Fever and chills. How is this treated? Treatment depends on the size, location, and makeup of the kidney stones. The stones will often pass out of the body when you pee. You may need to: Drink more fluid to help pass the stone. In some cases, you may be given fluids through an IV tube at the hospital. Take medicine for pain. Change your diet to help keep kidney stones from coming back. Sometimes, you may need: A procedure to break up kidney stones using a beam of light (laser)  or shock waves. Surgery to remove the kidney stones. Follow these instructions at home: Medicines Take over-the-counter and prescription medicines only as told by your doctor. Ask your doctor if the medicine prescribed to you requires you to avoid driving or using machinery. Eating and drinking Drink enough fluid to keep your pee pale yellow. You may be told to drink at least 8-10 glasses of water each day. This will help you pass the stone. If told by your doctor, change your diet. You may be told to: Limit how much salt you eat. Eat more fruits and vegetables. Limit how much meat, poultry, fish, and eggs you eat. Follow instructions from your doctor about what you may eat and drink. General instructions Collect pee samples as told by your doctor. You may need to collect a pee sample: 24 hours after a stone comes out. 8-12 weeks after a stone comes out, and every 6-12 months after that. Strain your pee every time you pee. Use the strainer that your doctor recommends. Do not throw out the stone. Keep it so that it can be tested by your doctor. Keep all follow-up visits. You may need X-rays and ultrasounds to make sure the stone has come out. How is this prevented? To prevent another kidney stone: Drink enough fluid to keep your pee pale yellow. This is the best way to prevent kidney stones. Eat healthy foods. Avoid certain foods as told by your doctor. You  may be told to eat less protein. Stay at a healthy weight. Where to find more information National Kidney Foundation (NKF): kidney.org [facility]): urologyhealth.org Contact a doctor if: You have pain that gets worse or does not get better with medicine. Get help right away if: You have a fever or chills. You get very bad pain. You get new pain in your belly. You faint. You cannot pee. This information is not intended to replace advice given to you by your health care provider. Make sure you discuss any  questions you have with your health care provider. Document Revised: 09/17/2021 Document Reviewed: 09/17/2021 Elsevier Patient Education  2024 [employer].

## 2023-01-13 ENCOUNTER — Encounter (HOSPITAL_BASED_OUTPATIENT_CLINIC_OR_DEPARTMENT_OTHER): Payer: Self-pay

## 2023-01-13 ENCOUNTER — Other Ambulatory Visit: Payer: Self-pay

## 2023-01-13 ENCOUNTER — Emergency Department (HOSPITAL_BASED_OUTPATIENT_CLINIC_OR_DEPARTMENT_OTHER): Payer: PPO

## 2023-01-13 ENCOUNTER — Emergency Department (HOSPITAL_BASED_OUTPATIENT_CLINIC_OR_DEPARTMENT_OTHER)
Admission: EM | Admit: 2023-01-13 | Discharge: 2023-01-13 | Disposition: A | Discharge status: Home / Self Care | Payer: PPO | Attending: Emergency Medicine | Admitting: Emergency Medicine

## 2023-01-13 DIAGNOSIS — R1031 Right lower quadrant pain: Secondary | ICD-10-CM

## 2023-01-13 DIAGNOSIS — R1030 Lower abdominal pain, unspecified: Secondary | ICD-10-CM | POA: Insufficient documentation

## 2023-01-13 DIAGNOSIS — Z7982 Long term (current) use of aspirin: Secondary | ICD-10-CM | POA: Insufficient documentation

## 2023-01-13 DIAGNOSIS — R0602 Shortness of breath: Secondary | ICD-10-CM | POA: Diagnosis not present

## 2023-01-13 DIAGNOSIS — R079 Chest pain, unspecified: Secondary | ICD-10-CM | POA: Diagnosis not present

## 2023-01-13 DIAGNOSIS — K5730 Diverticulosis of large intestine without perforation or abscess without bleeding: Secondary | ICD-10-CM | POA: Diagnosis not present

## 2023-01-13 DIAGNOSIS — K449 Diaphragmatic hernia without obstruction or gangrene: Secondary | ICD-10-CM | POA: Diagnosis not present

## 2023-01-13 DIAGNOSIS — R109 Unspecified abdominal pain: Secondary | ICD-10-CM | POA: Diagnosis present

## 2023-01-13 DIAGNOSIS — N200 Calculus of kidney: Secondary | ICD-10-CM | POA: Diagnosis not present

## 2023-01-13 LAB — URINALYSIS, ROUTINE W REFLEX MICROSCOPIC
Bacteria, UA: NONE SEEN
Bilirubin Urine: NEGATIVE
Glucose, UA: NEGATIVE mg/dL
Hgb urine dipstick: NEGATIVE
Ketones, ur: 15 mg/dL — AB
Leukocytes,Ua: NEGATIVE
Nitrite: NEGATIVE
Protein, ur: NEGATIVE mg/dL
Specific Gravity, Urine: >1.046 — ABNORMAL HIGH (ref 1.005–1.030)
pH: 7.5 (ref 5.0–8.0)

## 2023-01-13 LAB — CBC WITH DIFFERENTIAL/PLATELET
Abs Immature Granulocytes: 0.03 10*3/uL (ref 0.00–0.07)
Basophils Absolute: 0.1 10*3/uL (ref 0.0–0.1)
Basophils Relative: 0 %
Eosinophils Absolute: 0 10*3/uL (ref 0.0–0.5)
Eosinophils Relative: 0 %
HCT: 43.4 % (ref 39.0–52.0)
Hemoglobin: 15.1 g/dL (ref 13.0–17.0)
Immature Granulocytes: 0 %
Lymphocytes Relative: 8 %
Lymphs Abs: 1 10*3/uL (ref 0.7–4.0)
MCH: 30.8 pg (ref 26.0–34.0)
MCHC: 34.8 g/dL (ref 30.0–36.0)
MCV: 88.4 fL (ref 80.0–100.0)
Monocytes Absolute: 0.6 10*3/uL (ref 0.1–1.0)
Monocytes Relative: 6 %
Neutro Abs: 9.9 10*3/uL — ABNORMAL HIGH (ref 1.7–7.7)
Neutrophils Relative %: 86 %
Platelets: 295 10*3/uL (ref 150–400)
RBC: 4.91 MIL/uL (ref 4.22–5.81)
RDW: 13.8 % (ref 11.5–15.5)
WBC: 11.6 10*3/uL — ABNORMAL HIGH (ref 4.0–10.5)
nRBC: 0 % (ref 0.0–0.2)

## 2023-01-13 LAB — COMPREHENSIVE METABOLIC PANEL
ALT: 11 U/L (ref 0–44)
AST: 13 U/L — ABNORMAL LOW (ref 15–41)
Albumin: 4.4 g/dL (ref 3.5–5.0)
Alkaline Phosphatase: 47 U/L (ref 38–126)
Anion gap: 11 (ref 5–15)
BUN: 23 mg/dL (ref 8–23)
CO2: 22 mmol/L (ref 22–32)
Calcium: 9.3 mg/dL (ref 8.9–10.3)
Chloride: 106 mmol/L (ref 98–111)
Creatinine, Ser: 1.25 mg/dL — ABNORMAL HIGH (ref 0.61–1.24)
GFR, Estimated: >60 mL/min (ref >60)
Glucose, Bld: 111 mg/dL — ABNORMAL HIGH (ref 70–99)
Potassium: 3.8 mmol/L (ref 3.5–5.1)
Sodium: 139 mmol/L (ref 135–145)
Total Bilirubin: 0.9 mg/dL (ref <1.2)
Total Protein: 7 g/dL (ref 6.5–8.1)

## 2023-01-13 LAB — TROPONIN I (HIGH SENSITIVITY): Troponin I (High Sensitivity): 3 ng/L (ref <18)

## 2023-01-13 LAB — LIPASE, BLOOD: Lipase: <10 U/L — ABNORMAL LOW (ref 11–51)

## 2023-01-13 MED ORDER — ALUM & MAG HYDROXIDE-SIMETH 200-200-20 MG/5ML PO SUSP
30.0000 mL | Freq: Once | ORAL | Status: AC
  Administered 2023-01-13: 30 mL via ORAL
  Filled 2023-01-13: qty 30

## 2023-01-13 MED ORDER — OXYCODONE-ACETAMINOPHEN 5-325 MG PO TABS: 1.0000 | ORAL_TABLET | Freq: Four times a day (QID) | ORAL | 0 refills | Status: AC | PRN

## 2023-01-13 MED ORDER — MORPHINE SULFATE (PF) 4 MG/ML IV SOLN
4.0000 mg | Freq: Once | INTRAVENOUS | Status: AC
  Administered 2023-01-13: 4 mg via INTRAVENOUS
  Filled 2023-01-13: qty 1

## 2023-01-13 MED ORDER — ONDANSETRON HCL 4 MG/2ML IJ SOLN
4.0000 mg | Freq: Once | INTRAMUSCULAR | Status: AC
  Administered 2023-01-13: 4 mg via INTRAVENOUS
  Filled 2023-01-13: qty 2

## 2023-01-13 MED ORDER — ONDANSETRON HCL 4 MG PO TABS: 4.0000 mg | ORAL_TABLET | Freq: Four times a day (QID) | ORAL | 0 refills | Status: DC

## 2023-01-13 MED ORDER — IOHEXOL 300 MG/ML  SOLN
100.0000 mL | Freq: Once | INTRAMUSCULAR | Status: AC | PRN
  Administered 2023-01-13: 100 mL via INTRAVENOUS

## 2023-01-13 MED ORDER — POLYETHYLENE GLYCOL 3350 17 GM/SCOOP PO POWD: 1.0000 | Freq: Once | ORAL | 0 refills | Status: AC

## 2023-01-13 MED ORDER — OXYCODONE-ACETAMINOPHEN 5-325 MG PO TABS
1.0000 | ORAL_TABLET | Freq: Once | ORAL | Status: AC
  Administered 2023-01-13: 1 via ORAL
  Filled 2023-01-13: qty 1

## 2023-01-13 NOTE — ED Provider Notes (Signed)
Received handout pending CT scan  Reports feeling improved after pain medications.  Physical Exam  BP 135/88   Pulse 79   Temp 99.1 F (37.3 C) (Oral)   Resp (!) 21   Ht 5\' 10"  (1.778 m)   Wt 88.1 kg   SpO2 100%   BMI 27.86 kg/m   Physical Exam Vitals reviewed.  HENT:     Head: Normocephalic.  Cardiovascular:     Rate and Rhythm: Normal rate and regular rhythm.  Pulmonary:     Effort: Pulmonary effort is normal.  Abdominal:     Palpations: Abdomen is soft.     Tenderness: There is no abdominal tenderness.  Musculoskeletal:        General: Normal range of motion.     Cervical back: Normal range of motion.  Skin:    General: Skin is warm and dry.     Capillary Refill: Capillary refill takes less than 2 seconds.  Neurological:     Mental Status: He is alert and oriented to person, place, and time.  Psychiatric:        Mood and Affect: Mood normal.        Behavior: Behavior normal.     Procedures  Procedures  ED Course / MDM   Clinical Course as of 01/13/23 1628  [facility] Jan 13, 2023  1513 CT ABDOMEN PELVIS W CONTRAST [TY]    Clinical Course User Index [TY] Coral Spikes, DO   Medical Decision Making Received signout fromReceived signout; See Morning team note for full HPI. AFVSS. Benign abd. Labs re-assuring. CT scan without acute findings. Does appear to have some moderate stool; discussed bowel regimine. He also has follow up with his PCP and surgeon. Will discharge at this time.   Amount and/or Complexity of Data Reviewed Labs: ordered. Radiology: ordered. Decision-making details documented in ED Course. ECG/medicine tests: ordered.  Risk OTC drugs. Prescription drug management.         Coral Spikes, DO 01/13/23 1628

## 2023-01-13 NOTE — ED Provider Notes (Signed)
[place] EMERGENCY DEPARTMENT AT [facility] Provider Note   CSN: [id number] Arrival date & time: 01/13/23  1038     History  Chief Complaint  Patient presents with   Shortness of Breath   Abdominal Pain    [name] is a 67 y.o. male.  51 yoM with a chief complaint of right sided discomfort.  He says he feels like there is pain or pulling there.  It is reminiscent of when he had had a hernia in the past.  He actually was seen 3 days ago for the same had CT imaging that was negative and was discharged home.  Since then he said he has not been doing well and feels like the pain is getting worse he has been feeling cold all over and has had some nausea.  He also has developed some burning to his chest.  He does not think this feels like when he had a heart attack in the past but he is worried about it.  He denies cough denies fever.  Pain seems to be worse with movement standing ambulation.   Shortness of Breath Associated symptoms: abdominal pain   Abdominal Pain Associated symptoms: shortness of breath        Home Medications Prior to Admission medications   Medication Sig Start Date End Date Taking? Authorizing Provider  aspirin EC 81 MG tablet 81 mg daily.    Yes [provider]  atorvastatin (LIPITOR) 80 MG tablet TAKE 1 TABLET BY MOUTH EVERY DAY FOR CHOLESTEROL 08/08/22  Yes Cranford, Tonya, NP  Cholecalciferol (VITAMIN D) 125 MCG (5000 UT) CAPS Take 1 capsule by mouth daily. 10,000 units   Yes [provider]  esomeprazole (NEXIUM) 20 MG capsule Take 20 mg by mouth daily at 12 noon. Takes OTC 20 mg   Yes [provider]  finasteride (PROSCAR) 5 MG tablet TAKE 1 TABLE TDAILY FOR PROSTATE Patient taking differently: Take .5 table tDaily for Prostate 12/23/21  Yes Raynelle Dick, NP  HYDROcodone-acetaminophen (NORCO) 5-325 MG tablet Take 1 tablet by mouth every 6 (six) hours as needed. 01/11/23  Yes Cranford, Archie Patten, NP  hydrOXYzine  (ATARAX) 25 MG tablet Take  1 to 2 tablets  1 hour  before Bedtime  as needed for Sleep 03/05/21  Yes Lucky Cowboy, MD  lisinopril (ZESTRIL) 10 MG tablet TAKE 1 TABLET BY MOUTH DAILY FOR BLOOD PRESSURE 10/13/22  Yes Raynelle Dick, NP  MAGNESIUM PO Take by mouth.   Yes [provider]  meloxicam (MOBIC) 7.5 MG tablet Take  1 tablet  Daily  with Food for Pain & Inflammation                                                    /                                                             Take                        by  mouth 01/10/22  Yes Lucky Cowboy, MD  sildenafil (REVATIO) 20 MG tablet TAKE 1 TO 5 TABS BY MOUTH DAILY AS NEEDED. 10/14/16  Yes Lucky Cowboy, MD  traZODone (DESYREL) 150 MG tablet Take 1/2 to 1 or 2 tablets    1 to 2 hours     before Bedtime as needed for Sleep 10/24/22   Lucky Cowboy, MD      Allergies    Penicillins    Review of Systems   Review of Systems  Respiratory:  Positive for shortness of breath.   Gastrointestinal:  Positive for abdominal pain.    Physical Exam Updated Vital Signs BP 135/88   Pulse 79   Temp 99.1 F (37.3 C) (Oral)   Resp (!) 21   Ht 5\' 10"  (1.778 m)   Wt 88.1 kg   SpO2 100%   BMI 27.86 kg/m  Physical Exam Vitals and nursing note reviewed.  Constitutional:      Appearance: He is well-developed.  HENT:     Head: Normocephalic and atraumatic.  Eyes:     Pupils: Pupils are equal, round, and reactive to light.  Neck:     Vascular: No JVD.  Cardiovascular:     Rate and Rhythm: Normal rate and regular rhythm.     Heart sounds: No murmur heard.    No friction rub. No gallop.  Pulmonary:     Effort: No respiratory distress.     Breath sounds: No wheezing.  Abdominal:     General: There is no distension.     Tenderness: There is no abdominal tenderness. There is no guarding or rebound.     Comments: I do not appreciate any obvious abdominal discomfort bulges or masses.  He seems to have  pain along the attachment of the hip adductor to the pubic symphysis on the right.  I do not appreciate any mass in the inguinal canal.  Pulse motor and sensation are intact to the right lower extremity.  Reflexes are 3+ and equal bilaterally.  No obvious midline spinal tenderness.  No obvious CVA tenderness.  Genitourinary:    Comments: Circumcised, previous red reflex intact.  No obvious testicular tenderness. Musculoskeletal:        General: Normal range of motion.     Cervical back: Normal range of motion and neck supple.  Skin:    Coloration: Skin is not pale.     Findings: No rash.  Neurological:     Mental Status: He is alert and oriented to person, place, and time.  Psychiatric:        Behavior: Behavior normal.     ED Results / Procedures / Treatments   Labs (all labs ordered are listed, but only abnormal results are displayed) Labs Reviewed  CBC WITH DIFFERENTIAL/PLATELET - Abnormal; Notable for the following components:      Result Value   WBC 11.6 (*)    Neutro Abs 9.9 (*)    All other components within normal limits  COMPREHENSIVE METABOLIC PANEL - Abnormal; Notable for the following components:   Glucose, Bld 111 (*)    Creatinine, Ser 1.25 (*)    AST 13 (*)    All other components within normal limits  LIPASE, BLOOD - Abnormal; Notable for the following components:   Lipase <10 (*)    All other components within normal limits  URINALYSIS, ROUTINE W REFLEX MICROSCOPIC - Abnormal; Notable for the following components:   Specific Gravity, Urine >1.046 (*)    Ketones, ur 15 (*)  All other components within normal limits  TROPONIN I (HIGH SENSITIVITY)    EKG EKG Interpretation Date/Time:  Friday January 13 2023 11:25:58 EST Ventricular Rate:  75 PR Interval:  154 QRS Duration:  88 QT Interval:  372 QTC Calculation: 416 R Axis:   55  Text Interpretation: Sinus rhythm Abnormal R-wave progression, early transition No significant change since last tracing  Confirmed by Melene Plan [phone]) on 01/13/2023 1:10:22 PM  Radiology CT ABDOMEN PELVIS W CONTRAST  Result Date: 01/13/2023 CLINICAL DATA:  Right lower quadrant abdominal pain. Shortness of breath. Vomiting. EXAM: CT ABDOMEN AND PELVIS WITH CONTRAST TECHNIQUE: Multidetector CT imaging of the abdomen and pelvis was performed using the standard protocol following bolus administration of intravenous contrast. RADIATION DOSE REDUCTION: This exam was performed according to the departmental dose-optimization program which includes automated exposure control, adjustment of the mA and/or kV according to patient size and/or use of iterative reconstruction technique. CONTRAST:  OMNIPAQUE IOHEXOL 300 MG/ML  SOLN COMPARISON:  01/10/2023 FINDINGS: Lower chest: Right coronary artery atheromatous vascular calcification. Small type 1 hiatal hernia. Hepatobiliary: Unremarkable Pancreas: Unremarkable Spleen: Unremarkable Adrenals/Urinary Tract: Small right peripelvic cysts. No further imaging workup of these lesions is indicated. 2 mm right kidney upper pole nonobstructive renal calculus. No other urinary tract calculi observed. Stomach/Bowel: Sigmoid colon diverticulosis without definite active diverticulitis. Normal appendix and terminal ileum. Otherwise unremarkable. Vascular/Lymphatic: Mild aortoiliac atheromatous vascular calcifications. Reproductive: The prostate gland measures 4.3 by 4.3 by 4.1 cm (volume = 40 cm^3), compatible with mild prostatomegaly. Other: No supplemental non-categorized findings. Musculoskeletal: Anterior pelvic hernia mesh extending along both groin regions. IMPRESSION: 1. No acute findings. 2. 2 mm right kidney upper pole nonobstructive renal calculus. 3. Sigmoid colon diverticulosis without definite active diverticulitis. 4. Mild prostatomegaly. 5. Small type 1 hiatal hernia. 6. Right coronary artery atheromatous vascular calcification. 7. Anterior pelvic hernia mesh extending along both groin  regions. 8. Aortic atherosclerosis. Aortic Atherosclerosis (ICD10-I70.0). Electronically Signed   By: Gaylyn Rong M.D.   On: 01/13/2023 15:04   DG Chest Port 1 View  Result Date: 01/13/2023 CLINICAL DATA:  Shortness of breath and chest pain EXAM: PORTABLE CHEST 1 VIEW COMPARISON:  Chest radiograph dated 12/29/2020 FINDINGS: Normal lung volumes. No focal consolidations. No pleural effusion or pneumothorax. The heart size and mediastinal contours are within normal limits. No acute osseous abnormality. IMPRESSION: No active disease. Electronically Signed   By: Agustin Cree M.D.   On: 01/13/2023 11:40    Procedures Procedures    Medications Ordered in ED Medications  morphine (PF) 4 MG/ML injection 4 mg (4 mg Intravenous Given 01/13/23 1126)  ondansetron (ZOFRAN) injection 4 mg (4 mg Intravenous Given 01/13/23 1125)  alum & mag hydroxide-simeth (MAALOX/MYLANTA) 200-200-20 MG/5ML suspension 30 mL (30 mLs Oral Given 01/13/23 1126)  iohexol (OMNIPAQUE) 300 MG/ML solution 100 mL (100 mLs Intravenous Contrast Given 01/13/23 1237)  morphine (PF) 4 MG/ML injection 4 mg (4 mg Intravenous Given 01/13/23 1449)  ondansetron (ZOFRAN) injection 4 mg (4 mg Intravenous Given 01/13/23 1449)    ED Course/ Medical Decision Making/ A&P Clinical Course as of 01/13/23 1610  Fri Jan 13, 2023  1513 CT ABDOMEN PELVIS W CONTRAST [TY]    Clinical Course User Index [TY] Coral Spikes, DO                                 Medical Decision Making Amount and/or Complexity of Data Reviewed  Labs: ordered. Radiology: ordered. ECG/medicine tests: ordered.  Risk OTC drugs. Prescription drug management.   67 yo M with a chief complaints of right-sided groin pain.  This has been going on for about 4 days now.  He was seen in the emergency department at the onset of his symptoms had a CT stone study and labs and a urine.  All seemed unremarkable and he was treated as musculoskeletal pain.  He has not had much  improvement.  It sounds like he stopped taking all of his normal medications.  Now has some burning chest discomfort as well.  Will obtain a laboratory evaluation here.  I will do a CT scan today with contrast.  I think is unlikely that he is having an MI but will obtain an EKG chest x-ray and troponin.  Trop negative.  CXR independently interpreted by me without focal infiltrate or ptx.  UA without infection.   Mild leukocytosis, LFT and lipase unremarkable.  Awaiting CT read.  Signed out to Dr. Maple Hudson, please see their note for further details of care in the ED.  The patients results and plan were reviewed and discussed.   Any x-rays performed were independently reviewed by myself.   Differential diagnosis were considered with the presenting HPI.  Medications  morphine (PF) 4 MG/ML injection 4 mg (4 mg Intravenous Given 01/13/23 1126)  ondansetron (ZOFRAN) injection 4 mg (4 mg Intravenous Given 01/13/23 1125)  alum & mag hydroxide-simeth (MAALOX/MYLANTA) 200-200-20 MG/5ML suspension 30 mL (30 mLs Oral Given 01/13/23 1126)  iohexol (OMNIPAQUE) 300 MG/ML solution 100 mL (100 mLs Intravenous Contrast Given 01/13/23 1237)  morphine (PF) 4 MG/ML injection 4 mg (4 mg Intravenous Given 01/13/23 1449)  ondansetron (ZOFRAN) injection 4 mg (4 mg Intravenous Given 01/13/23 1449)    Vitals:   01/13/23 1315 01/13/23 1515 01/13/23 1530 01/13/23 1544  BP:  (!) 155/88 135/88   Pulse: 66 82 79   Resp: 15 14 (!) 21   Temp:    99.1 F (37.3 C)  TempSrc:    Oral  SpO2: 100% 100% 100%   Weight:      Height:        Final diagnoses:  Right groin pain           Final Clinical Impression(s) / ED Diagnoses Final diagnoses:  Right groin pain    Rx / DC Orders ED Discharge Orders     None         Melene Plan, DO 01/13/23 1610

## 2023-01-13 NOTE — Discharge Instructions (Addendum)
Please follow up with your surgeon and primary doctor. Return if you develop fevers, chills worsening abdominal pain, stop having bowel movements or uncontrolled nausea and vomiting. You may also return if you develop any new or worsening symptoms.

## 2023-01-13 NOTE — ED Triage Notes (Signed)
In for eval of SOB with abd pain, nausea, vomiting, right groin pain, chills, burning indigestion chest pain. Was seen in ED on 01/10/2023, diag with flank pain.   Reports not taking any home medications (2-3 days) while he has been sick including his Nexium for acid reflux.

## 2023-03-29 ENCOUNTER — Encounter: Payer: PPO | Admitting: Internal Medicine

## 2023-04-26 ENCOUNTER — Encounter: Payer: PPO | Admitting: Internal Medicine

## 2023-07-25 ENCOUNTER — Ambulatory Visit: Payer: PPO | Admitting: Nurse Practitioner

## 2023-07-30 ENCOUNTER — Emergency Department (HOSPITAL_BASED_OUTPATIENT_CLINIC_OR_DEPARTMENT_OTHER): Admission: EM | Admit: 2023-07-30 | Discharge: 2023-07-31 | Disposition: A | Discharge status: Home / Self Care

## 2023-07-30 ENCOUNTER — Encounter (HOSPITAL_BASED_OUTPATIENT_CLINIC_OR_DEPARTMENT_OTHER): Payer: Self-pay | Admitting: Emergency Medicine

## 2023-07-30 ENCOUNTER — Other Ambulatory Visit: Payer: Self-pay

## 2023-07-30 ENCOUNTER — Emergency Department (HOSPITAL_BASED_OUTPATIENT_CLINIC_OR_DEPARTMENT_OTHER)

## 2023-07-30 DIAGNOSIS — R079 Chest pain, unspecified: Secondary | ICD-10-CM | POA: Insufficient documentation

## 2023-07-30 DIAGNOSIS — Z86718 Personal history of other venous thrombosis and embolism: Secondary | ICD-10-CM | POA: Insufficient documentation

## 2023-07-30 DIAGNOSIS — Z7982 Long term (current) use of aspirin: Secondary | ICD-10-CM | POA: Insufficient documentation

## 2023-07-30 DIAGNOSIS — R0602 Shortness of breath: Secondary | ICD-10-CM | POA: Diagnosis present

## 2023-07-30 LAB — RESP PANEL BY RT-PCR (RSV, FLU A&B, COVID)  RVPGX2
Influenza A by PCR: NEGATIVE
Influenza B by PCR: NEGATIVE
Resp Syncytial Virus by PCR: NEGATIVE
SARS Coronavirus 2 by RT PCR: NEGATIVE

## 2023-07-30 LAB — TROPONIN T, HIGH SENSITIVITY
Troponin T High Sensitivity: 21 ng/L — ABNORMAL HIGH (ref <19)
Troponin T High Sensitivity: <15 ng/L (ref <19)

## 2023-07-30 LAB — CBC
HCT: 40.1 % (ref 39.0–52.0)
Hemoglobin: 13.9 g/dL (ref 13.0–17.0)
MCH: 30.9 pg (ref 26.0–34.0)
MCHC: 34.7 g/dL (ref 30.0–36.0)
MCV: 89.1 fL (ref 80.0–100.0)
Platelets: 283 10*3/uL (ref 150–400)
RBC: 4.5 MIL/uL (ref 4.22–5.81)
RDW: 13.9 % (ref 11.5–15.5)
WBC: 8.6 10*3/uL (ref 4.0–10.5)
nRBC: 0 % (ref 0.0–0.2)

## 2023-07-30 LAB — BASIC METABOLIC PANEL WITH GFR
Anion gap: 16 — ABNORMAL HIGH (ref 5–15)
BUN: 23 mg/dL (ref 8–23)
CO2: 17 mmol/L — ABNORMAL LOW (ref 22–32)
Calcium: 9.2 mg/dL (ref 8.9–10.3)
Chloride: 107 mmol/L (ref 98–111)
Creatinine, Ser: 1.37 mg/dL — ABNORMAL HIGH (ref 0.61–1.24)
GFR, Estimated: 56 mL/min — ABNORMAL LOW (ref >60)
Glucose, Bld: 106 mg/dL — ABNORMAL HIGH (ref 70–99)
Potassium: 3.8 mmol/L (ref 3.5–5.1)
Sodium: 140 mmol/L (ref 135–145)

## 2023-07-30 LAB — D-DIMER, QUANTITATIVE: D-Dimer, Quant: <0.27 ug{FEU}/mL (ref 0.00–0.50)

## 2023-07-30 MED ORDER — LACTATED RINGERS IV BOLUS
1000.0000 mL | Freq: Once | INTRAVENOUS | Status: AC
  Administered 2023-07-30: 1000 mL via INTRAVENOUS

## 2023-07-30 MED ORDER — ONDANSETRON HCL 4 MG/2ML IJ SOLN
4.0000 mg | Freq: Once | INTRAMUSCULAR | Status: AC
  Administered 2023-07-30: 4 mg via INTRAVENOUS
  Filled 2023-07-30: qty 2

## 2023-07-30 MED ORDER — LACTATED RINGERS IV BOLUS: 500.0000 mL | Freq: Once | INTRAVENOUS | Status: DC

## 2023-07-30 NOTE — ED Provider Notes (Signed)
 Care of patient received from prior provider at 11:35 PM, please see their note for complete H/P and care plan.  Received handoff per ED course.  Clinical Course as of 07/30/23 2336  Sun Jul 30, 2023  2313 D-Dimer, Quant: <0.27 [TY]  2335 Stable 67 YOM with atypical chest pain while mowing. Came back again tonight. Also having cough, congestion, nausea and vomitting.  Serial troponins pending. [CC]    Clinical Course User Index [CC] Jerral Meth, MD [TY] Neysa Caron PARAS, DO    Reassessment: Patient observed in the ER for a total of 4 hours.  Chest pain remained completely resolved on my serial assessments. Shared medical decision making with the patient.  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 Told the patient that the safest option would be to have him admitted to a medical service for observation and expedited cardiology evaluation tomorrow.  Patient declined.  He states that he feels very comfortable in the outpatient setting following up with his normal cardiology team.  First referral to cardiology placed given his prior delays in cardiology evaluation. Patient is adamant on discharge at this time states that symptoms are completely resolved.  Serial troponin downtrending. Patient expressed risk of missed disease but feels comfortable with outpatient care management.  Disposition:  Patient is requesting discharge at this time.  Given patient's understanding of risk of severe missed diagnosis based on limitations of today's evaluation and risk of interval worsening of disease including life or limb threatening pathology, will participate in shared medical decision making and patient directed discharge at this time.  Patient is welcome to return for further diagnostic evaluation/therapeutic management at any time.      Jerral Meth, MD 07/31/23 [phone]

## 2023-07-30 NOTE — ED Triage Notes (Signed)
 Sob, chest pain (points to left side of chest) It just kinda started Just got home from the beach and mowed the yard in the heat  I think I over did it  Constipation Last BM Thursday  Took trazodone  PTA

## 2023-07-31 NOTE — ED Provider Notes (Signed)
 [place] EMERGENCY DEPARTMENT AT [facility] Provider Note   CSN: [id number] Arrival date & time: 07/30/23  2106     Patient presents with: Shortness of Breath and Chest Pain   [name] is a 68 y.o. male.  {Add pertinent medical, surgical, social history, OB history to HPI:32947} Chest pain/SHOB   Shortness of Breath Associated symptoms: chest pain   Chest Pain Associated symptoms: shortness of breath        Prior to Admission medications   Medication Sig Start Date End Date Taking? Authorizing Provider  aspirin  EC 81 MG tablet 81 mg daily.     [provider]  atorvastatin  (LIPITOR) 80 MG tablet TAKE 1 TABLET BY MOUTH EVERY DAY FOR CHOLESTEROL 08/08/22   Cranford, Tonya, NP  Cholecalciferol (VITAMIN D ) 125 MCG (5000 UT) CAPS Take 1 capsule by mouth daily. 10,000 units    [provider]  esomeprazole  (NEXIUM ) 20 MG capsule Take 20 mg by mouth daily at 12 noon. Takes OTC 20 mg    [provider]  finasteride  (PROSCAR ) 5 MG tablet TAKE 1 TABLE TDAILY FOR PROSTATE Patient taking differently: Take .5 table tDaily for Prostate 12/23/21   Wilkinson, Dana E, FNP  HYDROcodone -acetaminophen  (NORCO) 5-325 MG tablet Take 1 tablet by mouth every 6 (six) hours as needed. 01/11/23   Cranford, Tonya, NP  hydrOXYzine  (ATARAX ) 25 MG tablet Take  1 to 2 tablets  1 hour  before Bedtime  as needed for Sleep 03/05/21   Tonita Fallow, MD  lisinopril  (ZESTRIL ) 10 MG tablet TAKE 1 TABLET BY MOUTH DAILY FOR BLOOD PRESSURE 10/13/22   Wilkinson, Dana E, FNP  MAGNESIUM  PO Take by mouth.    [provider]  meloxicam  (MOBIC ) 7.5 MG tablet Take  1 tablet  Daily  with Food for Pain & Inflammation                                                    /                                                             Take                        by                                mouth 01/10/22   Tonita Fallow, MD  ondansetron  (ZOFRAN ) 4 MG tablet Take 1 tablet (4 mg  total) by mouth every 6 (six) hours. 01/13/23   Neysa Caron PARAS, DO  sildenafil  (REVATIO ) 20 MG tablet TAKE 1 TO 5 TABS BY MOUTH DAILY AS NEEDED. 10/14/16   Tonita Fallow, MD  traZODone  (DESYREL ) 150 MG tablet Take 1/2 to 1 or 2 tablets    1 to 2 hours     before Bedtime as needed for Sleep 10/24/22   Tonita Fallow, MD    Allergies: Penicillins    Review of Systems  Respiratory:  Positive for shortness of breath.   Cardiovascular:  Positive for chest pain.  Updated Vital Signs BP 103/68   Pulse 78   Temp 97.6 F (36.4 C) (Oral)   Resp 16   Ht 5' 10 (1.778 m)   Wt 86.2 kg   SpO2 96%   BMI 27.26 kg/m   Physical Exam  (all labs ordered are listed, but only abnormal results are displayed) Labs Reviewed  BASIC METABOLIC PANEL WITH GFR - Abnormal; Notable for the following components:      Result Value   CO2 17 (*)    Glucose, Bld 106 (*)    Creatinine, Ser 1.37 (*)    GFR, Estimated 56 (*)    Anion gap 16 (*)    All other components within normal limits  TROPONIN T, HIGH SENSITIVITY - Abnormal; Notable for the following components:   Troponin T High Sensitivity 21 (*)    All other components within normal limits  RESP PANEL BY RT-PCR (RSV, FLU A&B, COVID)  RVPGX2  CBC  D-DIMER, QUANTITATIVE  TROPONIN T, HIGH SENSITIVITY    EKG: EKG Interpretation Date/Time:  "Sunday July 30 2023 21:14:05 EDT Ventricular Rate:  57 PR Interval:  148 QRS Duration:  88 QT Interval:  412 QTC Calculation: 401 R Axis:   68  Text Interpretation: Sinus bradycardia Otherwise normal ECG When compared with ECG of 13-Jan-2023 11:25, PREVIOUS ECG IS PRESENT No significant change since last tracing Confirmed by Haviland, Julie ([id number]) on 07/31/2023 9:43:14 AM  Radiology: DG Chest Port 1 View Result Date: 07/30/2023 CLINICAL DATA:  Shortness of breath EXAM: PORTABLE CHEST 1 VIEW COMPARISON:  Chest x-ray 01/13/2023 FINDINGS: The heart size and mediastinal contours are within normal limits.  Both lungs are clear. The visualized skeletal structures are unremarkable. IMPRESSION: No active disease. Electronically Signed   By: Amy  Guttmann M.D.   On: 07/30/2023 21:34    {Document cardiac monitor, telemetry assessment procedure when appropriate:32947} Procedures   Medications Ordered in the ED  ondansetron (ZOFRAN) injection 4 mg (4 mg Intravenous Given 07/30/23 2142)  lactated ringers bolus 1,000 mL (0 mLs Intravenous Stopped 07/31/23 0111)    Clinical Course as of 07/31/23 1458  Sun Jul 30, 2023  2313 D-Dimer, Quant: <0.27 [TY]  2335 Stable 67 YOM with atypical chest pain while mowing. Came back again tonight. Also having cough, congestion, nausea and vomitting.  Serial troponins pending. [CC]    Clinical Course User Index [CC] Countryman, Chase, MD [TY] Azriella Mattia J, DO   {Click here for ABCD2, HEART and other calculators REFRESH Note before signing:1}                              Medical Decision Making Amount and/or Complexity of Data Reviewed Labs: ordered. Decision-making details documented in ED Course. Radiology: ordered.  Risk Prescription drug management.   ***  {Document critical care time when appropriate  Document review of labs and clinical decision tools ie CHADS2VASC2, etc  Document your independent review of radiology images and any outside records  Document your discussion with family members, caretakers and with consultants  Document social determinants of health affecting pt's care  Document your decision making why or why not admission, treatments were needed:32947:::1}   Final diagnoses:  Chest pain, unspecified type    ED Discharge Orders          Ordered    Ambulatory referral to Cardiology        06" /23/25 0107

## 2023-11-01 ENCOUNTER — Emergency Department (HOSPITAL_BASED_OUTPATIENT_CLINIC_OR_DEPARTMENT_OTHER)
Admission: EM | Admit: 2023-11-01 | Discharge: 2023-11-01 | Disposition: A | Discharge status: Home / Self Care | Source: Home / Self Care | Attending: Emergency Medicine | Admitting: Emergency Medicine

## 2023-11-01 ENCOUNTER — Other Ambulatory Visit: Payer: Self-pay

## 2023-11-01 ENCOUNTER — Emergency Department (HOSPITAL_BASED_OUTPATIENT_CLINIC_OR_DEPARTMENT_OTHER)

## 2023-11-01 ENCOUNTER — Emergency Department (HOSPITAL_BASED_OUTPATIENT_CLINIC_OR_DEPARTMENT_OTHER)
Admission: EM | Admit: 2023-11-01 | Discharge: 2023-11-01 | Disposition: A | Discharge status: Home / Self Care | Attending: Emergency Medicine | Admitting: Emergency Medicine

## 2023-11-01 DIAGNOSIS — D72829 Elevated white blood cell count, unspecified: Secondary | ICD-10-CM | POA: Insufficient documentation

## 2023-11-01 DIAGNOSIS — N200 Calculus of kidney: Secondary | ICD-10-CM | POA: Insufficient documentation

## 2023-11-01 DIAGNOSIS — I10 Essential (primary) hypertension: Secondary | ICD-10-CM | POA: Diagnosis not present

## 2023-11-01 DIAGNOSIS — R7989 Other specified abnormal findings of blood chemistry: Secondary | ICD-10-CM | POA: Insufficient documentation

## 2023-11-01 DIAGNOSIS — Z7982 Long term (current) use of aspirin: Secondary | ICD-10-CM | POA: Insufficient documentation

## 2023-11-01 DIAGNOSIS — N132 Hydronephrosis with renal and ureteral calculous obstruction: Secondary | ICD-10-CM | POA: Diagnosis not present

## 2023-11-01 DIAGNOSIS — Z79899 Other long term (current) drug therapy: Secondary | ICD-10-CM | POA: Diagnosis not present

## 2023-11-01 DIAGNOSIS — R109 Unspecified abdominal pain: Secondary | ICD-10-CM | POA: Diagnosis present

## 2023-11-01 DIAGNOSIS — R1031 Right lower quadrant pain: Secondary | ICD-10-CM | POA: Diagnosis present

## 2023-11-01 LAB — COMPREHENSIVE METABOLIC PANEL WITH GFR
ALT: 20 U/L (ref 0–44)
ALT: 19 U/L (ref 0–44)
AST: 21 U/L (ref 15–41)
AST: 21 U/L (ref 15–41)
Albumin: 4.6 g/dL (ref 3.5–5.0)
Albumin: 4.7 g/dL (ref 3.5–5.0)
Alkaline Phosphatase: 72 U/L (ref 38–126)
Alkaline Phosphatase: 76 U/L (ref 38–126)
Anion gap: 14 (ref 5–15)
Anion gap: 16 — ABNORMAL HIGH (ref 5–15)
BUN: 21 mg/dL (ref 8–23)
BUN: 24 mg/dL — ABNORMAL HIGH (ref 8–23)
CO2: 22 mmol/L (ref 22–32)
CO2: 20 mmol/L — ABNORMAL LOW (ref 22–32)
Calcium: 9.9 mg/dL (ref 8.9–10.3)
Calcium: 10.3 mg/dL (ref 8.9–10.3)
Chloride: 105 mmol/L (ref 98–111)
Chloride: 106 mmol/L (ref 98–111)
Creatinine, Ser: 1.42 mg/dL — ABNORMAL HIGH (ref 0.61–1.24)
Creatinine, Ser: 2.02 mg/dL — ABNORMAL HIGH (ref 0.61–1.24)
GFR, Estimated: 53 mL/min — ABNORMAL LOW (ref >60)
GFR, Estimated: 35 mL/min — ABNORMAL LOW (ref >60)
Glucose, Bld: 136 mg/dL — ABNORMAL HIGH (ref 70–99)
Glucose, Bld: 120 mg/dL — ABNORMAL HIGH (ref 70–99)
Potassium: 4.4 mmol/L (ref 3.5–5.1)
Potassium: 4.7 mmol/L (ref 3.5–5.1)
Sodium: 142 mmol/L (ref 135–145)
Sodium: 142 mmol/L (ref 135–145)
Total Bilirubin: 0.6 mg/dL (ref 0.0–1.2)
Total Bilirubin: 0.7 mg/dL (ref 0.0–1.2)
Total Protein: 7 g/dL (ref 6.5–8.1)
Total Protein: 7 g/dL (ref 6.5–8.1)

## 2023-11-01 LAB — URINALYSIS, ROUTINE W REFLEX MICROSCOPIC
Bacteria, UA: NONE SEEN
Bilirubin Urine: NEGATIVE
Glucose, UA: NEGATIVE mg/dL
Ketones, ur: 15 mg/dL — AB
Nitrite: NEGATIVE
Specific Gravity, Urine: 1.035 — ABNORMAL HIGH (ref 1.005–1.030)
pH: 5.5 (ref 5.0–8.0)

## 2023-11-01 LAB — CBC
HCT: 43.7 % (ref 39.0–52.0)
Hemoglobin: 14.8 g/dL (ref 13.0–17.0)
MCH: 30.5 pg (ref 26.0–34.0)
MCHC: 33.9 g/dL (ref 30.0–36.0)
MCV: 90.1 fL (ref 80.0–100.0)
Platelets: 268 K/uL (ref 150–400)
RBC: 4.85 MIL/uL (ref 4.22–5.81)
RDW: 13.7 % (ref 11.5–15.5)
WBC: 7.8 K/uL (ref 4.0–10.5)
nRBC: 0 % (ref 0.0–0.2)

## 2023-11-01 LAB — CBC WITH DIFFERENTIAL/PLATELET
Abs Immature Granulocytes: 0.06 K/uL (ref 0.00–0.07)
Basophils Absolute: 0.1 K/uL (ref 0.0–0.1)
Basophils Relative: 0 %
Eosinophils Absolute: 0 K/uL (ref 0.0–0.5)
Eosinophils Relative: 0 %
HCT: 41.8 % (ref 39.0–52.0)
Hemoglobin: 14.5 g/dL (ref 13.0–17.0)
Immature Granulocytes: 0 %
Lymphocytes Relative: 8 %
Lymphs Abs: 1.3 K/uL (ref 0.7–4.0)
MCH: 30.8 pg (ref 26.0–34.0)
MCHC: 34.7 g/dL (ref 30.0–36.0)
MCV: 88.7 fL (ref 80.0–100.0)
Monocytes Absolute: 1.1 K/uL — ABNORMAL HIGH (ref 0.1–1.0)
Monocytes Relative: 7 %
Neutro Abs: 13.5 K/uL — ABNORMAL HIGH (ref 1.7–7.7)
Neutrophils Relative %: 85 %
Platelets: 281 K/uL (ref 150–400)
RBC: 4.71 MIL/uL (ref 4.22–5.81)
RDW: 13.7 % (ref 11.5–15.5)
WBC: 15.9 K/uL — ABNORMAL HIGH (ref 4.0–10.5)
nRBC: 0 % (ref 0.0–0.2)

## 2023-11-01 MED ORDER — SODIUM CHLORIDE 0.9 % IV BOLUS
1000.0000 mL | Freq: Once | INTRAVENOUS | Status: AC
  Administered 2023-11-01: 1000 mL via INTRAVENOUS

## 2023-11-01 MED ORDER — OXYCODONE-ACETAMINOPHEN 5-325 MG PO TABS: 1.0000 | ORAL_TABLET | Freq: Four times a day (QID) | ORAL | 0 refills | Status: DC | PRN

## 2023-11-01 MED ORDER — HYDROMORPHONE HCL 1 MG/ML IJ SOLN
1.0000 mg | Freq: Once | INTRAMUSCULAR | Status: AC
  Administered 2023-11-01: 1 mg via INTRAVENOUS
  Filled 2023-11-01: qty 1

## 2023-11-01 MED ORDER — DIPHENHYDRAMINE HCL 50 MG/ML IJ SOLN
12.5000 mg | Freq: Once | INTRAMUSCULAR | Status: AC
Start: 2023-11-01 — End: 2023-11-01
  Administered 2023-11-01: 12.5 mg via INTRAVENOUS
  Filled 2023-11-01: qty 1

## 2023-11-01 MED ORDER — PROCHLORPERAZINE EDISYLATE 10 MG/2ML IJ SOLN
5.0000 mg | Freq: Once | INTRAMUSCULAR | Status: AC
  Administered 2023-11-01: 5 mg via INTRAVENOUS
  Filled 2023-11-01: qty 2

## 2023-11-01 MED ORDER — KETOROLAC TROMETHAMINE 15 MG/ML IJ SOLN
15.0000 mg | Freq: Once | INTRAMUSCULAR | Status: AC
  Administered 2023-11-01: 15 mg via INTRAVENOUS
  Filled 2023-11-01: qty 1

## 2023-11-01 MED ORDER — ONDANSETRON HCL 4 MG/2ML IJ SOLN
4.0000 mg | Freq: Once | INTRAMUSCULAR | Status: AC | PRN
  Administered 2023-11-01: 4 mg via INTRAVENOUS
  Filled 2023-11-01: qty 2

## 2023-11-01 MED ORDER — KETOROLAC TROMETHAMINE 30 MG/ML IJ SOLN
30.0000 mg | Freq: Once | INTRAMUSCULAR | Status: AC
  Administered 2023-11-01: 30 mg via INTRAVENOUS
  Filled 2023-11-01: qty 1

## 2023-11-01 MED ORDER — MORPHINE SULFATE (PF) 4 MG/ML IV SOLN
4.0000 mg | Freq: Once | INTRAVENOUS | Status: AC
  Administered 2023-11-01: 4 mg via INTRAVENOUS
  Filled 2023-11-01: qty 1

## 2023-11-01 MED ORDER — FENTANYL CITRATE PF 50 MCG/ML IJ SOSY
50.0000 ug | PREFILLED_SYRINGE | Freq: Once | INTRAMUSCULAR | Status: AC
  Administered 2023-11-01: 50 ug via INTRAVENOUS
  Filled 2023-11-01: qty 1

## 2023-11-01 MED ORDER — ONDANSETRON HCL 4 MG/2ML IJ SOLN
4.0000 mg | Freq: Once | INTRAMUSCULAR | Status: AC
  Administered 2023-11-01: 4 mg via INTRAVENOUS
  Filled 2023-11-01: qty 2

## 2023-11-01 MED ORDER — ONDANSETRON 4 MG PO TBDP: 4.0000 mg | ORAL_TABLET | Freq: Three times a day (TID) | ORAL | 0 refills | Status: DC | PRN

## 2023-11-01 NOTE — ED Notes (Signed)
 Patient insist on going home, patient educated about the risks of driving while under the influence of opioid medication.  Patient acknowledges risks and states that he lives five minutes away and will be fine.

## 2023-11-01 NOTE — ED Triage Notes (Signed)
 Pt POV reporting persistent R flank pain and vomiting, seen yesterday, dx kidney stone, unable to keep pain meds down.

## 2023-11-01 NOTE — Discharge Instructions (Signed)
 Begin taking Percocet as prescribed as needed for pain.  Follow-up with urology if symptoms are not improving in the next few days.  The contact information for [facility] has been provided in this discharge summary for you to call and make these arrangements.

## 2023-11-01 NOTE — ED Provider Notes (Signed)
 [place] EMERGENCY DEPARTMENT AT [facility] Provider Note   CSN: [id number] Arrival date & time: 11/01/23  1357     Patient presents with: Flank Pain   [name] is a 68 y.o. male.   Patient here with ongoing pain from kidney stone that he was diagnosed with this morning.  Has not been able to keep things down as he has been nauseous.  He was diagnosed with a right-sided kidney stone.  Denies any fevers or chills.  He states he has not been able to make urine since he has been home as well.  He has mostly been too nauseous to hold on his pain meds.  The history is provided by the patient.       Prior to Admission medications   Medication Sig Start Date End Date Taking? Authorizing Provider  aspirin  EC 81 MG tablet 81 mg daily.     [provider]  atorvastatin  (LIPITOR) 80 MG tablet TAKE 1 TABLET BY MOUTH EVERY DAY FOR CHOLESTEROL 08/08/22   Cranford, Tonya, NP  Cholecalciferol (VITAMIN D ) 125 MCG (5000 UT) CAPS Take 1 capsule by mouth daily. 10,000 units    [provider]  esomeprazole  (NEXIUM ) 20 MG capsule Take 20 mg by mouth daily at 12 noon. Takes OTC 20 mg    [provider]  finasteride  (PROSCAR ) 5 MG tablet TAKE 1 TABLE TDAILY FOR PROSTATE Patient taking differently: Take .5 table tDaily for Prostate 12/23/21   Wilkinson, Dana E, NP  HYDROcodone -acetaminophen  (NORCO) 5-325 MG tablet Take 1 tablet by mouth every 6 (six) hours as needed. 01/11/23   Cranford, Tonya, NP  hydrOXYzine  (ATARAX ) 25 MG tablet Take  1 to 2 tablets  1 hour  before Bedtime  as needed for Sleep 03/05/21   Tonita Fallow, MD  lisinopril  (ZESTRIL ) 10 MG tablet TAKE 1 TABLET BY MOUTH DAILY FOR BLOOD PRESSURE 10/13/22   Wilkinson, Dana E, NP  MAGNESIUM  PO Take by mouth.    [provider]  meloxicam  (MOBIC ) 7.5 MG tablet Take  1 tablet  Daily  with Food for Pain & Inflammation                                                    /                                                              Take                        by                                mouth 01/10/22   Tonita Fallow, MD  ondansetron  (ZOFRAN ) 4 MG tablet Take 1 tablet (4 mg total) by mouth every 6 (six) hours. 01/13/23   Neysa Caron PARAS, DO  oxyCODONE -acetaminophen  (PERCOCET) 5-325 MG tablet Take 1-2 tablets by mouth every 6 (six) hours as needed. 11/01/23   Geroldine Berg, MD  sildenafil  (REVATIO ) 20 MG tablet TAKE 1 TO 5 TABS BY MOUTH  DAILY AS NEEDED. 10/14/16   Tonita Fallow, MD  traZODone  (DESYREL ) 150 MG tablet Take 1/2 to 1 or 2 tablets    1 to 2 hours     before Bedtime as needed for Sleep 10/24/22   Tonita Fallow, MD    Allergies: Penicillins    Review of Systems  Updated Vital Signs BP (!) 144/81   Pulse 65   Temp 99 F (37.2 C) (Oral)   Resp 17   Ht 5' 10 (1.778 m)   Wt 86.2 kg   SpO2 100%   BMI 27.26 kg/m   Physical Exam Vitals and nursing note reviewed.  Constitutional:      General: He is not in acute distress.    Appearance: He is well-developed. He is not ill-appearing.  HENT:     Head: Normocephalic and atraumatic.     Nose: Nose normal.     Mouth/Throat:     Mouth: Mucous membranes are moist.  Eyes:     Extraocular Movements: Extraocular movements intact.     Conjunctiva/sclera: Conjunctivae normal.     Pupils: Pupils are equal, round, and reactive to light.  Cardiovascular:     Rate and Rhythm: Normal rate and regular rhythm.     Pulses: Normal pulses.     Heart sounds: Normal heart sounds. No murmur heard. Pulmonary:     Effort: Pulmonary effort is normal. No respiratory distress.     Breath sounds: Normal breath sounds.  Abdominal:     Palpations: Abdomen is soft.     Tenderness: There is no abdominal tenderness. There is right CVA tenderness.  Musculoskeletal:        General: No swelling.     Cervical back: Normal range of motion and neck supple.  Skin:    General: Skin is warm and dry.     Capillary Refill: Capillary refill takes less  than 2 seconds.  Neurological:     General: No focal deficit present.     Mental Status: He is alert and oriented to person, place, and time.     Cranial Nerves: No cranial nerve deficit.     Sensory: No sensory deficit.     Motor: No weakness.     Coordination: Coordination normal.  Psychiatric:        Mood and Affect: Mood normal.     (all labs ordered are listed, but only abnormal results are displayed) Labs Reviewed  COMPREHENSIVE METABOLIC PANEL WITH GFR - Abnormal; Notable for the following components:      Result Value   CO2 20 (*)    Glucose, Bld 120 (*)    BUN 24 (*)    Creatinine, Ser 2.02 (*)    GFR, Estimated 35 (*)    Anion gap 16 (*)    All other components within normal limits  CBC WITH DIFFERENTIAL/PLATELET - Abnormal; Notable for the following components:   WBC 15.9 (*)    Neutro Abs 13.5 (*)    Monocytes Absolute 1.1 (*)    All other components within normal limits  URINALYSIS, ROUTINE W REFLEX MICROSCOPIC - Abnormal; Notable for the following components:   Specific Gravity, Urine 1.035 (*)    Hgb urine dipstick MODERATE (*)    Ketones, ur 15 (*)    Protein, ur TRACE (*)    Leukocytes,Ua SMALL (*)    All other components within normal limits    EKG: None  Radiology: CT Renal Stone Study Result Date: 11/01/2023 EXAM: CT UROGRAM 11/01/2023 06:11:19 AM  TECHNIQUE: CT of the abdomen and pelvis was performed without the administration of intravenous contrast as per CT urogram protocol. Multiplanar reformatted images as well as MIP urogram images are provided for review. Automated exposure control, iterative reconstruction, and/or weight based adjustment of the mA/kV was utilized to reduce the radiation dose to as low as reasonably achievable. COMPARISON: 01/13/2023 CLINICAL HISTORY: Abdominal/flank pain, stone suspected. Patient reports RLQ pain that woke him up from his sleep approx 3 hours ago. States the pain radiates to his right lower back. Reports nausea +  vomiting. Last BM yesterday. FINDINGS: LOWER CHEST: No acute abnormality. LIVER: The liver is unremarkable. GALLBLADDER AND BILE DUCTS: Gallbladder is unremarkable. No biliary ductal dilatation. SPLEEN: No acute abnormality. PANCREAS: No acute abnormality. ADRENAL GLANDS: No acute abnormality. KIDNEYS, URETERS AND BLADDER: Right-sided hydronephrosis and perinephric fat stranding. There is right-sided hydroureter. Within the distal right ureter, proximal to the bladder, is a 3 mm stone, image 78/2. Bladder appears decompressed. GI AND BOWEL: Small hiatal hernia. Normal appendix. Extensive sigmoid diverticulosis with signs of chronic diverticular disease without evidence of acute diverticulitis. There is no bowel obstruction. PERITONEUM AND RETROPERITONEUM: No ascites. No free air. VASCULATURE: Aorta is normal in caliber. Aortic atherosclerotic calcification. LYMPH NODES: No lymphadenopathy. REPRODUCTIVE ORGANS: Mild prostate gland enlargement. BONES AND SOFT TISSUES: No acute osseous abnormality. Signs of previous bilateral inguinal herniorrhaphy. No focal soft tissue abnormality. IMPRESSION: 1. Right-sided hydronephrosis and hydroureter due to a 3 mm distal right ureteral stone, with associated perinephric fat stranding. 2. Several chronic and incidental findings, including extensive sigmoid diverticulosis without acute diverticulitis, aortic atherosclerosis, small hiatal hernia, mild prostatomegaly, and postsurgical changes of prior bilateral inguinal herniorrhaphy; see report body for details. Electronically signed by: Waddell Calk MD 11/01/2023 06:32 AM EDT RP Workstation: [id number]     Procedures   Medications Ordered in the ED  fentaNYL  (SUBLIMAZE ) injection 50 mcg (has no administration in time range)  ondansetron  (ZOFRAN ) injection 4 mg (4 mg Intravenous Given 11/01/23 1439)  HYDROmorphone  (DILAUDID ) injection 1 mg (1 mg Intravenous Given 11/01/23 1540)  sodium chloride  0.9 % bolus 1,000 mL (0 mLs  Intravenous Stopped 11/01/23 1712)  ondansetron  (ZOFRAN ) injection 4 mg (4 mg Intravenous Given 11/01/23 1558)  sodium chloride  0.9 % bolus 1,000 mL (1,000 mLs Intravenous New Bag/Given 11/01/23 1712)  prochlorperazine  (COMPAZINE ) injection 5 mg (5 mg Intravenous Given 11/01/23 1718)  diphenhydrAMINE  (BENADRYL ) injection 12.5 mg (12.5 mg Intravenous Given 11/01/23 1718)  ketorolac  (TORADOL ) 15 MG/ML injection 15 mg (15 mg Intravenous Given 11/01/23 1717)                                    Medical Decision Making Amount and/or Complexity of Data Reviewed Labs: ordered.  Risk Prescription drug management.   STAFFORD RIVIERA is here for ongoing pain from his kidney stone.  He was diagnosed with 3 mm kidney stone earlier this morning.  Vital signs unremarkable.  Will recheck labs and a urinalysis.  He denies any fever but he has been having uncontrollable nausea secondary to the pain cannot keep his meds down.  Will give him a dose of Dilaudid  Zofran  fluid bolus and check labs urinalysis and reevaluate.  Patient has mild leukocytosis creatinine mildly elevated to 2 but otherwise urinalysis negative for infection.  He felt much better after IV fluids IV narcotics and IV antiemetics.  He would like to continue outpatient treatment at home.  He  understands to return if he develops fever or worsening pain uncontrollable nausea vomiting.  I did recommend that he go to [facility] because at that time he may need intervention by urology team.  He is already been given referral.  Understands return precautions.  Discharge.  This chart was dictated using voice recognition software.  Despite best efforts to proofread,  errors can occur which can change the documentation meaning.      Final diagnoses:  Kidney stone    ED Discharge Orders     None          Ruthe Cornet, DO 11/01/23 1903

## 2023-11-01 NOTE — ED Triage Notes (Addendum)
 Patient reports RLQ pain that woke him up from his sleep approx 3 hours ago. States the pain radiates to his right lower back. Reports nausea + vomiting. Last BM yesterday.

## 2023-11-01 NOTE — ED Provider Notes (Signed)
 [place] EMERGENCY DEPARTMENT AT [facility] Provider Note   CSN: [id number] Arrival date & time: 11/01/23  0522     Patient presents with: Abdominal Pain   [name] is a 68 y.o. male.   Patient is a 68 year old male with history of hypertension, hyperlipidemia, aortic atherosclerosis.  Patient presenting today with complaints of right lower quadrant pain.  Symptoms started earlier this evening and came on suddenly.  This is associated with nausea and vomiting.  No fevers or chills.  He denies any bowel or bladder complaints.  He has had a kidney stone in the past but this feels somewhat different.       Prior to Admission medications   Medication Sig Start Date End Date Taking? Authorizing Provider  aspirin  EC 81 MG tablet 81 mg daily.     [provider]  atorvastatin  (LIPITOR) 80 MG tablet TAKE 1 TABLET BY MOUTH EVERY DAY FOR CHOLESTEROL 08/08/22   Cranford, Tonya, NP  Cholecalciferol (VITAMIN D ) 125 MCG (5000 UT) CAPS Take 1 capsule by mouth daily. 10,000 units    [provider]  esomeprazole  (NEXIUM ) 20 MG capsule Take 20 mg by mouth daily at 12 noon. Takes OTC 20 mg    [provider]  finasteride  (PROSCAR ) 5 MG tablet TAKE 1 TABLE TDAILY FOR PROSTATE Patient taking differently: Take .5 table tDaily for Prostate 12/23/21   Wilkinson, Dana E, NP  HYDROcodone -acetaminophen  (NORCO) 5-325 MG tablet Take 1 tablet by mouth every 6 (six) hours as needed. 01/11/23   Cranford, Tonya, NP  hydrOXYzine  (ATARAX ) 25 MG tablet Take  1 to 2 tablets  1 hour  before Bedtime  as needed for Sleep 03/05/21   Tonita Fallow, MD  lisinopril  (ZESTRIL ) 10 MG tablet TAKE 1 TABLET BY MOUTH DAILY FOR BLOOD PRESSURE 10/13/22   Wilkinson, Dana E, NP  MAGNESIUM  PO Take by mouth.    [provider]  meloxicam  (MOBIC ) 7.5 MG tablet Take  1 tablet  Daily  with Food for Pain & Inflammation                                                    /                                                              Take                        by                                mouth 01/10/22   Tonita Fallow, MD  ondansetron  (ZOFRAN ) 4 MG tablet Take 1 tablet (4 mg total) by mouth every 6 (six) hours. 01/13/23   Neysa Caron PARAS, DO  sildenafil  (REVATIO ) 20 MG tablet TAKE 1 TO 5 TABS BY MOUTH DAILY AS NEEDED. 10/14/16   Tonita Fallow, MD  traZODone  (DESYREL ) 150 MG tablet Take 1/2 to 1 or 2 tablets    1 to 2 hours     before Bedtime as needed  for Sleep 10/24/22   Tonita Fallow, MD    Allergies: Penicillins    Review of Systems  All other systems reviewed and are negative.   Updated Vital Signs BP (!) 164/90   Pulse 61   Temp 98.4 F (36.9 C)   Resp (!) 22   Ht 5' 10 (1.778 m)   Wt 86.2 kg   SpO2 100%   BMI 27.26 kg/m   Physical Exam Vitals and nursing note reviewed.  Constitutional:      General: He is not in acute distress.    Appearance: He is well-developed. He is not diaphoretic.  HENT:     Head: Normocephalic and atraumatic.  Cardiovascular:     Rate and Rhythm: Normal rate and regular rhythm.     Heart sounds: No murmur heard.    No friction rub.  Pulmonary:     Effort: Pulmonary effort is normal. No respiratory distress.     Breath sounds: Normal breath sounds. No wheezing or rales.  Abdominal:     General: Bowel sounds are normal. There is no distension.     Palpations: Abdomen is soft.     Tenderness: There is no abdominal tenderness. There is no right CVA tenderness, left CVA tenderness, guarding or rebound.  Musculoskeletal:        General: Normal range of motion.     Cervical back: Normal range of motion and neck supple.  Skin:    General: Skin is warm and dry.  Neurological:     Mental Status: He is alert and oriented to person, place, and time.     Coordination: Coordination normal.     (all labs ordered are listed, but only abnormal results are displayed) Labs Reviewed  CBC  COMPREHENSIVE METABOLIC PANEL WITH GFR   URINALYSIS, ROUTINE W REFLEX MICROSCOPIC    EKG: None  Radiology: No results found.   Procedures   Medications Ordered in the ED  morphine  (PF) 4 MG/ML injection 4 mg (has no administration in time range)  ketorolac  (TORADOL ) 30 MG/ML injection 30 mg (has no administration in time range)  ondansetron  (ZOFRAN ) injection 4 mg (has no administration in time range)                                    Medical Decision Making Amount and/or Complexity of Data Reviewed Labs: ordered. Radiology: ordered.  Risk Prescription drug management.   Patient presenting with right lower quadrant pain as described in the HPI.  Patient arrives with stable vital signs and is afebrile.  He is in extreme discomfort upon presentation, but physical examination basically unremarkable.  Abdomen is benign and there is no CVA tenderness.  Laboratory studies obtained including CBC, CMP, both of which are unremarkable.  CT scan with renal protocol obtained showing a 3 mm stone in the distal right ureter with right-sided hydronephrosis.  Patient has been given morphine  and Toradol  for pain and Zofran  for nausea and is feeling significantly improved.  He will be discharged with pain medication and follow-up with urology as needed, however do anticipate spontaneous passage of the stone.     Final diagnoses:  None    ED Discharge Orders     None          Geroldine Berg, MD 11/01/23 [phone]

## 2023-11-01 NOTE — Discharge Instructions (Signed)
 Continue follow-up outpatient with urology.  If you develop fever or worsening pain I recommend that you go to [facility] for repeat evaluation.  Continue your narcotic pain medicine as prescribed.

## 2023-12-01 ENCOUNTER — Ambulatory Visit: Attending: Internal Medicine | Admitting: Internal Medicine

## 2023-12-01 VITALS — BP 130/84 | HR 65 | Ht 70.0 in | Wt 193.2 lb

## 2023-12-01 DIAGNOSIS — E785 Hyperlipidemia, unspecified: Secondary | ICD-10-CM | POA: Diagnosis not present

## 2023-12-01 DIAGNOSIS — Z79899 Other long term (current) drug therapy: Secondary | ICD-10-CM

## 2023-12-01 MED ORDER — ROSUVASTATIN CALCIUM 20 MG PO TABS: 20.0000 mg | ORAL_TABLET | Freq: Every day | ORAL | 3 refills | Status: AC

## 2023-12-01 NOTE — Progress Notes (Signed)
 HPI Patient is a 68 yo with hx of CAD   He was last in cardiology clinic in 2015  Cardiac hx dates to  2005.  S/P acute coronary syndrome  LHC  LM normal; LAD large septal perforator with 90% prox stenosis; LCx patent  20 to 30% in om;  RCA with subtotal occlusion; distal vessel fills via collaterals.  Patient underwnt PCI with DES to  RCA  LVEF w 50 to 55%  Procedure complicated by DVT  2011  Admitted for CP    Stress test in 2011 showed inferior infarct with triv periinfarct ischeia.  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      atorvastatin  (LIPITOR) 80 MG tablet TAKE 1 TABLET BY MOUTH EVERY DAY FOR CHOLESTEROL 90 tablet 3   Cholecalciferol (VITAMIN D ) 125 MCG (5000 UT) CAPS Take 1 capsule by mouth daily. 10,000 units     esomeprazole  (NEXIUM ) 20 MG capsule Take 20 mg by mouth daily at 12 noon. Takes OTC 20 mg     finasteride  (PROSCAR ) 5 MG tablet TAKE 1 TABLE TDAILY FOR PROSTATE (Patient taking differently: Take .5 table tDaily for Prostate) 90 tablet 2   hydrOXYzine  (ATARAX ) 25 MG tablet Take  1 to 2 tablets  1 hour  before Bedtime  as needed for Sleep 180 tablet 3   lisinopril  (ZESTRIL ) 10 MG tablet TAKE 1 TABLET BY MOUTH DAILY FOR BLOOD PRESSURE 90 tablet 3   meloxicam  (MOBIC ) 7.5 MG tablet Take  1 tablet  Daily  with Food for Pain & Inflammation                                                    /                                                             Take                        by                                 mouth (Patient taking differently: Take 7.5 mg by mouth as needed. Take  1 tablet  Daily  with Food for Pain & Inflammation                                                    /  Take                        by                                mouth) 90 tablet 3   mirtazapine (REMERON) 7.5 MG tablet Take 7.5 mg by mouth as needed.     traZODone  (DESYREL ) 150 MG tablet Take 1/2 to 1 or 2 tablets    1 to 2 hours     before Bedtime as needed for Sleep 90 tablet 0   No current facility-administered medications for this visit.    Past Medical History:  Diagnosis Date   ANXIETY 12/25/2007   ANXIETY STATE NEC 11/01/2006   CORONARY ARTERY DISEASE 08/02/2006   off Plavix x 3 years   DIVERTICULITIS OF COLON 01/06/2010   DVT, HX OF 08/02/2006   GERD with stricture 08/02/2006   History of thyroiditis 12/15/2018   Hx of adenomatous polyp of colon 06/09/2010   HYPERLIPIDEMIA 08/02/2006   HYPERTENSION 08/05/2008   Hypothyroidism 03/19/2014   Memory loss 07/21/2009   Pilonidal cyst    low back pain   Prostatitis 09/16/2015   Recurrent inguinal hernia    right    Past Surgical History:  Procedure Laterality Date   COLONOSCOPY     COLONOSCOPY W/ POLYPECTOMY  06/16/2010   diminutive adenoma, diverticulosis, hemorrhoids   CORONARY ANGIOPLASTY WITH STENT PLACEMENT  2005   ESOPHAGOGASTRODUODENOSCOPY  06/16/2010   antral ulcers, esophageal stricture - dilated, duodenitis, hiatal hernia   HERNIA REPAIR Right    INGUINAL HERNIA REPAIR Right 11/05/2018   Procedure: OPEN RIGHT INGUINAL HERNIA REPAIR WITH MESH;  Surgeon: Vernetta Berg, MD;  Location: [facility];  Service: General;  Laterality: Right;  TAP BLOCK   PILONIDAL CYST EXCISION     teenager   POLYPECTOMY     UPPER GASTROINTESTINAL ENDOSCOPY      Family History  Problem Relation Age of Onset   Dementia Mother    Colon polyps Father    Colon cancer  Father        deceased age 40   Breast cancer Neg Hx    Esophageal cancer Neg Hx    Liver cancer Neg Hx    Pancreatic cancer Neg Hx    Rectal cancer Neg Hx    Stomach cancer Neg Hx     Social History   Socioeconomic History   Marital status: Married    Spouse name: Not on file   Number of children: 2   Years of education: Not on file   Highest education level: Not on file  Occupational History   Occupation: Production manager: [employer]   Tobacco Use   Smoking status: Never    Passive exposure: Past   Smokeless tobacco: Never  Vaping Use   Vaping status: Never Used  Substance and Sexual Activity   Alcohol use: Yes    Comment: approx 2 drinks per week   Drug use: Never   Sexual activity: Yes  Other Topics Concern   Not on file  Social History Narrative   Daily caffeine   Social Drivers of Corporate investment banker Strain: Not on file  Food Insecurity: Not on file  Transportation Needs: Not on file  Physical Activity: Not on file  Stress: Not on file  Social Connections: Not on file  Intimate  Partner Violence: Not on file    Review of Systems:  All systems reviewed.  They are negative to the above problem except as previously stated.  Vital Signs: BP 130/84   Pulse 65   Ht 5' 10 (1.778 m)   Wt 193 lb 3.2 oz (87.6 kg)   SpO2 97%   BMI 27.72 kg/m   Physical Exam  HEENT:  Normocephalic, atraumatic.   Neck: JVP is normal.  No bruits Lungs: clear to auscultation.  Heart: Regular rate and rhythm. Normal S1, S2. No S3.   No significant murmur=  Abdomen:  Supple, nontender  No masses. No hepatomegaly.  Extremities:   Good distal pulses throughout. No lower extremity edema.    EKG  Not done today  Assessment and Plan:  1.  CAD   Remote intervention to RCA   Last myoview  in 2015 without ischemia   Doing well  No angina     2  HTN  BP is adequately controlled  Follow   3 HL    Lastlipids in May     He stopped lipitor when it was  recalled     Recomm that he start Crestor 20  Will follow up with NMR panel in 8 wks with liver panel  4  Metabolics A1C was 6.2   Discussed diet  Needs to cut back on carbs  Stay active   Follow up end of next summer

## 2023-12-01 NOTE — Patient Instructions (Addendum)
 Medication Instructions:  Your physician has recommended you make the following change in your medication:   Atorvastatin  (Lipitor) has been removed from your medication list.  1) START rosuvastatin (Crestor) 20 mg once daily  *If you need a refill on your cardiac medications before your next appointment, please call your pharmacy*  Lab Work: In 8 weeks (mid December): fasting NMR, LFTs, Hgb A1c, LP(a), Apo B If you have labs (blood work) drawn today and your tests are completely normal, you will receive your results only by: MyChart Message (if you have MyChart) OR A paper copy in the mail If you have any lab test that is abnormal or we need to change your treatment, we will call you to review the results.  Follow-Up: At [facility], you and your health needs are our priority.  As part of our continuing mission to provide you with exceptional heart care, our providers are all part of one team.  This team includes your primary Cardiologist (physician) and Advanced Practice Providers or APPs (Physician Assistants and Nurse Practitioners) who all work together to provide you with the care you need, when you need it.  Your next appointment:   9 month(s)  Provider:   Vina Gull, MD  We recommend signing up for the patient portal called MyChart.  Sign up information is provided on this After Visit Summary.  MyChart is used to connect with patients for Virtual Visits (Telemedicine).  Patients are able to view lab/test results, encounter notes, upcoming appointments, etc.  Non-urgent messages can be sent to your provider as well.    To learn more about what you can do with MyChart, go to [email or web address].

## 2024-02-06 LAB — NMR, LIPOPROFILE
Cholesterol, Total: 137 mg/dL (ref 100–199)
HDL Particle Number: 35.1 umol/L
HDL-C: 55 mg/dL
LDL Particle Number: 918 nmol/L
LDL Size: 20.4 nm — AB
LDL-C (NIH Calc): 69 mg/dL (ref 0–99)
LP-IR Score: 46 — AB
Small LDL Particle Number: 575 nmol/L — AB
Triglycerides: 65 mg/dL (ref 0–149)

## 2024-02-06 LAB — HEPATIC FUNCTION PANEL
ALT: 16 IU/L (ref 0–44)
AST: 15 IU/L (ref 0–40)
Albumin: 4.3 g/dL (ref 3.9–4.9)
Alkaline Phosphatase: 59 IU/L (ref 47–123)
Bilirubin Total: 0.4 mg/dL (ref 0.0–1.2)
Bilirubin, Direct: 0.15 mg/dL (ref 0.00–0.40)
Total Protein: 6.2 g/dL (ref 6.0–8.5)

## 2024-02-06 LAB — APOLIPOPROTEIN B: Apolipoprotein B: 63 mg/dL

## 2024-02-06 LAB — HEMOGLOBIN A1C
Est. average glucose Bld gHb Est-mCnc: 120 mg/dL
Hgb A1c MFr Bld: 5.8 % — ABNORMAL HIGH (ref 4.8–5.6)

## 2024-02-06 LAB — LIPOPROTEIN A (LPA): Lipoprotein (a): 87 nmol/L — AB

## 2024-02-12 ENCOUNTER — Ambulatory Visit: Payer: Self-pay | Admitting: Internal Medicine
